# Patient Record
Sex: Male | Born: 1955 | Race: Black or African American | Hispanic: No | Marital: Single | Smoking: Former smoker
Health system: Southern US, Community
[De-identification: ages and names within clinical notes are randomized; demographics above are authoritative.]

## PROBLEM LIST (undated history)

## (undated) DIAGNOSIS — J449 Chronic obstructive pulmonary disease, unspecified: Secondary | ICD-10-CM

## (undated) HISTORY — PX: OTHER SURGICAL HISTORY: SHX169

---

## 2002-03-12 ENCOUNTER — Ambulatory Visit (HOSPITAL_COMMUNITY): Admission: RE | Admit: 2002-03-12 | Discharge: 2002-03-12 | Payer: Self-pay | Admitting: Family Medicine

## 2002-03-12 ENCOUNTER — Encounter: Payer: Self-pay | Admitting: Family Medicine

## 2002-03-26 ENCOUNTER — Ambulatory Visit (HOSPITAL_COMMUNITY): Admission: RE | Admit: 2002-03-26 | Discharge: 2002-03-26 | Payer: Self-pay | Admitting: Family Medicine

## 2002-03-26 ENCOUNTER — Encounter: Payer: Self-pay | Admitting: Family Medicine

## 2002-09-26 ENCOUNTER — Emergency Department (HOSPITAL_COMMUNITY): Admission: EM | Admit: 2002-09-26 | Discharge: 2002-09-26 | Payer: Self-pay | Admitting: Emergency Medicine

## 2008-01-09 ENCOUNTER — Emergency Department (HOSPITAL_COMMUNITY): Admission: EM | Admit: 2008-01-09 | Discharge: 2008-01-09 | Payer: Self-pay | Admitting: Emergency Medicine

## 2008-01-27 ENCOUNTER — Emergency Department (HOSPITAL_COMMUNITY): Admission: EM | Admit: 2008-01-27 | Discharge: 2008-01-27 | Payer: Self-pay | Admitting: Emergency Medicine

## 2008-06-17 IMAGING — CR DG CHEST 2V
2 series · 2 of 2 positions shown · non-contrast
Comparison: none

CLINICAL DATA: Shortness of breath, cough, congestion, smoker.  
CHEST - 2 VIEW:

[view not recorded (1 of 2)]
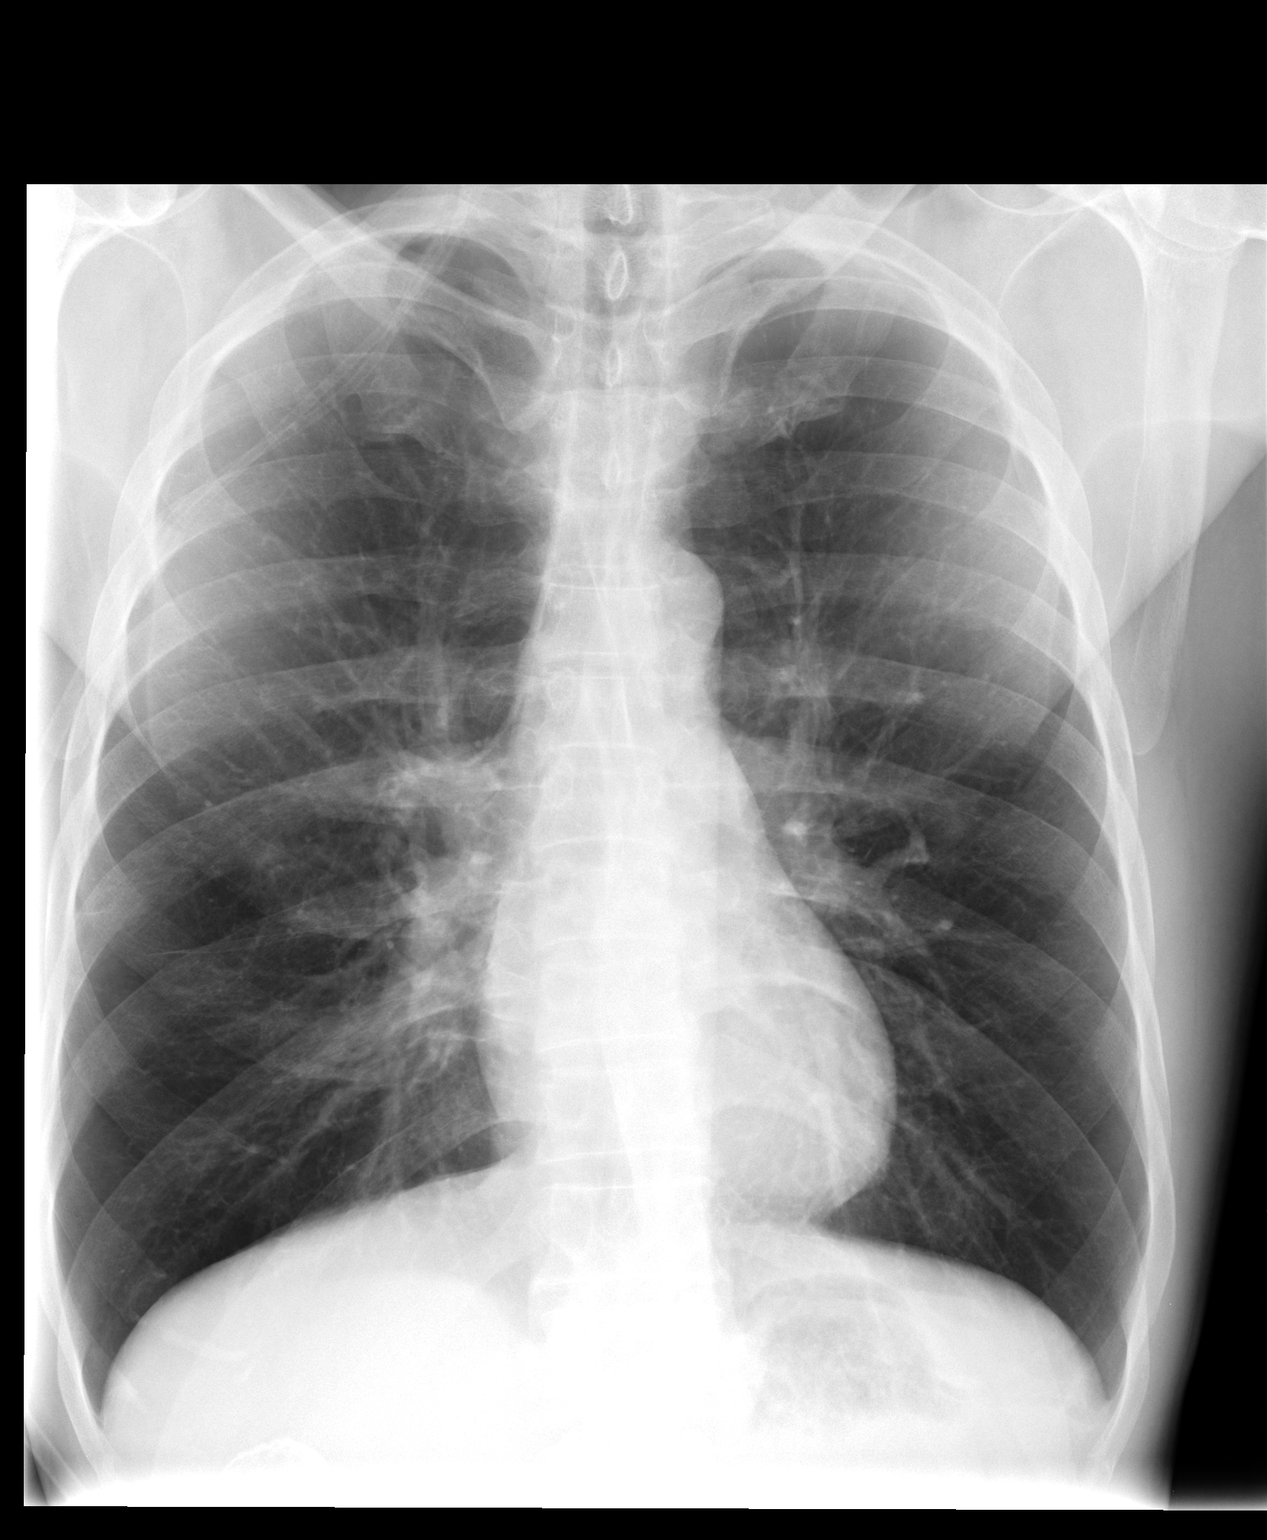

[view not recorded (2 of 2)]
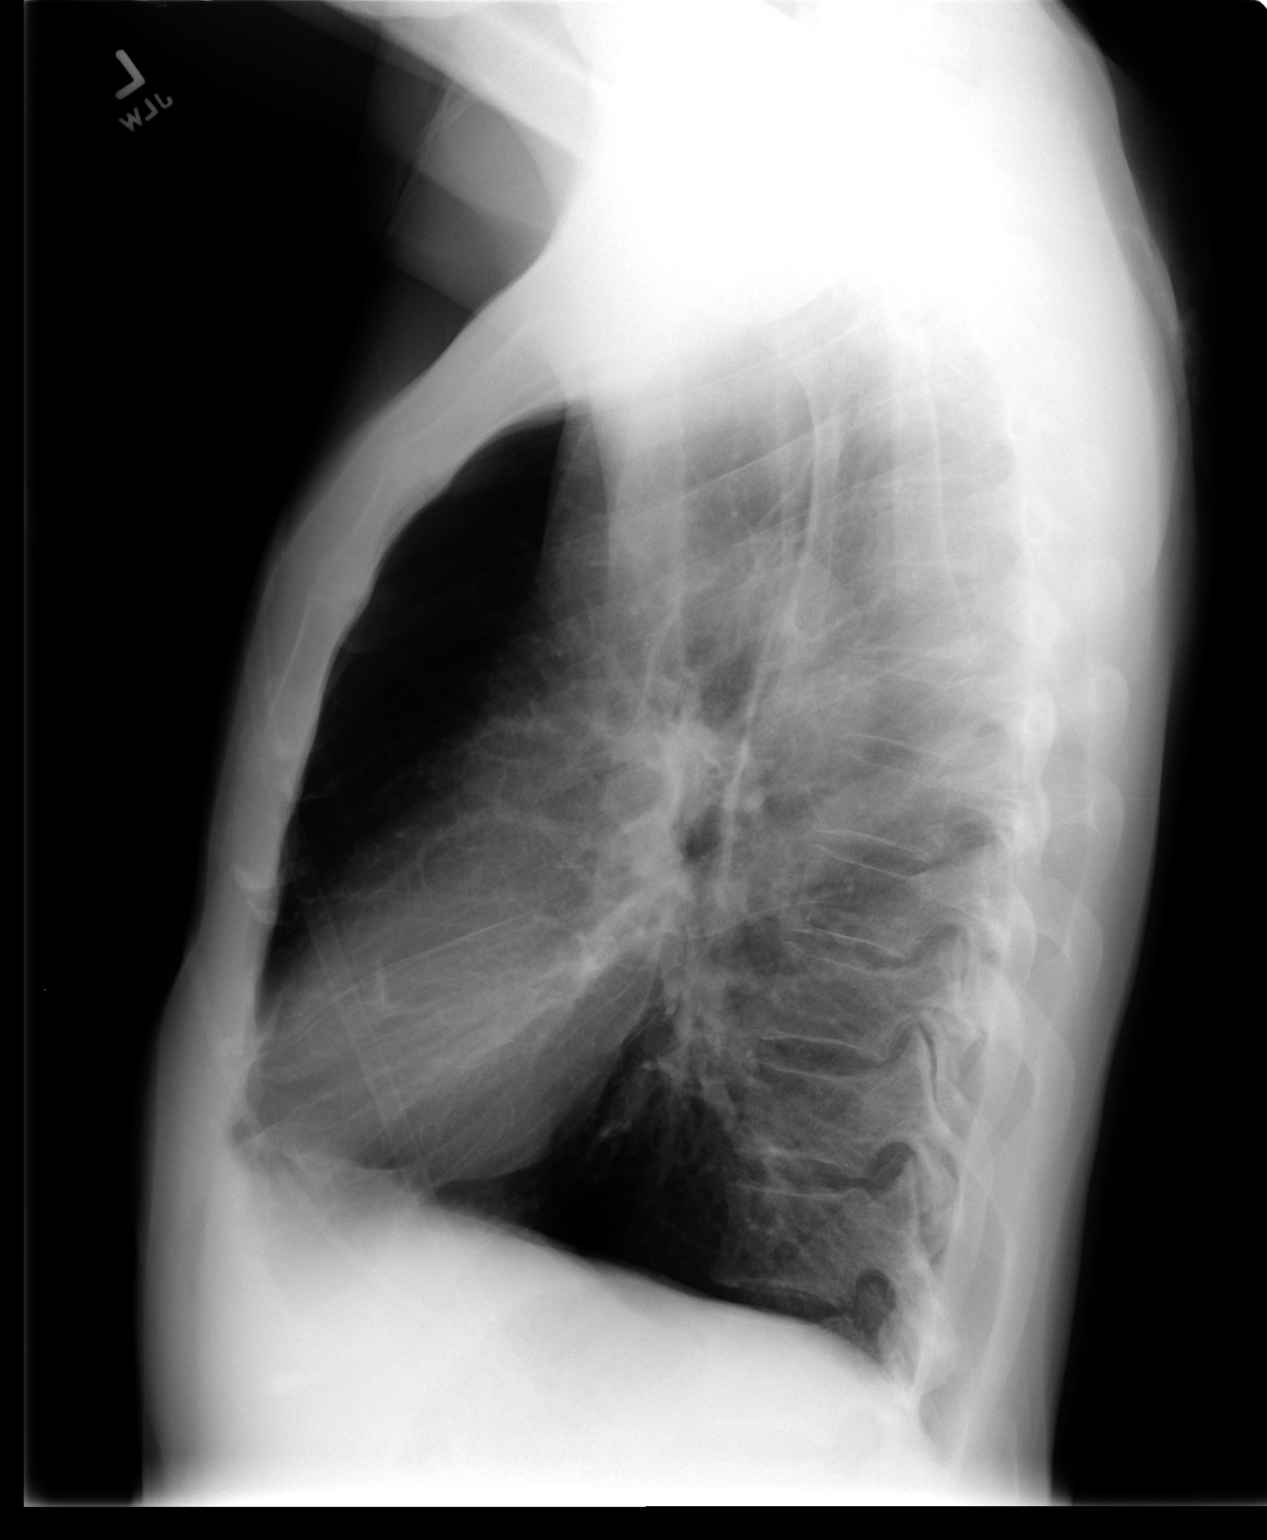

[2 of 2 positions shown; findings below may reference images not displayed]

FINDINGS: Severe hyperinflation consistent with COPD advanced for the patient?s age of 51.  Coarse peribronchial markings suggest chronic bronchitis.   I see no definite acute infiltrate, nodule, mass, effusion, pneumothorax, or edema.  The cardiac size is normal.   There is no definite adenopathy.  The bones are unremarkable.
IMPRESSION: COPD, no active disease.

## 2008-07-05 IMAGING — CR DG CHEST 1V PORT
1 series · 1 of 1 positions shown · non-contrast
Comparison: 01/09/08.

CLINICAL DATA: Short of breath.
 PORTABLE CHEST ? 01/27/08 AT 0026 HOURS:

[view not recorded]
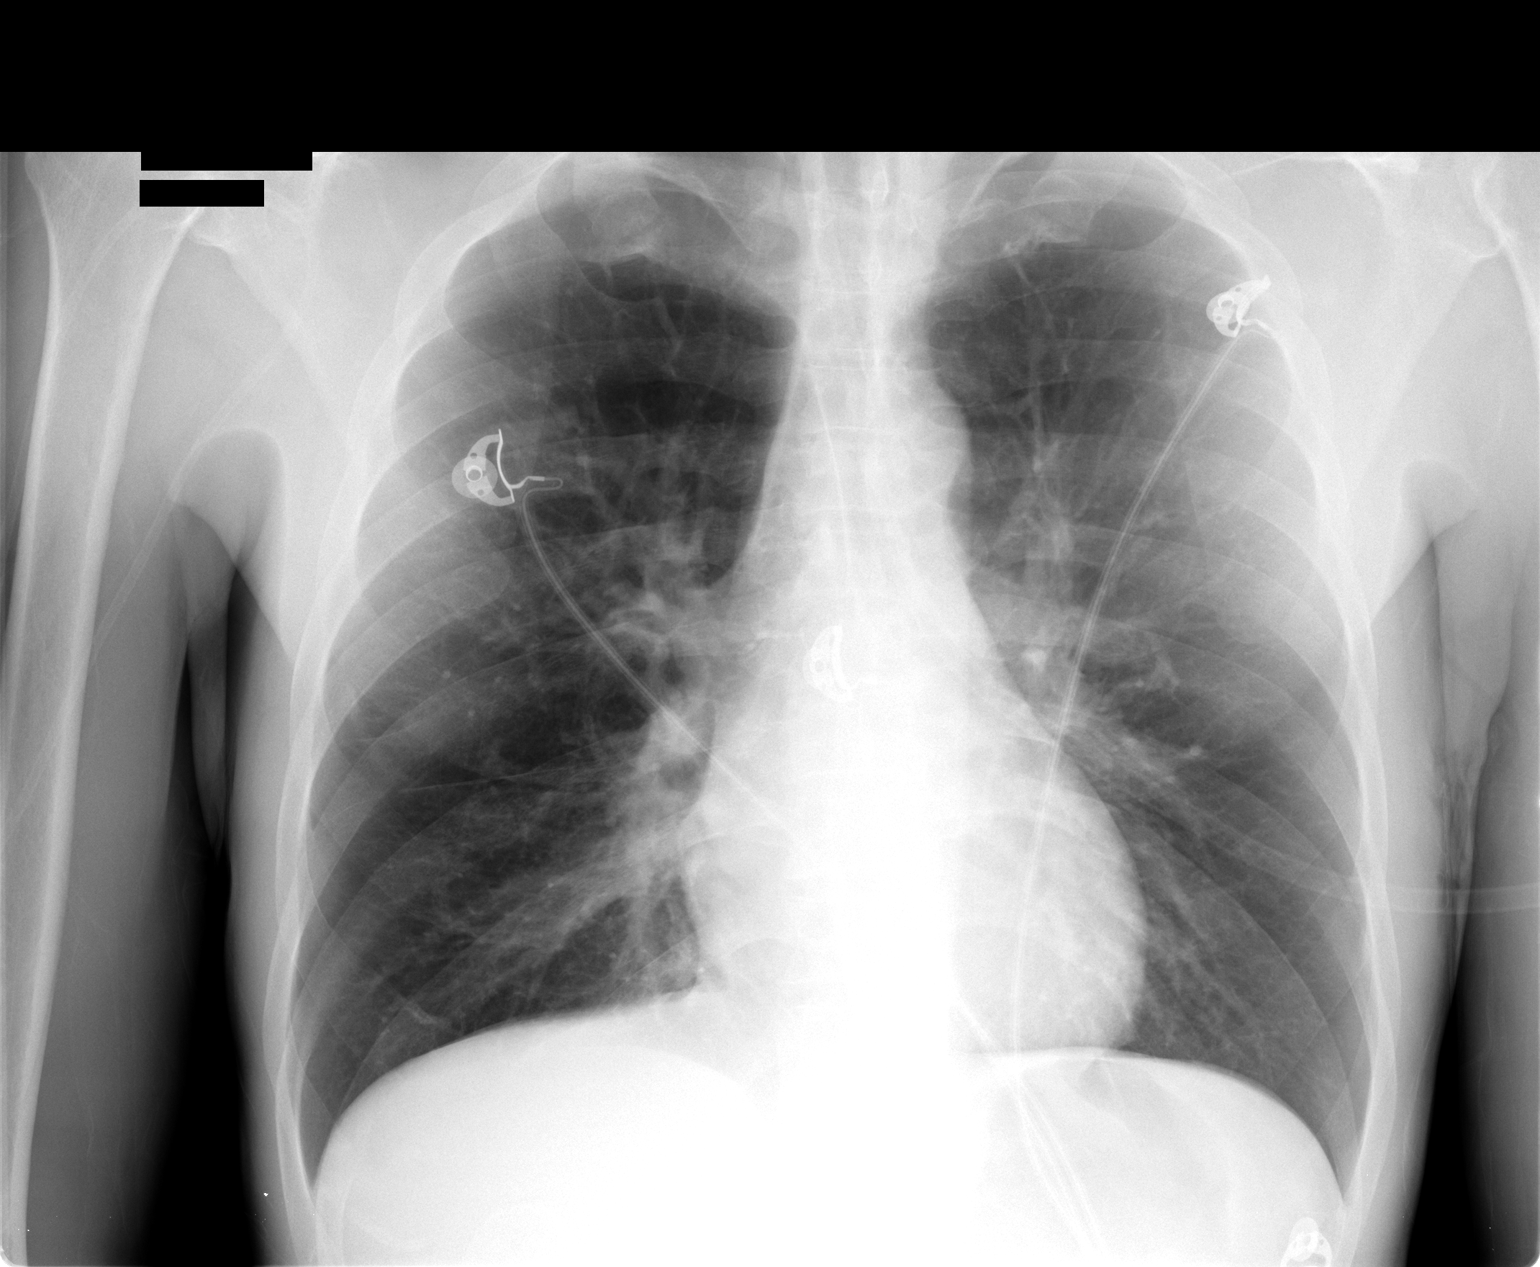

[1 of 1 positions shown; findings below may reference images not displayed]

FINDINGS: The lungs are clear but hyperaerated, consistent with COPD.  The heart is within normal limits in size.  No bony abnormality is seen.
IMPRESSION: COPD.  No active lung disease.

## 2011-09-02 LAB — DIFFERENTIAL
Basophils Absolute: 0.1
Eosinophils Relative: 3
Lymphocytes Relative: 52 — ABNORMAL HIGH
Neutrophils Relative %: 34 — ABNORMAL LOW

## 2011-09-02 LAB — CBC
HCT: 43.3
Platelets: 273
RDW: 16.1 — ABNORMAL HIGH

## 2011-09-02 LAB — BASIC METABOLIC PANEL
BUN: 8
CO2: 26
Calcium: 8.8
Chloride: 102
Creatinine, Ser: 0.91
GFR calc Af Amer: >60
GFR calc non Af Amer: >60
Glucose, Bld: 124 — ABNORMAL HIGH
Potassium: 3.3 — ABNORMAL LOW
Sodium: 136

## 2011-09-02 LAB — D-DIMER, QUANTITATIVE: D-Dimer, Quant: 0.22

## 2017-11-16 ENCOUNTER — Ambulatory Visit: Payer: BLUE CROSS/BLUE SHIELD | Admitting: General Surgery

## 2017-11-23 ENCOUNTER — Ambulatory Visit: Payer: BLUE CROSS/BLUE SHIELD | Admitting: General Surgery

## 2017-12-21 ENCOUNTER — Encounter: Payer: Self-pay | Admitting: General Surgery

## 2017-12-21 ENCOUNTER — Ambulatory Visit: Payer: BLUE CROSS/BLUE SHIELD | Admitting: General Surgery

## 2017-12-21 VITALS — BP 135/79 | HR 85 | Temp 97.3°F | Ht 69.0 in | Wt 150.0 lb

## 2017-12-21 DIAGNOSIS — D171 Benign lipomatous neoplasm of skin and subcutaneous tissue of trunk: Secondary | ICD-10-CM | POA: Diagnosis not present

## 2017-12-21 NOTE — Patient Instructions (Signed)
Lipoma A lipoma is a noncancerous (benign) tumor that is made up of fat cells. This is a very common type of soft-tissue growth. Lipomas are usually found under the skin (subcutaneous). They may occur in any tissue of the body that contains fat. Common areas for lipomas to appear include the back, shoulders, buttocks, and thighs. Lipomas grow slowly, and they are usually painless. Most lipomas do not cause problems and do not require treatment. What are the causes? The cause of this condition is not known. What increases the risk? This condition is more likely to develop in:  People who are 40-60 years old.  People who have a family history of lipomas.  What are the signs or symptoms? A lipoma usually appears as a small, round bump under the skin. It may feel soft or rubbery, but the firmness can vary. Most lipomas are not painful. However, a lipoma may become painful if it is located in an area where it pushes on nerves. How is this diagnosed? A lipoma can usually be diagnosed with a physical exam. You may also have tests to confirm the diagnosis and to rule out other conditions. Tests may include:  Imaging tests, such as a CT scan or MRI.  Removal of a tissue sample to be looked at under a microscope (biopsy).  How is this treated? Treatment is not needed for small lipomas that are not causing problems. If a lipoma continues to get bigger or it causes problems, removal is often the best option. Lipomas can also be removed to improve appearance. Removal of a lipoma is usually done with a surgery in which the fatty cells and the surrounding capsule are removed. Most often, a medicine that numbs the area (local anesthetic) is used for this procedure. Follow these instructions at home:  Keep all follow-up visits as directed by your health care provider. This is important. Contact a health care provider if:  Your lipoma becomes larger or hard.  Your lipoma becomes painful, red, or  increasingly swollen. These could be signs of infection or a more serious condition. This information is not intended to replace advice given to you by your health care provider. Make sure you discuss any questions you have with your health care provider. Document Released: 11/18/2002 Document Revised: 05/05/2016 Document Reviewed: 11/24/2014 Elsevier Interactive Patient Education  2018 [employer]  

## 2017-12-21 NOTE — Progress Notes (Signed)
Theodore [name]; [id number]; 05-31-1956   HPI  Patient is a 62 year old black male who was referred to my care by Dr. Karie Kirks for evaluation and treatment of a mass on his left buttock.  He was told that it was a lipoma.  He does not know how long it is been present.  He states he has not noticed any real increase in size since it was noted on physical examination.  He denies any pain.  He denies any discomfort.  It does not affect his daily lifestyle. History reviewed. No pertinent past medical history.  History reviewed. No pertinent surgical history.  History reviewed. No pertinent family history.  No current outpatient medications on file prior to visit.   No current facility-administered medications on file prior to visit.     Not on File  Social History   Substance and Sexual Activity  Alcohol Use Not on file    Social History   Tobacco Use  Smoking Status Former Smoker  Smokeless Tobacco Never Used    Review of Systems  Constitutional: Negative.   HENT: Negative.   Eyes: Negative.   Respiratory: Positive for cough and wheezing.   Cardiovascular: Negative.   Gastrointestinal: Negative.   Genitourinary: Negative.   Musculoskeletal: Positive for joint pain and neck pain.  Skin: Negative.   Neurological: Negative.   Endo/Heme/Allergies: Negative.   Psychiatric/Behavioral: Negative.     Objective   Vitals:   12/21/17 1002  BP: 135/79  Pulse: 85  Temp: (!) 97.3 F (36.3 C)    Physical Exam  Constitutional: He is oriented to person, place, and time and well-developed, well-nourished, and in no distress.  HENT:  Head: Normocephalic and atraumatic.  Cardiovascular: Normal rate, regular rhythm and normal heart sounds. Exam reveals no gallop and no friction rub.  No murmur heard. Pulmonary/Chest: Effort normal. No respiratory distress. He has no wheezes. He has no rales.  Neurological: He is alert and oriented to person, place, and time.  Skin: Skin is warm  and dry. No erythema.  Left buttock with 4 x 6 cm rubbery subcutaneous ovoid mass.  Nontender to touch.  No erythema or induration noted.  Vitals reviewed.   Assessment  Lipoma, left buttock, asymptomatic Plan   I told patient that no acute surgical intervention is needed at this time.  Should this become symptomatic or increase in size over a short period of time, he should return for excision.  Literature was given.  He understands and agrees.  Follow-up as needed.

## 2018-08-09 ENCOUNTER — Ambulatory Visit: Payer: BLUE CROSS/BLUE SHIELD | Admitting: General Surgery

## 2018-08-14 ENCOUNTER — Encounter: Payer: Self-pay | Admitting: General Surgery

## 2018-08-14 ENCOUNTER — Ambulatory Visit: Payer: BLUE CROSS/BLUE SHIELD | Admitting: General Surgery

## 2018-08-14 VITALS — BP 155/79 | HR 98 | Temp 96.8°F | Resp 20 | Wt 156.0 lb

## 2018-08-14 DIAGNOSIS — D171 Benign lipomatous neoplasm of skin and subcutaneous tissue of trunk: Secondary | ICD-10-CM

## 2018-08-14 NOTE — Patient Instructions (Signed)
Lipoma Removal Lipoma removal is a surgical procedure to remove a noncancerous (benign) tumor that is made up of fat cells (lipoma). Most lipomas are small and painless and do not require treatment. They can form in many areas of the body but are most common under the skin of the back, shoulders, arms, and thighs. You may need lipoma removal if you have a lipoma that is large, growing, or causing discomfort. Lipoma removal may also be done for cosmetic reasons. Tell a health care provider about:  Any allergies you have.  All medicines you are taking, including vitamins, herbs, eye drops, creams, and over-the-counter medicines.  Any problems you or family members have had with anesthetic medicines.  Any blood disorders you have.  Any surgeries you have had.  Any medical conditions you have.  Whether you are pregnant or may be pregnant. What are the risks? Generally, this is a safe procedure. However, problems may occur, including:  Infection.  Bleeding.  Allergic reactions to medicines.  Damage to nerves or blood vessels near the lipoma.  Scarring.  What happens before the procedure? Staying hydrated Follow instructions from your health care provider about hydration, which may include:  Up to 2 hours before the procedure - you may continue to drink clear liquids, such as water, clear fruit juice, black coffee, and plain tea.  Eating and drinking restrictions Follow instructions from your health care provider about eating and drinking, which may include:  8 hours before the procedure - stop eating heavy meals or foods such as meat, fried foods, or fatty foods.  6 hours before the procedure - stop eating light meals or foods, such as toast or cereal.  6 hours before the procedure - stop drinking milk or drinks that contain milk.  2 hours before the procedure - stop drinking clear liquids.  Medicines  Ask your health care provider about: ? Changing or stopping your  regular medicines. This is especially important if you are taking diabetes medicines or blood thinners. ? Taking medicines such as aspirin and ibuprofen. These medicines can thin your blood. Do not take these medicines before your procedure if your health care provider instructs you not to.  You may be given antibiotic medicine to help prevent infection. General instructions  Ask your health care provider how your surgical site will be marked or identified.  You will have a physical exam. Your health care provider will check the size of the lipoma and whether it can be moved easily.  You may have imaging tests, such as: ? X-rays. ? CT scan. ? MRI.  Plan to have someone take you home from the hospital or clinic. What happens during the procedure?  To reduce your risk of infection: ? Your health care team will wash or sanitize their hands. ? Your skin will be washed with soap.  You will be given one or more of the following: ? A medicine to help you relax (sedative). ? A medicine to numb the area (local anesthetic). ? A medicine to make you fall asleep (general anesthetic). ? A medicine that is injected into an area of your body to numb everything below the injection site (regional anesthetic).  An incision will be made over the lipoma or very near the lipoma. The incision may be made in a natural skin line or crease.  Tissues, nerves, and blood vessels near the lipoma will be moved out of the way.  The lipoma and the capsule that surrounds it will be separated  from the surrounding tissues.  The lipoma will be removed.  The incision may be closed with stitches (sutures).  A bandage (dressing) will be placed over the incision. What happens after the procedure?  Do not drive for 24 hours if you received a sedative.  Your blood pressure, heart rate, breathing rate, and blood oxygen level will be monitored until the medicines you were given have worn off. This information is not  intended to replace advice given to you by your health care provider. Make sure you discuss any questions you have with your health care provider. Document Released: 02/11/2016 Document Revised: 05/05/2016 Document Reviewed: 02/11/2016 Elsevier Interactive Patient Education  [employer].

## 2018-08-14 NOTE — Progress Notes (Signed)
Subjective:     Theodore [name]  Patient returns to schedule excision of the lipoma on his left buttock.  Is becoming more uncomfortable to sit due to the location of lipoma. Objective:    BP (!) 155/79 (BP Location: Left Arm, Patient Position: Sitting, Cuff Size: Normal)   Pulse 98   Temp (!) 96.8 F (36 C) (Temporal)   Resp 20   Wt 156 lb (70.8 kg)   BMI 23.04 kg/m   General:  alert, cooperative and no distress  Lungs clear to auscultation with good breath sounds bilaterally Heart examination reveals regular rate and rhythm without S3, S4, murmurs Left buttock with 4 x 6 cm lipoma.     Assessment:    Lipoma, left buttock    Plan:   Patient is scheduled for excision of the lipoma, left buttock on 08/27/2018.  The risks and benefits of the procedure including bleeding, infection, and the possibility of recurrence of the lipoma were fully explained to the patient, who gave informed consent.

## 2018-08-14 NOTE — H&P (Signed)
Theodore [name]; [id number]; [birth year]   HPI  Patient is a 62 year old black male who was referred to my care by Dr. Karie Kirks for evaluation and treatment of a mass on his left buttock.  He was told that it was a lipoma.  He does not know how long it is been present.  He states he has not noticed any real increase in size since it was noted on physical examination.  He denies any pain.  He denies any discomfort.  It does not affect his daily lifestyle. History reviewed. No pertinent past medical history.  History reviewed. No pertinent surgical history.  History reviewed. No pertinent family history.  No current outpatient medications on file prior to visit.   No current facility-administered medications on file prior to visit.     Not on File  Social History   Substance and Sexual Activity  Alcohol Use Not on file    Social History   Tobacco Use  Smoking Status Former Smoker  Smokeless Tobacco Never Used    Review of Systems  Constitutional: Negative.   HENT: Negative.   Eyes: Negative.   Respiratory: Positive for cough and wheezing.   Cardiovascular: Negative.   Gastrointestinal: Negative.   Genitourinary: Negative.   Musculoskeletal: Positive for joint pain and neck pain.  Skin: Negative.   Neurological: Negative.   Endo/Heme/Allergies: Negative.   Psychiatric/Behavioral: Negative.     Objective   Vitals:   12/21/17 1002  BP: 135/79  Pulse: 85  Temp: (!) 97.3 F (36.3 C)    Physical Exam  Constitutional: He is oriented to person, place, and time and well-developed, well-nourished, and in no distress.  HENT:  Head: Normocephalic and atraumatic.  Cardiovascular: Normal rate, regular rhythm and normal heart sounds. Exam reveals no gallop and no friction rub.  No murmur heard. Pulmonary/Chest: Effort normal. No respiratory distress. He has no wheezes. He has no rales.  Neurological: He is alert and oriented to person, place, and time.  Skin: Skin is warm  and dry. No erythema.  Left buttock with 4 x 6 cm rubbery subcutaneous ovoid mass.  Nontender to touch.  No erythema or induration noted.  Vitals reviewed.   Assessment  Lipoma, left buttock, asymptomatic Plan   Patient is scheduled for excision of the lipoma, left buttock on 08/27/2018.  The risks and benefits of the procedure including bleeding, infection, and recurrence of the lipoma were fully explained to the patient, who gave informed consent.

## 2018-08-17 NOTE — Patient Instructions (Signed)
Your procedure is scheduled on: 08/27/2018  Report to [facility] at   8:00  AM.  Call this number if you have problems the morning of surgery: [phone]   Remember:   Do not drink or eat food:After Midnight.  :  Take these medicines the morning of surgery with A SIP OF WATER: Albuterol neb if needed   Do not wear jewelry, make-up or nail polish.  Do not wear lotions, powders, or perfumes. You may wear deodorant.  Do not shave 48 hours prior to surgery. Men may shave face and neck.  Do not bring valuables to the hospital.  Contacts, dentures or bridgework may not be worn into surgery.  Leave suitcase in the car. After surgery it may be brought to your room.  For patients admitted to the hospital, checkout time is 11:00 AM the day of discharge.   Patients discharged the day of surgery will not be allowed to drive home.    Special Instructions: Shower using CHG night before surgery and shower the day of surgery use CHG.  Use special wash - you have one bottle of CHG for all showers.  You should use approximately 1/2 of the bottle for each shower.  Wound Care, Adult Taking care of your wound properly can help to prevent pain and infection. It can also help your wound to heal more quickly. How is this treated? Wound care  Follow instructions from your health care provider about how to take care of your wound. Make sure you: ? Wash your hands with soap and water before you change the bandage (dressing). If soap and water are not available, use hand sanitizer. ? Change your dressing as told by your health care provider. ? Leave stitches (sutures), skin glue, or adhesive strips in place. These skin closures may need to stay in place for 2 weeks or longer. If adhesive strip edges start to loosen and curl up, you may trim the loose edges. Do not remove adhesive strips completely unless your health care provider tells you to do that.  Check your wound area every day for signs of infection.  Check for: ? More redness, swelling, or pain. ? More fluid or blood. ? Warmth. ? Pus or a bad smell.  Ask your health care provider if you should clean the wound with mild soap and water. Doing this may include: ? Using a clean towel to pat the wound dry after cleaning it. Do not rub or scrub the wound. ? Applying a cream or ointment. Do this only as told by your health care provider. ? Covering the incision with a clean dressing.  Ask your health care provider when you can leave the wound uncovered. Medicines   If you were prescribed an antibiotic medicine, cream, or ointment, take or use the antibiotic as told by your health care provider. Do not stop taking or using the antibiotic even if your condition improves.  Take over-the-counter and prescription medicines only as told by your health care provider. If you were prescribed pain medicine, take it at least 30 minutes before doing any wound care or as told by your health care provider. General instructions  Return to your normal activities as told by your health care provider. Ask your health care provider what activities are safe.  Do not scratch or pick at the wound.  Keep all follow-up visits as told by your health care provider. This is important.  Eat a diet that includes protein, vitamin A, vitamin C,  and other nutrient-rich foods. These help the wound heal: ? Protein-rich foods include meat, dairy, beans, nuts, and other sources. ? Vitamin A-rich foods include carrots and dark green, leafy vegetables. ? Vitamin C-rich foods include citrus, tomatoes, and other fruits and vegetables. ? Nutrient-rich foods have protein, carbohydrates, fat, vitamins, or minerals. Eat a variety of healthy foods including vegetables, fruits, and whole grains. Contact a health care provider if:  You received a tetanus shot and you have swelling, severe pain, redness, or bleeding at the injection site.  Your pain is not controlled with  medicine.  You have more redness, swelling, or pain around the wound.  You have more fluid or blood coming from the wound.  Your wound feels warm to the touch.  You have pus or a bad smell coming from the wound.  You have a fever or chills.  You are nauseous or you vomit.  You are dizzy. Get help right away if:  You have a red streak going away from your wound.  The edges of the wound open up and separate.  Your wound is bleeding and the bleeding does not stop with gentle pressure.  You have a rash.  You faint.  You have trouble breathing. This information is not intended to replace advice given to you by your health care provider. Make sure you discuss any questions you have with your health care provider. Document Released: 09/06/2008 Document Revised: 07/27/2016 Document Reviewed: 06/14/2016 Elsevier Interactive Patient Education  2017 [place], Care After These instructions provide you with information about caring for yourself after your procedure. Your health care provider may also give you more specific instructions. Your treatment has been planned according to current medical practices, but problems sometimes occur. Call your health care provider if you have any problems or questions after your procedure. What can I expect after the procedure? After your procedure, it is common to:  Feel sleepy for several hours.  Feel clumsy and have poor balance for several hours.  Feel forgetful about what happened after the procedure.  Have poor judgment for several hours.  Feel nauseous or vomit.  Have a sore throat if you had a breathing tube during the procedure.  Follow these instructions at home: For at least 24 hours after the procedure:   Do not: ? Participate in activities in which you could fall or become injured. ? Drive. ? Use heavy machinery. ? Drink alcohol. ? Take sleeping pills or medicines that cause  drowsiness. ? Make important decisions or sign legal documents. ? Take care of children on your own.  Rest. Eating and drinking  Follow the diet that is recommended by your health care provider.  If you vomit, drink water, juice, or soup when you can drink without vomiting.  Make sure you have little or no nausea before eating solid foods. General instructions  Have a responsible adult stay with you until you are awake and alert.  Take over-the-counter and prescription medicines only as told by your health care provider.  If you smoke, do not smoke without supervision.  Keep all follow-up visits as told by your health care provider. This is important. Contact a health care provider if:  You keep feeling nauseous or you keep vomiting.  You feel light-headed.  You develop a rash.  You have a fever. Get help right away if:  You have trouble breathing. This information is not intended to replace advice given to you by your health care  provider. Make sure you discuss any questions you have with your health care provider. Document Released: 03/20/2016 Document Revised: 07/20/2016 Document Reviewed: 03/20/2016 Elsevier Interactive Patient Education  [employer].

## 2018-08-21 ENCOUNTER — Encounter (HOSPITAL_COMMUNITY): Payer: Self-pay

## 2018-08-21 ENCOUNTER — Encounter (HOSPITAL_COMMUNITY)
Admission: RE | Admit: 2018-08-21 | Discharge: 2018-08-21 | Disposition: A | Discharge status: Home / Self Care | Payer: BLUE CROSS/BLUE SHIELD | Source: Ambulatory Visit | Attending: General Surgery | Admitting: General Surgery

## 2018-08-21 DIAGNOSIS — Z01818 Encounter for other preprocedural examination: Secondary | ICD-10-CM | POA: Diagnosis present

## 2018-08-21 DIAGNOSIS — R9431 Abnormal electrocardiogram [ECG] [EKG]: Secondary | ICD-10-CM | POA: Diagnosis not present

## 2018-08-21 DIAGNOSIS — I10 Essential (primary) hypertension: Secondary | ICD-10-CM | POA: Insufficient documentation

## 2018-08-21 HISTORY — DX: Chronic obstructive pulmonary disease, unspecified: J44.9

## 2018-08-21 LAB — BASIC METABOLIC PANEL
ANION GAP: 8 (ref 5–15)
BUN: 11 mg/dL (ref 8–23)
CHLORIDE: 106 mmol/L (ref 98–111)
CO2: 25 mmol/L (ref 22–32)
Calcium: 9.1 mg/dL (ref 8.9–10.3)
Creatinine, Ser: 0.89 mg/dL (ref 0.61–1.24)
Glucose, Bld: 104 mg/dL — ABNORMAL HIGH (ref 70–99)
POTASSIUM: 3.8 mmol/L (ref 3.5–5.1)
Sodium: 139 mmol/L (ref 135–145)

## 2018-08-21 LAB — CBC
HCT: 43.9 % (ref 39.0–52.0)
HEMOGLOBIN: 14.7 g/dL (ref 13.0–17.0)
MCH: 29.9 pg (ref 26.0–34.0)
MCHC: 33.5 g/dL (ref 30.0–36.0)
MCV: 89.2 fL (ref 78.0–100.0)
PLATELETS: 243 10*3/uL (ref 150–400)
RBC: 4.92 MIL/uL (ref 4.22–5.81)
RDW: 14.5 % (ref 11.5–15.5)
WBC: 8.5 10*3/uL (ref 4.0–10.5)

## 2018-08-27 ENCOUNTER — Ambulatory Visit (HOSPITAL_COMMUNITY)
Admission: RE | Admit: 2018-08-27 | Discharge: 2018-08-27 | Disposition: A | Discharge status: Home / Self Care | Payer: BLUE CROSS/BLUE SHIELD | Source: Ambulatory Visit | Attending: General Surgery | Admitting: General Surgery

## 2018-08-27 ENCOUNTER — Ambulatory Visit (HOSPITAL_COMMUNITY): Payer: BLUE CROSS/BLUE SHIELD | Admitting: Anesthesiology

## 2018-08-27 ENCOUNTER — Encounter (HOSPITAL_COMMUNITY): Payer: Self-pay | Admitting: Anesthesiology

## 2018-08-27 ENCOUNTER — Encounter (HOSPITAL_COMMUNITY)
Admission: RE | Disposition: A | Discharge status: Home / Self Care | Payer: Self-pay | Source: Ambulatory Visit | Attending: General Surgery

## 2018-08-27 DIAGNOSIS — D171 Benign lipomatous neoplasm of skin and subcutaneous tissue of trunk: Secondary | ICD-10-CM | POA: Diagnosis not present

## 2018-08-27 DIAGNOSIS — R222 Localized swelling, mass and lump, trunk: Secondary | ICD-10-CM | POA: Diagnosis present

## 2018-08-27 DIAGNOSIS — Z87891 Personal history of nicotine dependence: Secondary | ICD-10-CM | POA: Diagnosis not present

## 2018-08-27 HISTORY — PX: MASS EXCISION: SHX2000

## 2018-08-27 SURGERY — EXCISION MASS
Anesthesia: General | Site: Buttocks | Laterality: Left

## 2018-08-27 MED ORDER — KETOROLAC TROMETHAMINE 30 MG/ML IJ SOLN
30.0000 mg | Freq: Once | INTRAMUSCULAR | Status: AC | PRN
  Administered 2018-08-27: 30 mg via INTRAVENOUS
  Filled 2018-08-27: qty 1

## 2018-08-27 MED ORDER — LIDOCAINE HCL (PF) 1 % IJ SOLN
INTRAMUSCULAR | Status: AC
Start: 2018-08-27 — End: ?
  Filled 2018-08-27: qty 5

## 2018-08-27 MED ORDER — FENTANYL CITRATE (PF) 100 MCG/2ML IJ SOLN
INTRAMUSCULAR | Status: AC
  Filled 2018-08-27: qty 2

## 2018-08-27 MED ORDER — LIDOCAINE HCL (PF) 1 % IJ SOLN
INTRAMUSCULAR | Status: DC | PRN
  Administered 2018-08-27: 6 mL

## 2018-08-27 MED ORDER — CHLORHEXIDINE GLUCONATE CLOTH 2 % EX PADS: 6.0000 | MEDICATED_PAD | Freq: Once | CUTANEOUS | Status: DC

## 2018-08-27 MED ORDER — PROPOFOL 10 MG/ML IV BOLUS
INTRAVENOUS | Status: DC | PRN
  Administered 2018-08-27: 200 mg via INTRAVENOUS

## 2018-08-27 MED ORDER — HYDROMORPHONE HCL 1 MG/ML IJ SOLN: 0.2500 mg | INTRAMUSCULAR | Status: DC | PRN

## 2018-08-27 MED ORDER — CEFAZOLIN SODIUM-DEXTROSE 2-4 GM/100ML-% IV SOLN
2.0000 g | INTRAVENOUS | Status: AC
  Administered 2018-08-27: 2 g via INTRAVENOUS

## 2018-08-27 MED ORDER — MIDAZOLAM HCL 2 MG/2ML IJ SOLN
INTRAMUSCULAR | Status: AC
  Filled 2018-08-27: qty 2

## 2018-08-27 MED ORDER — LACTATED RINGERS IV SOLN
INTRAVENOUS | Status: DC
  Administered 2018-08-27: 09:00:00 via INTRAVENOUS

## 2018-08-27 MED ORDER — PROPOFOL 10 MG/ML IV BOLUS
INTRAVENOUS | Status: AC
  Filled 2018-08-27: qty 20

## 2018-08-27 MED ORDER — FENTANYL CITRATE (PF) 100 MCG/2ML IJ SOLN
INTRAMUSCULAR | Status: DC | PRN
  Administered 2018-08-27: 50 ug via INTRAVENOUS
  Administered 2018-08-27: 25 ug via INTRAVENOUS

## 2018-08-27 MED ORDER — LIDOCAINE HCL (CARDIAC) PF 50 MG/5ML IV SOSY
PREFILLED_SYRINGE | INTRAVENOUS | Status: DC | PRN
  Administered 2018-08-27: 50 mg via INTRAVENOUS

## 2018-08-27 MED ORDER — LIDOCAINE HCL (PF) 1 % IJ SOLN
INTRAMUSCULAR | Status: AC
  Filled 2018-08-27: qty 30

## 2018-08-27 MED ORDER — HYDROCODONE-ACETAMINOPHEN 7.5-325 MG PO TABS: 1.0000 | ORAL_TABLET | Freq: Once | ORAL | Status: DC | PRN

## 2018-08-27 MED ORDER — CEFAZOLIN SODIUM-DEXTROSE 2-4 GM/100ML-% IV SOLN
INTRAVENOUS | Status: AC
  Filled 2018-08-27: qty 100

## 2018-08-27 MED ORDER — MEPERIDINE HCL 50 MG/ML IJ SOLN: 6.2500 mg | INTRAMUSCULAR | Status: DC | PRN

## 2018-08-27 MED ORDER — MIDAZOLAM HCL 5 MG/5ML IJ SOLN
INTRAMUSCULAR | Status: DC | PRN
  Administered 2018-08-27: 2 mg via INTRAVENOUS

## 2018-08-27 MED ORDER — ONDANSETRON HCL 4 MG/2ML IJ SOLN: 4.0000 mg | Freq: Once | INTRAMUSCULAR | Status: DC | PRN

## 2018-08-27 MED ORDER — SUCCINYLCHOLINE CHLORIDE 20 MG/ML IJ SOLN
INTRAMUSCULAR | Status: AC
  Filled 2018-08-27: qty 1

## 2018-08-27 MED ORDER — 0.9 % SODIUM CHLORIDE (POUR BTL) OPTIME
TOPICAL | Status: DC | PRN
  Administered 2018-08-27: 1000 mL

## 2018-08-27 SURGICAL SUPPLY — 32 items
CHLORAPREP W/TINT 10.5 ML (MISCELLANEOUS) ×3 IMPLANT
CLOTH BEACON ORANGE TIMEOUT ST (SAFETY) ×3 IMPLANT
COVER LIGHT HANDLE STERIS (MISCELLANEOUS) ×6 IMPLANT
DECANTER SPIKE VIAL GLASS SM (MISCELLANEOUS) ×3 IMPLANT
DERMABOND ADVANCED (GAUZE/BANDAGES/DRESSINGS) ×2
DERMABOND ADVANCED .7 DNX12 (GAUZE/BANDAGES/DRESSINGS) ×1 IMPLANT
ELECT NEEDLE TIP 2.8 STRL (NEEDLE) IMPLANT
ELECT REM PT RETURN 9FT ADLT (ELECTROSURGICAL) ×3
ELECTRODE REM PT RTRN 9FT ADLT (ELECTROSURGICAL) ×1 IMPLANT
GAUZE SPONGE 4X4 12PLY STRL (GAUZE/BANDAGES/DRESSINGS) ×3 IMPLANT
GLOVE BIO SURGEON STRL SZ7 (GLOVE) ×3 IMPLANT
GLOVE BIOGEL PI IND STRL 6.5 (GLOVE) ×1 IMPLANT
GLOVE BIOGEL PI IND STRL 7.0 (GLOVE) ×1 IMPLANT
GLOVE BIOGEL PI INDICATOR 6.5 (GLOVE) ×2
GLOVE BIOGEL PI INDICATOR 7.0 (GLOVE) ×2
GLOVE SURG SS PI 7.5 STRL IVOR (GLOVE) ×3 IMPLANT
GOWN STRL REUS W/ TWL XL LVL3 (GOWN DISPOSABLE) ×1 IMPLANT
GOWN STRL REUS W/TWL LRG LVL3 (GOWN DISPOSABLE) ×3 IMPLANT
GOWN STRL REUS W/TWL XL LVL3 (GOWN DISPOSABLE) ×2
KIT TURNOVER KIT A (KITS) ×3 IMPLANT
MANIFOLD NEPTUNE II (INSTRUMENTS) ×3 IMPLANT
NEEDLE HYPO 25X1 1.5 SAFETY (NEEDLE) ×3 IMPLANT
NS IRRIG 1000ML POUR BTL (IV SOLUTION) ×3 IMPLANT
PACK MINOR (CUSTOM PROCEDURE TRAY) ×3 IMPLANT
PAD ARMBOARD 7.5X6 YLW CONV (MISCELLANEOUS) ×3 IMPLANT
SET BASIN LINEN APH (SET/KITS/TRAYS/PACK) ×3 IMPLANT
SUT ETHILON 3 0 FSL (SUTURE) IMPLANT
SUT MNCRL AB 4-0 PS2 18 (SUTURE) ×3 IMPLANT
SUT PROLENE 4 0 PS 2 18 (SUTURE) IMPLANT
SUT VIC AB 3-0 SH 27 (SUTURE) ×2
SUT VIC AB 3-0 SH 27X BRD (SUTURE) ×1 IMPLANT
SYR CONTROL 10ML LL (SYRINGE) ×3 IMPLANT

## 2018-08-27 NOTE — Transfer of Care (Signed)
Immediate Anesthesia Transfer of Care Note  Patient: DEMETRIAS GOODBAR  Procedure(s) Performed: EXCISION LIPOMA LEFT BUTTOCK (Left Buttocks)  Patient Location: PACU  Anesthesia Type:General  Level of Consciousness: awake  Airway & Oxygen Therapy: Patient Spontanous Breathing  Post-op Assessment: Report given to RN and Post -op Vital signs reviewed and stable  Post vital signs: Reviewed and stable  Last Vitals:  Vitals Value Taken Time  BP    Temp    Pulse 68 08/27/2018  9:51 AM  Resp    SpO2 100 % 08/27/2018  9:51 AM  Vitals shown include unvalidated device data.  Last Pain:  Vitals:   08/27/18 0812  TempSrc: Oral  PainSc: 0-No pain      Patients Stated Pain Goal: 5 (97/94/80 1655)  Complications: No apparent anesthesia complications

## 2018-08-27 NOTE — Discharge Instructions (Signed)
Lipoma Removal, Care After  Refer to this sheet in the next few weeks. These instructions provide you with information about caring for yourself after your procedure. Your health care provider may also give you more specific instructions. Your treatment has been planned according to current medical practices, but problems sometimes occur. Call your health care provider if you have any problems or questions after your procedure.  What can I expect after the procedure?  After the procedure, it is common to have:  · Mild pain.  · Swelling.  · Bruising.    Follow these instructions at home:    Bathing  · Do not take baths, swim, or use a hot tub until your health care provider approves. Ask your health care provider if you can take showers. You may only be allowed to take sponge baths for bathing.  · Keep your bandage (dressing) dry until your health care provider says it can be removed.  Incision care    · Follow instructions from your health care provider about how to take care of your incision. Make sure you:  ? Wash your hands with soap and water before you change your bandage (dressing). If soap and water are not available, use hand sanitizer.  ? Change your dressing as told by your health care provider.  ? Leave stitches (sutures), skin glue, or adhesive strips in place. These skin closures may need to stay in place for 2 weeks or longer. If adhesive strip edges start to loosen and curl up, you may trim the loose edges. Do not remove adhesive strips completely unless your health care provider tells you to do that.  · Check your incision area every day for signs of infection. Check for:  ? More redness, swelling, or pain.  ? Fluid or blood.  ? Warmth.  ? Pus or a bad smell.  Driving  · Do not drive or operate heavy machinery while taking prescription pain medicine.  · Do not drive for 24 hours if you received a medicine to help you relax (sedative) during your procedure.  · Ask your health care provider when it is  safe for you to drive.  General instructions  · Take over-the-counter and prescription medicines only as told by your health care provider.  · Do not use any tobacco products, such as cigarettes, chewing tobacco, and e-cigarettes. These can delay healing. If you need help quitting, ask your health care provider.  · Return to your normal activities as told by your health care provider. Ask your health care provider what activities are safe for you.  · Keep all follow-up visits as told by your health care provider. This is important.  Contact a health care provider if:  · You have more redness, swelling, or pain around your incision.  · You have fluid or blood coming from your incision.  · Your incision feels warm to the touch.  · You have pus or a bad smell coming from your incision.  · You have pain that does not get better with medicine.  Get help right away if:  · You have chills or a fever.  · You have severe pain.  This information is not intended to replace advice given to you by your health care provider. Make sure you discuss any questions you have with your health care provider.  Document Released: 02/11/2016 Document Revised: 05/10/2016 Document Reviewed: 02/11/2016  Elsevier Interactive Patient Education © 2018 [employer]

## 2018-08-27 NOTE — Anesthesia Preprocedure Evaluation (Signed)
Anesthesia Evaluation  Patient identified by MRN, date of birth, ID band Patient awake    Reviewed: Allergy & Precautions, H&P , NPO status , Patient's Chart, lab work & pertinent test results  Airway Mallampati: I  TM Distance: >3 FB Neck ROM: full    Dental no notable dental hx.    Pulmonary neg pulmonary ROS, COPD, former smoker,    Pulmonary exam normal breath sounds clear to auscultation       Cardiovascular Exercise Tolerance: Good negative cardio ROS   Rhythm:regular Rate:Normal     Neuro/Psych negative neurological ROS  negative psych ROS   GI/Hepatic negative GI ROS, Neg liver ROS,   Endo/Other  negative endocrine ROS  Renal/GU negative Renal ROS  negative genitourinary   Musculoskeletal   Abdominal   Peds  Hematology negative hematology ROS (+)   Anesthesia Other Findings   Reproductive/Obstetrics negative OB ROS                             Anesthesia Physical Anesthesia Plan  ASA: II  Anesthesia Plan: General   Post-op Pain Management:    Induction:   PONV Risk Score and Plan:   Airway Management Planned:   Additional Equipment:   Intra-op Plan:   Post-operative Plan:   Informed Consent: I have reviewed the patients History and Physical, chart, labs and discussed the procedure including the risks, benefits and alternatives for the proposed anesthesia with the patient or authorized representative who has indicated his/her understanding and acceptance.   Dental Advisory Given  Plan Discussed with: CRNA  Anesthesia Plan Comments:         Anesthesia Quick Evaluation

## 2018-08-27 NOTE — Anesthesia Procedure Notes (Signed)
Procedure Name: LMA Insertion Date/Time: 08/27/2018 9:06 AM Performed by: Ollen Bowl, CRNA Pre-anesthesia Checklist: Patient identified, Patient being monitored, Emergency Drugs available, Timeout performed and Suction available Patient Re-evaluated:Patient Re-evaluated prior to induction Oxygen Delivery Method: Circle System Utilized Preoxygenation: Pre-oxygenation with 100% oxygen Induction Type: IV induction Ventilation: Mask ventilation without difficulty LMA: LMA inserted LMA Size: 4.0 Number of attempts: 1 Placement Confirmation: positive ETCO2 and breath sounds checked- equal and bilateral

## 2018-08-27 NOTE — Op Note (Signed)
Patient:  Theodore [name]  DOB:  02-Oct-1956  MRN:  [id number]   Preop Diagnosis: Soft tissue mass, left buttock  Postop Diagnosis: Same, lipoma  Procedure: Excision of greater than 5 cm subcutaneous mass, left buttock  Surgeon: Aviva Signs, MD  Anes: General  Indications: Patient is a 62 year old black male who presents with an enlarging and tender soft tissue mass in the left buttock.  The risks and benefits of the procedure including bleeding, infection, and the possibility of recurrence of the mass were fully explained to the patient, who gave informed consent.  Procedure note: The patient was placed in the right lateral decubitus position after general anesthesia was administered.  The left buttock was prepped and draped using the usual sterile technique with DuraPrep.  Surgical site confirmation was performed.  A longitudinal incision was made over the subcutaneous mass.  The dissection was taken down to the subcutaneous tissue.  A large lipoma of the soft tissue was found.  This was removed in total without difficulty.  It was sent to pathology for further examination.  Any bleeding was controlled using Bovie electrocautery.  The subcutaneous layer was reapproximated using a 3-0 Vicryl interrupted suture.  The skin was closed using a 4-0 Monocryl subcuticular suture.  1% Xylocaine was infused into the skin.  The skin incision was closed using Dermabond.  All tape and needle counts were correct at the end of the procedure.  The patient was awakened and transferred to PACU in stable condition.  Complications: None  EBL: Minimal  Specimen: Lipoma

## 2018-08-27 NOTE — Anesthesia Postprocedure Evaluation (Signed)
Anesthesia Post Note  Patient: Theodore [name]  Procedure(s) Performed: EXCISION LIPOMA LEFT BUTTOCK (Left Buttocks)  Patient location during evaluation: PACU Anesthesia Type: General Level of consciousness: awake and alert and oriented Pain management: pain level controlled Vital Signs Assessment: post-procedure vital signs reviewed and stable Respiratory status: spontaneous breathing Cardiovascular status: blood pressure returned to baseline and stable Postop Assessment: no apparent nausea or vomiting Anesthetic complications: no     Last Vitals:  Vitals:   08/27/18 1000 08/27/18 1026  BP: 121/88 (!) 156/95  Pulse: 72 65  Resp: 18   Temp:    SpO2: 100% 93%    Last Pain:  Vitals:   08/27/18 1026  TempSrc:   PainSc: 0-No pain                 August Gosser

## 2018-08-27 NOTE — Interval H&P Note (Signed)
History and Physical Interval Note:  08/27/2018 8:53 AM  Theodore [name]  has presented today for surgery, with the diagnosis of lipoma left buttock  The various methods of treatment have been discussed with the patient and family. After consideration of risks, benefits and other options for treatment, the patient has consented to  Procedure(s): EXCISION LIPOMA LEFT BUTTOCK (Left) as a surgical intervention .  The patient's history has been reviewed, patient examined, no change in status, stable for surgery.  I have reviewed the patient's chart and labs.  Questions were answered to the patient's satisfaction.     Aviva Signs

## 2018-08-28 ENCOUNTER — Encounter (HOSPITAL_COMMUNITY): Payer: Self-pay | Admitting: General Surgery

## 2018-09-04 ENCOUNTER — Ambulatory Visit (INDEPENDENT_AMBULATORY_CARE_PROVIDER_SITE_OTHER): Payer: Self-pay | Admitting: General Surgery

## 2018-09-04 ENCOUNTER — Encounter: Payer: Self-pay | Admitting: General Surgery

## 2018-09-04 VITALS — BP 142/81 | HR 87 | Temp 97.1°F | Resp 18 | Wt 153.0 lb

## 2018-09-04 DIAGNOSIS — Z09 Encounter for follow-up examination after completed treatment for conditions other than malignant neoplasm: Secondary | ICD-10-CM

## 2018-09-04 NOTE — Progress Notes (Signed)
Subjective:     Theodore [name]  Status post excision of lipoma on the left buttock.  Patient doing very well.  Has no complaints. Objective:    BP (!) 142/81 (BP Location: Left Arm, Patient Position: Sitting, Cuff Size: Normal)   Pulse 87   Temp (!) 97.1 F (36.2 C) (Temporal)   Resp 18   Wt 153 lb (69.4 kg)   BMI 22.59 kg/m   General:  alert, cooperative and no distress  Left buttock incision well-healed.  No seroma present. Final pathology consistent with diagnosis.     Assessment:    Doing well postoperatively.    Plan:   Follow-up as needed.

## 2022-03-04 ENCOUNTER — Ambulatory Visit
Admission: EM | Admit: 2022-03-04 | Discharge: 2022-03-04 | Disposition: A | Discharge status: Home / Self Care | Payer: Medicare Other | Attending: Student | Admitting: Student

## 2022-03-04 ENCOUNTER — Other Ambulatory Visit: Payer: Self-pay

## 2022-03-04 DIAGNOSIS — L03211 Cellulitis of face: Secondary | ICD-10-CM

## 2022-03-04 MED ORDER — DOXYCYCLINE HYCLATE 100 MG PO CAPS: 100.0000 mg | ORAL_CAPSULE | Freq: Two times a day (BID) | ORAL | 0 refills | Status: AC

## 2022-03-04 NOTE — ED Provider Notes (Signed)
?[place] ? ? ? ?CSN: [id number] ?Arrival date & time: 03/04/22  1026 ? ? ?  ? ?History   ?Chief Complaint ?Chief Complaint  ?Patient presents with  ? Facial Swelling  ? ? ?HPI ?Theodore [name] is a 66 y.o. male presenting with concern for right-sided facial swelling following working outside.  History noncontributory.  States he attempted Benadryl without relief.  Has also applied a warm compress and cleansed with hydrogen peroxide.  Denies fever/chills, vision changes. Does not have diabetes. ? ?HPI ? ?Past Medical History:  ?Diagnosis Date  ? COPD (chronic obstructive pulmonary disease) ([place])   ? ? ?Patient Active Problem List  ? Diagnosis Date Noted  ? Lipoma of buttock   ? ? ?Past Surgical History:  ?Procedure Laterality Date  ? excision of cyst left ankle    ? MASS EXCISION Left 08/27/2018  ? Procedure: EXCISION LIPOMA LEFT BUTTOCK;  Surgeon: Aviva Signs, MD;  Location: [facility];  Service: General;  Laterality: Left;  ? ? ? ? ? ?Home Medications   ? ?Prior to Admission medications   ?Medication Sig Start Date End Date Taking? Authorizing Provider  ?doxycycline (VIBRAMYCIN) 100 MG capsule Take 1 capsule (100 mg total) by mouth 2 (two) times daily for 7 days. 03/04/22 03/11/22 Yes Hazel Sams, PA-C  ?albuterol (PROVENTIL HFA;VENTOLIN HFA) 108 (90 Base) MCG/ACT inhaler Inhale 2 puffs into the lungs every 4 (four) hours as needed for wheezing or shortness of breath.    [provider]  ?HYDROcodone-acetaminophen (NORCO) 7.5-325 MG tablet Take 1 tablet by mouth 4 (four) times daily as needed. 07/30/18   [provider]  ?SYMBICORT 160-4.5 MCG/ACT inhaler Inhale 1 puff into the lungs 2 (two) times daily as needed. 07/17/18   [provider]  ? ? ?Family History ?History reviewed. No pertinent family history. ? ?Social History ?Social History  ? ?Tobacco Use  ? Smoking status: Former  ?  Types: Cigarettes  ?  Quit date: 2008  ?  Years since quitting: 15.2  ? Smokeless tobacco:  Never  ?Vaping Use  ? Vaping Use: Never used  ?Substance Use Topics  ? Alcohol use: Not Currently  ? Drug use: Not Currently  ? ? ? ?Allergies   ?Maalox [calcium carbonate antacid] ? ? ?Review of Systems ?Review of Systems  ?Skin:  Positive for color change.  ?All other systems reviewed and are negative. ? ? ?Physical Exam ?Triage Vital Signs ?ED Triage Vitals  ?Enc Vitals Group  ?   BP 03/04/22 1125 133/85  ?   Pulse Rate 03/04/22 1125 84  ?   Resp 03/04/22 1125 18  ?   Temp 03/04/22 1125 98.1 ?F (36.7 ?C)  ?   Temp Source 03/04/22 1125 Oral  ?   SpO2 03/04/22 1125 96 %  ?   Weight --   ?   Height --   ?   Head Circumference --   ?   Peak Flow --   ?   Pain Score 03/04/22 1128 7  ?   Pain Loc --   ?   Pain Edu? --   ?   Excl. in [place]? --   ? ?No data found. ? ?Updated Vital Signs ?BP 133/85 (BP Location: Right Arm)   Pulse 84   Temp 98.1 ?F (36.7 ?C) (Oral)   Resp 18   SpO2 96%  ? ?Visual Acuity ?Right Eye Distance:   ?Left Eye Distance:   ?Bilateral Distance:   ? ?Right  Eye Near:   ?Left Eye Near:    ?Bilateral Near:    ? ?Physical Exam ?Vitals reviewed.  ?Constitutional:   ?   General: He is not in acute distress. ?   Appearance: Normal appearance. He is not ill-appearing.  ?HENT:  ?   Head: Normocephalic and atraumatic.  ?Pulmonary:  ?   Effort: Pulmonary effort is normal.  ?Skin: ?   Comments: See image below ?R cheek with 3x3cm area of induration, mild erythema, and warmth. Minimally TTP. Lower lid with trace effusion. No upper lid changes. No lip, tongue, uvula swelling; airway is patent.  ?Neurological:  ?   General: No focal deficit present.  ?   Mental Status: He is alert and oriented to person, place, and time.  ?Psychiatric:     ?   Mood and Affect: Mood normal.     ?   Behavior: Behavior normal.     ?   Thought Content: Thought content normal.     ?   Judgment: Judgment normal.  ? ? ? ? ?UC Treatments / Results  ?Labs ?(all labs ordered are listed, but only abnormal results are displayed) ?Labs  Reviewed - No data to display ? ?EKG ? ? ?Radiology ?No results found. ? ?Procedures ?Procedures (including critical care time) ? ?Medications Ordered in UC ?Medications - No data to display ? ?Initial Impression / Assessment and Plan / UC Course  ?I have reviewed the triage vital signs and the nursing notes. ? ?Pertinent labs & imaging results that were available during my care of the patient were reviewed by me and considered in my medical decision making (see chart for details). ? ?  ? ?This patient is a very pleasant 66 y.o. year old man presenting with cellulitis R cheek. Afebrile, nontachy. Doxycycline sent. ED return precautions discussed. Patient verbalizes understanding and agreement.  ?.  ? ?Final Clinical Impressions(s) / UC Diagnoses  ? ?Final diagnoses:  ?Cellulitis of right external cheek  ? ? ? ?Discharge Instructions   ? ?  ?-Doxycycline twice daily for 7 days.  Make sure to wear sunscreen while spending time outside while on this medication as it can increase your chance of sunburn. You can take this medication with food if you have a sensitive stomach. ?-Wash your area with gentle soap and water 1-2 times daily.  Let air dry or gently pat. Avoid cleansing with hydrogen peroxide or alcohol!! ?-Seek additional medical attention if the area is getting worse instead of better- redness increasing in size, pain getting worse, new/worsening discharge, new fevers/chills, etc. ? ? ? ?ED Prescriptions   ? ? Medication Sig Dispense Auth. Provider  ? doxycycline (VIBRAMYCIN) 100 MG capsule Take 1 capsule (100 mg total) by mouth 2 (two) times daily for 7 days. 14 capsule Hazel Sams, PA-C  ? ?  ? ?PDMP not reviewed this encounter. ?  ?Hazel Sams, PA-C ?03/04/22 1237 ? ?

## 2022-03-04 NOTE — ED Triage Notes (Signed)
Pt states about 3 days ago the right side of his face and below both eyes are swollen from working outside ? ?Pt states he tried Benadryl to stop the itching under his eyes ? ?Denies fever ?

## 2022-03-04 NOTE — Discharge Instructions (Addendum)
-  Doxycycline twice daily for 7 days.  Make sure to wear sunscreen while spending time outside while on this medication as it can increase your chance of sunburn. You can take this medication with food if you have a sensitive stomach. ?-Wash your area with gentle soap and water 1-2 times daily.  Let air dry or gently pat. Avoid cleansing with hydrogen peroxide or alcohol!! ?-Seek additional medical attention if the area is getting worse instead of better- redness increasing in size, pain getting worse, new/worsening discharge, new fevers/chills, etc. ? ?

## 2023-03-15 ENCOUNTER — Ambulatory Visit (HOSPITAL_COMMUNITY)
Admission: RE | Admit: 2023-03-15 | Discharge: 2023-03-15 | Disposition: A | Discharge status: Home / Self Care | Payer: Medicare Other | Source: Ambulatory Visit | Attending: Family Medicine | Admitting: Family Medicine

## 2023-03-15 ENCOUNTER — Other Ambulatory Visit (HOSPITAL_COMMUNITY): Payer: Self-pay | Admitting: Family Medicine

## 2023-03-15 DIAGNOSIS — R0789 Other chest pain: Secondary | ICD-10-CM | POA: Diagnosis not present

## 2023-03-17 ENCOUNTER — Other Ambulatory Visit (HOSPITAL_COMMUNITY): Payer: Self-pay | Admitting: Family Medicine

## 2023-03-17 ENCOUNTER — Ambulatory Visit (HOSPITAL_COMMUNITY)
Admission: RE | Admit: 2023-03-17 | Discharge: 2023-03-17 | Disposition: A | Discharge status: Home / Self Care | Payer: Medicare Other | Source: Ambulatory Visit | Attending: Family Medicine | Admitting: Family Medicine

## 2023-03-17 ENCOUNTER — Encounter (HOSPITAL_COMMUNITY): Payer: Self-pay

## 2023-03-17 DIAGNOSIS — J90 Pleural effusion, not elsewhere classified: Secondary | ICD-10-CM | POA: Insufficient documentation

## 2023-03-17 DIAGNOSIS — I700 Atherosclerosis of aorta: Secondary | ICD-10-CM | POA: Diagnosis not present

## 2023-03-17 DIAGNOSIS — Z1289 Encounter for screening for malignant neoplasm of other sites: Secondary | ICD-10-CM | POA: Diagnosis present

## 2023-03-17 DIAGNOSIS — K769 Liver disease, unspecified: Secondary | ICD-10-CM | POA: Insufficient documentation

## 2023-03-17 DIAGNOSIS — J439 Emphysema, unspecified: Secondary | ICD-10-CM | POA: Diagnosis not present

## 2023-03-17 MED ORDER — IOHEXOL 300 MG/ML  SOLN
100.0000 mL | Freq: Once | INTRAMUSCULAR | Status: AC | PRN
  Administered 2023-03-17: 75 mL via INTRAVENOUS

## 2023-03-27 ENCOUNTER — Other Ambulatory Visit: Payer: Self-pay

## 2023-03-27 DIAGNOSIS — C3490 Malignant neoplasm of unspecified part of unspecified bronchus or lung: Secondary | ICD-10-CM

## 2023-03-27 NOTE — Progress Notes (Signed)
PET scan and brain MRI ordered per Dr. Ellin Saba

## 2023-03-30 ENCOUNTER — Other Ambulatory Visit: Payer: Self-pay

## 2023-03-30 ENCOUNTER — Emergency Department (HOSPITAL_COMMUNITY)
Admission: EM | Admit: 2023-03-30 | Discharge: 2023-03-30 | Disposition: A | Discharge status: Home / Self Care | Payer: Medicare Other | Attending: Emergency Medicine | Admitting: Emergency Medicine

## 2023-03-30 ENCOUNTER — Other Ambulatory Visit: Payer: Self-pay | Admitting: Hematology

## 2023-03-30 ENCOUNTER — Encounter (HOSPITAL_COMMUNITY): Payer: Self-pay | Admitting: Emergency Medicine

## 2023-03-30 ENCOUNTER — Other Ambulatory Visit: Payer: Medicare Other

## 2023-03-30 ENCOUNTER — Emergency Department (HOSPITAL_COMMUNITY)
Admission: RE | Admit: 2023-03-30 | Discharge: 2023-03-30 | Disposition: A | Discharge status: Home / Self Care | Payer: Medicare Other | Source: Ambulatory Visit | Attending: Hematology | Admitting: Hematology

## 2023-03-30 DIAGNOSIS — C799 Secondary malignant neoplasm of unspecified site: Secondary | ICD-10-CM | POA: Insufficient documentation

## 2023-03-30 DIAGNOSIS — G9389 Other specified disorders of brain: Secondary | ICD-10-CM | POA: Insufficient documentation

## 2023-03-30 DIAGNOSIS — C719 Malignant neoplasm of brain, unspecified: Secondary | ICD-10-CM | POA: Insufficient documentation

## 2023-03-30 DIAGNOSIS — Z85118 Personal history of other malignant neoplasm of bronchus and lung: Secondary | ICD-10-CM | POA: Insufficient documentation

## 2023-03-30 DIAGNOSIS — R9402 Abnormal brain scan: Secondary | ICD-10-CM | POA: Diagnosis present

## 2023-03-30 DIAGNOSIS — C3490 Malignant neoplasm of unspecified part of unspecified bronchus or lung: Secondary | ICD-10-CM

## 2023-03-30 MED ORDER — GADOBUTROL 1 MMOL/ML IV SOLN
7.0000 mL | Freq: Once | INTRAVENOUS | Status: AC | PRN
  Administered 2023-03-30: 7 mL via INTRAVENOUS

## 2023-03-30 NOTE — Discharge Instructions (Signed)
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  Follow-up with your oncologist to discuss further treatment.  Return to the emergency room if you start having symptoms of severe headache, confusion, numbness or weakness

## 2023-03-30 NOTE — ED Provider Notes (Signed)
[place] EMERGENCY DEPARTMENT AT [facility] Provider Note   CSN: [id number] Arrival date & time: 03/30/23  1025     History  Chief complaint: abnormal MRI  Theodore [name] is a 67 y.o. male.  HPI   Patient has a history of lung cancer.  This was a recent diagnosis earlier this month.  Patient had an abnormal outpatient chest x-ray.  A follow-up CT scan with contrast was ordered by his primary care doctor.  It ultimately showed a right lobe mass.  Patient was also noted to have lytic lesions in the scapula.  He also had lesions noted in the liver.  Patient was referred to oncology Dr. Ellin Saba.  Patient states he had an outpatient MRI performed today.  The MRI technician noted findings that were concerning for a stroke.  Patient was then sent to the ED for further evaluation.  Patient denies any trouble with headaches.  No confusion.  No slurred speech.  No numbness or weakness.  He denies any specific complaints today at all  Home Medications Prior to Admission medications   Medication Sig Start Date End Date Taking? Authorizing Provider  albuterol (PROVENTIL HFA;VENTOLIN HFA) 108 (90 Base) MCG/ACT inhaler Inhale 2 puffs into the lungs every 4 (four) hours as needed for wheezing or shortness of breath.   Yes [provider]  HYDROcodone-acetaminophen (NORCO) 7.5-325 MG tablet Take 1 tablet by mouth 4 (four) times daily as needed. 07/30/18  Yes [provider]  SYMBICORT 160-4.5 MCG/ACT inhaler Inhale 1 puff into the lungs 2 (two) times daily as needed. 07/17/18  Yes [provider]      Allergies    Maalox [calcium carbonate antacid]    Review of Systems   Review of Systems  Physical Exam Updated Vital Signs BP (!) 138/90 (BP Location: Right Arm)   Pulse 87   Temp 98.5 F (36.9 C) (Oral)   Resp (!) 22   Ht 1.753 m ( )   Wt 61.2 kg   SpO2 97%   BMI 19.94 kg/m  Physical Exam Vitals and nursing note reviewed.  Constitutional:       General: He is not in acute distress.    Appearance: He is well-developed.  HENT:     Head: Normocephalic and atraumatic.     Right Ear: External ear normal.     Left Ear: External ear normal.  Eyes:     General: No visual field deficit or scleral icterus.       Right eye: No discharge.        Left eye: No discharge.     Conjunctiva/sclera: Conjunctivae normal.  Neck:     Trachea: No tracheal deviation.  Cardiovascular:     Rate and Rhythm: Normal rate and regular rhythm.  Pulmonary:     Effort: Pulmonary effort is normal. No respiratory distress.     Breath sounds: Normal breath sounds. No stridor. No wheezing or rales.  Abdominal:     General: Bowel sounds are normal. There is no distension.     Palpations: Abdomen is soft.     Tenderness: There is no abdominal tenderness. There is no guarding or rebound.  Musculoskeletal:        General: No tenderness.     Cervical back: Neck supple.  Skin:    General: Skin is warm and dry.     Findings: No rash.  Neurological:     Mental Status: He is alert and oriented to person, place, and  time.     Cranial Nerves: No cranial nerve deficit, dysarthria or facial asymmetry.     Sensory: No sensory deficit.     Motor: No abnormal muscle tone, seizure activity or pronator drift.     Coordination: Coordination normal.     Comments:  able to hold both legs off bed for 5 seconds, sensation intact in all extremities,  no left or right sided neglect, normal finger-nose exam bilaterally, no nystagmus noted   Psychiatric:        Mood and Affect: Mood normal.     ED Results / Procedures / Treatments   Labs (all labs ordered are listed, but only abnormal results are displayed) Labs Reviewed - No data to display  EKG None  Radiology MR Brain W Wo Contrast  Result Date: 03/30/2023 CLINICAL DATA:  Non-small cell lung cancer (NSCLC), staging EXAM: MRI HEAD WITHOUT AND WITH CONTRAST MRA HEAD WITHOUT CONTRAST TECHNIQUE: Multiplanar, multi-echo  pulse sequences of the brain and surrounding structures were acquired without and with intravenous contrast. Angiographic images of the Circle of Willis were acquired using MRA technique without intravenous contrast. CONTRAST:  7mL GADAVIST GADOBUTROL 1 MMOL/ML IV SOLN COMPARISON:  None Available. FINDINGS: MRI HEAD FINDINGS Brain: Findings compatible with metastatic disease with multiple peripherally enhancing lesions. This includes a peripherally enhancing 1.5 x 1.7 cm lesion in the left frontal lobe (series 23, image 105). Complex peripherally enhancing 1.5 cm lesion more superiorly along the high left frontal lobe near the vertex (image 124). Approximately 5 mm peripherally enhancing lesion in the right frontal lobe (image 116). Probable peripherally enhancing 5 mm lesion in the right occipital lobe (image 51) and in the posterior left temporal lobe (image 53). Approximately 8 mm enhancing lesion in the left parietal lobe (series 23, image 108). Possible punctate lesion in the right frontal operculum (image 83) and parasagittal right frontal lobe (image 117). Ill-defined enhancing lesion in the right temporal lobe (image 71). Possible punctate enhancing lesion in the superior cerebellum (image 53). No midline shift, acute hemorrhage, acute infarct or hydrocephalus. Vascular: Major arterial flow voids are maintained at the skull base. Skull and upper cervical spine: Normal marrow signal. Sinuses/Orbits: Paranasal sinus mucosal thickening. No acute orbital findings. Other: No mastoid effusions. MRA HEAD FINDINGS Anterior circulation: Bilateral intracranial ICAs, MCAs, and ACAs are patent without proximal high-grade stenosis. Posterior circulation: Bilateral intradural vertebral arteries, basilar artery and bilateral posterior cerebral arteries are patent without proximal hemodynamically significant stenosis. IMPRESSION: MRI: Many enhancing lesions in the brain, detailed above and compatible with metastatic  disease. The dominant/largest lesions are in the left frontal lobe. MRA: No large vessel occlusion or proximal high-grade stenosis. Electronically Signed   By: Feliberto Harts M.D.   On: 03/30/2023 10:58   MR ANGIO HEAD WO CONTRAST  Result Date: 03/30/2023 CLINICAL DATA:  Non-small cell lung cancer (NSCLC), staging EXAM: MRI HEAD WITHOUT AND WITH CONTRAST MRA HEAD WITHOUT CONTRAST TECHNIQUE: Multiplanar, multi-echo pulse sequences of the brain and surrounding structures were acquired without and with intravenous contrast. Angiographic images of the Circle of Willis were acquired using MRA technique without intravenous contrast. CONTRAST:  7mL GADAVIST GADOBUTROL 1 MMOL/ML IV SOLN COMPARISON:  None Available. FINDINGS: MRI HEAD FINDINGS Brain: Findings compatible with metastatic disease with multiple peripherally enhancing lesions. This includes a peripherally enhancing 1.5 x 1.7 cm lesion in the left frontal lobe (series 23, image 105). Complex peripherally enhancing 1.5 cm lesion more superiorly along the high left frontal lobe near the vertex (image  124). Approximately 5 mm peripherally enhancing lesion in the right frontal lobe (image 116). Probable peripherally enhancing 5 mm lesion in the right occipital lobe (image 51) and in the posterior left temporal lobe (image 53). Approximately 8 mm enhancing lesion in the left parietal lobe (series 23, image 108). Possible punctate lesion in the right frontal operculum (image 83) and parasagittal right frontal lobe (image 117). Ill-defined enhancing lesion in the right temporal lobe (image 71). Possible punctate enhancing lesion in the superior cerebellum (image 53). No midline shift, acute hemorrhage, acute infarct or hydrocephalus. Vascular: Major arterial flow voids are maintained at the skull base. Skull and upper cervical spine: Normal marrow signal. Sinuses/Orbits: Paranasal sinus mucosal thickening. No acute orbital findings. Other: No mastoid effusions.  MRA HEAD FINDINGS Anterior circulation: Bilateral intracranial ICAs, MCAs, and ACAs are patent without proximal high-grade stenosis. Posterior circulation: Bilateral intradural vertebral arteries, basilar artery and bilateral posterior cerebral arteries are patent without proximal hemodynamically significant stenosis. IMPRESSION: MRI: Many enhancing lesions in the brain, detailed above and compatible with metastatic disease. The dominant/largest lesions are in the left frontal lobe. MRA: No large vessel occlusion or proximal high-grade stenosis. Electronically Signed   By: Feliberto Harts M.D.   On: 03/30/2023 10:58    Procedures Procedures    Medications Ordered in ED Medications - No data to display  ED Course/ Medical Decision Making/ A&P Clinical Course as of 03/30/23 1214  Thu Mar 30, 2023  1120 I reviewed the patient's MRI and MRA that he had earlier today.  He does not have any evidence of an acute stroke.  He has evidence of metastatic disease associated with his lung cancer. [JK]    Clinical Course User Index [JK] Linwood Dibbles, MD                             Medical Decision Making Problems Addressed: Metastatic malignant neoplasm, unspecified site: chronic illness or injury with exacerbation, progression, or side effects of treatment  Amount and/or Complexity of Data Reviewed Radiology: independent interpretation performed.   Patient was sent to the ED because of abnormal MRI findings.  Patient was undergoing an MRI ordered by his oncologist Dr. Ellin Saba for workup as part of his newly diagnosed lung cancer evaluation..  The MRI technician reviewed the films and thought the patient had an acute stroke.  The patient was then sent to the ED for further evaluation.  Patient is asymptomatic and denies any acute complaints.  The formal reading by the radiologist however indicates that the patient has metastatic brain lesions associated with his lung cancer and not an acute stroke.   Discussed the case with Dr. Ellin Saba.  Patient is not having any symptoms and there is no edema.  Will follow-up with him in his office        Final Clinical Impression(s) / ED Diagnoses Final diagnoses:  Metastatic malignant neoplasm, unspecified site    Rx / DC Orders ED Discharge Orders     None         Linwood Dibbles, MD 03/30/23 1234

## 2023-03-30 NOTE — ED Triage Notes (Signed)
Pt was in MRI brain for stroke like symptoms. MRI showed stroke. Radiology sent him over for a work up

## 2023-03-31 ENCOUNTER — Other Ambulatory Visit: Payer: Self-pay

## 2023-03-31 DIAGNOSIS — C3490 Malignant neoplasm of unspecified part of unspecified bronchus or lung: Secondary | ICD-10-CM

## 2023-03-31 NOTE — Progress Notes (Signed)
Theodore Overlie, MD  Leodis Rains D PROCEDURE / BIOPSY REVIEW Date: 03/31/23  Requested Biopsy site: Lung biopsy requested BUT schedule CT guided right scapula lesion biopsy Reason for request: Metastatic disease Imaging review: Best seen on Chest CT, image 17, se 2  Decision: Approved Imaging modality to perform: Ultrasound and CT Schedule with: Moderate Sedation Schedule for: Any VIR  Additional comments:   Please contact me with questions, concerns, or if issue pertaining to this request arise.  Arn Medal, MD [facility] Radiology

## 2023-03-31 NOTE — Progress Notes (Signed)
Biopsy order placed per Dr. Katragadda 

## 2023-04-06 ENCOUNTER — Ambulatory Visit (HOSPITAL_COMMUNITY)
Admission: RE | Admit: 2023-04-06 | Discharge: 2023-04-06 | Disposition: A | Discharge status: Home / Self Care | Payer: Medicare Other | Source: Ambulatory Visit | Attending: Hematology | Admitting: Hematology

## 2023-04-06 DIAGNOSIS — C3490 Malignant neoplasm of unspecified part of unspecified bronchus or lung: Secondary | ICD-10-CM | POA: Insufficient documentation

## 2023-04-06 MED ORDER — FLUDEOXYGLUCOSE F - 18 (FDG) INJECTION
6.5000 | Freq: Once | INTRAVENOUS | Status: AC | PRN
  Administered 2023-04-06: 6.5 via INTRAVENOUS

## 2023-04-10 ENCOUNTER — Other Ambulatory Visit: Payer: Self-pay

## 2023-04-10 ENCOUNTER — Inpatient Hospital Stay (HOSPITAL_COMMUNITY)
Admission: EM | Admit: 2023-04-10 | Discharge: 2023-04-19 | DRG: 166 | Disposition: A | Discharge status: Home / Self Care | Payer: Medicare Other | Source: Ambulatory Visit | Attending: Family Medicine | Admitting: Family Medicine

## 2023-04-10 ENCOUNTER — Emergency Department (HOSPITAL_COMMUNITY): Payer: Medicare Other

## 2023-04-10 ENCOUNTER — Encounter (HOSPITAL_COMMUNITY): Payer: Self-pay | Admitting: *Deleted

## 2023-04-10 ENCOUNTER — Other Ambulatory Visit: Payer: Self-pay | Admitting: Hematology

## 2023-04-10 DIAGNOSIS — M5431 Sciatica, right side: Secondary | ICD-10-CM | POA: Diagnosis not present

## 2023-04-10 DIAGNOSIS — Z681 Body mass index (BMI) 19 or less, adult: Secondary | ICD-10-CM | POA: Diagnosis not present

## 2023-04-10 DIAGNOSIS — Z79899 Other long term (current) drug therapy: Secondary | ICD-10-CM | POA: Diagnosis not present

## 2023-04-10 DIAGNOSIS — J9383 Other pneumothorax: Secondary | ICD-10-CM | POA: Diagnosis not present

## 2023-04-10 DIAGNOSIS — Z1152 Encounter for screening for COVID-19: Secondary | ICD-10-CM

## 2023-04-10 DIAGNOSIS — D72828 Other elevated white blood cell count: Secondary | ICD-10-CM | POA: Diagnosis present

## 2023-04-10 DIAGNOSIS — J948 Other specified pleural conditions: Principal | ICD-10-CM | POA: Diagnosis present

## 2023-04-10 DIAGNOSIS — C7951 Secondary malignant neoplasm of bone: Secondary | ICD-10-CM | POA: Diagnosis present

## 2023-04-10 DIAGNOSIS — Z87891 Personal history of nicotine dependence: Secondary | ICD-10-CM

## 2023-04-10 DIAGNOSIS — G4700 Insomnia, unspecified: Secondary | ICD-10-CM | POA: Insufficient documentation

## 2023-04-10 DIAGNOSIS — C7931 Secondary malignant neoplasm of brain: Secondary | ICD-10-CM | POA: Diagnosis not present

## 2023-04-10 DIAGNOSIS — Z66 Do not resuscitate: Secondary | ICD-10-CM | POA: Diagnosis present

## 2023-04-10 DIAGNOSIS — C771 Secondary and unspecified malignant neoplasm of intrathoracic lymph nodes: Secondary | ICD-10-CM | POA: Diagnosis not present

## 2023-04-10 DIAGNOSIS — F5101 Primary insomnia: Secondary | ICD-10-CM

## 2023-04-10 DIAGNOSIS — C342 Malignant neoplasm of middle lobe, bronchus or lung: Principal | ICD-10-CM | POA: Diagnosis present

## 2023-04-10 DIAGNOSIS — E43 Unspecified severe protein-calorie malnutrition: Secondary | ICD-10-CM | POA: Diagnosis not present

## 2023-04-10 DIAGNOSIS — Z7952 Long term (current) use of systemic steroids: Secondary | ICD-10-CM

## 2023-04-10 DIAGNOSIS — R918 Other nonspecific abnormal finding of lung field: Secondary | ICD-10-CM

## 2023-04-10 DIAGNOSIS — I952 Hypotension due to drugs: Secondary | ICD-10-CM | POA: Diagnosis not present

## 2023-04-10 DIAGNOSIS — Z23 Encounter for immunization: Secondary | ICD-10-CM

## 2023-04-10 DIAGNOSIS — C3491 Malignant neoplasm of unspecified part of right bronchus or lung: Secondary | ICD-10-CM

## 2023-04-10 DIAGNOSIS — C772 Secondary and unspecified malignant neoplasm of intra-abdominal lymph nodes: Secondary | ICD-10-CM | POA: Diagnosis present

## 2023-04-10 DIAGNOSIS — J449 Chronic obstructive pulmonary disease, unspecified: Secondary | ICD-10-CM | POA: Diagnosis not present

## 2023-04-10 DIAGNOSIS — T450X5A Adverse effect of antiallergic and antiemetic drugs, initial encounter: Secondary | ICD-10-CM | POA: Diagnosis not present

## 2023-04-10 DIAGNOSIS — T40695A Adverse effect of other narcotics, initial encounter: Secondary | ICD-10-CM | POA: Diagnosis not present

## 2023-04-10 DIAGNOSIS — Z4682 Encounter for fitting and adjustment of non-vascular catheter: Secondary | ICD-10-CM

## 2023-04-10 DIAGNOSIS — Z7951 Long term (current) use of inhaled steroids: Secondary | ICD-10-CM

## 2023-04-10 DIAGNOSIS — J9811 Atelectasis: Secondary | ICD-10-CM | POA: Diagnosis present

## 2023-04-10 DIAGNOSIS — J90 Pleural effusion, not elsewhere classified: Secondary | ICD-10-CM

## 2023-04-10 DIAGNOSIS — C3490 Malignant neoplasm of unspecified part of unspecified bronchus or lung: Secondary | ICD-10-CM | POA: Insufficient documentation

## 2023-04-10 LAB — TROPONIN I (HIGH SENSITIVITY): Troponin I (High Sensitivity): <2 ng/L (ref <18)

## 2023-04-10 LAB — BASIC METABOLIC PANEL
Anion gap: 10 (ref 5–15)
BUN: 16 mg/dL (ref 8–23)
CO2: 24 mmol/L (ref 22–32)
Calcium: 9 mg/dL (ref 8.9–10.3)
Chloride: 101 mmol/L (ref 98–111)
Creatinine, Ser: 0.79 mg/dL (ref 0.61–1.24)
GFR, Estimated: >60 mL/min (ref >60)
Glucose, Bld: 139 mg/dL — ABNORMAL HIGH (ref 70–99)
Potassium: 3.7 mmol/L (ref 3.5–5.1)
Sodium: 135 mmol/L (ref 135–145)

## 2023-04-10 LAB — CBC WITH DIFFERENTIAL/PLATELET
Abs Immature Granulocytes: 0.06 10*3/uL (ref 0.00–0.07)
Basophils Absolute: 0 10*3/uL (ref 0.0–0.1)
Basophils Relative: 0 %
Eosinophils Absolute: 0.2 10*3/uL (ref 0.0–0.5)
Eosinophils Relative: 2 %
HCT: 47.2 % (ref 39.0–52.0)
Hemoglobin: 15.4 g/dL (ref 13.0–17.0)
Immature Granulocytes: 0 %
Lymphocytes Relative: 9 %
Lymphs Abs: 1.2 10*3/uL (ref 0.7–4.0)
MCH: 28.1 pg (ref 26.0–34.0)
MCHC: 32.6 g/dL (ref 30.0–36.0)
MCV: 86 fL (ref 80.0–100.0)
Monocytes Absolute: 0.7 10*3/uL (ref 0.1–1.0)
Monocytes Relative: 5 %
Neutro Abs: 11.9 10*3/uL — ABNORMAL HIGH (ref 1.7–7.7)
Neutrophils Relative %: 84 %
Platelets: 358 10*3/uL (ref 150–400)
RBC: 5.49 MIL/uL (ref 4.22–5.81)
RDW: 15.3 % (ref 11.5–15.5)
WBC: 14.1 10*3/uL — ABNORMAL HIGH (ref 4.0–10.5)
nRBC: 0 % (ref 0.0–0.2)

## 2023-04-10 MED ORDER — DIPHENHYDRAMINE HCL 25 MG PO CAPS
50.0000 mg | ORAL_CAPSULE | Freq: Every evening | ORAL | Status: DC | PRN
  Administered 2023-04-10 – 2023-04-11 (×2): 50 mg via ORAL
  Filled 2023-04-10 (×2): qty 2

## 2023-04-10 MED ORDER — ALBUTEROL SULFATE (2.5 MG/3ML) 0.083% IN NEBU: 2.5000 mg | INHALATION_SOLUTION | RESPIRATORY_TRACT | Status: DC | PRN

## 2023-04-10 MED ORDER — ALBUTEROL SULFATE HFA 108 (90 BASE) MCG/ACT IN AERS: 2.0000 | INHALATION_SPRAY | RESPIRATORY_TRACT | Status: DC | PRN

## 2023-04-10 MED ORDER — MOMETASONE FURO-FORMOTEROL FUM 200-5 MCG/ACT IN AERO
2.0000 | INHALATION_SPRAY | Freq: Two times a day (BID) | RESPIRATORY_TRACT | Status: DC
  Administered 2023-04-11 – 2023-04-19 (×17): 2 via RESPIRATORY_TRACT
  Filled 2023-04-10: qty 8.8

## 2023-04-10 MED ORDER — ACETAMINOPHEN 650 MG RE SUPP: 650.0000 mg | Freq: Four times a day (QID) | RECTAL | Status: DC | PRN

## 2023-04-10 MED ORDER — PNEUMOCOCCAL 20-VAL CONJ VACC 0.5 ML IM SUSY
0.5000 mL | PREFILLED_SYRINGE | INTRAMUSCULAR | Status: AC
  Administered 2023-04-11: 0.5 mL via INTRAMUSCULAR
  Filled 2023-04-10: qty 0.5

## 2023-04-10 MED ORDER — MORPHINE SULFATE (PF) 2 MG/ML IV SOLN: 2.0000 mg | INTRAVENOUS | Status: DC | PRN

## 2023-04-10 MED ORDER — ACETAMINOPHEN 325 MG PO TABS: 650.0000 mg | ORAL_TABLET | Freq: Four times a day (QID) | ORAL | Status: DC | PRN

## 2023-04-10 MED ORDER — OXYCODONE HCL 5 MG PO TABS
5.0000 mg | ORAL_TABLET | ORAL | Status: DC | PRN
  Administered 2023-04-13: 5 mg via ORAL
  Filled 2023-04-10: qty 1

## 2023-04-10 MED ORDER — ONDANSETRON HCL 4 MG/2ML IJ SOLN: 4.0000 mg | Freq: Four times a day (QID) | INTRAMUSCULAR | Status: DC | PRN

## 2023-04-10 MED ORDER — ONDANSETRON HCL 4 MG PO TABS: 4.0000 mg | ORAL_TABLET | Freq: Four times a day (QID) | ORAL | Status: DC | PRN

## 2023-04-10 MED ORDER — HEPARIN SODIUM (PORCINE) 5000 UNIT/ML IJ SOLN
5000.0000 [IU] | Freq: Three times a day (TID) | INTRAMUSCULAR | Status: DC
  Administered 2023-04-10 – 2023-04-18 (×23): 5000 [IU] via SUBCUTANEOUS
  Filled 2023-04-10 (×24): qty 1

## 2023-04-10 NOTE — ED Triage Notes (Signed)
Pt with SOB, hx of COPD, pt states his breathing worse since the 3rd of this month.  Mild CP mid to left.

## 2023-04-10 NOTE — Assessment & Plan Note (Signed)
-   Patient reports that he uses ZzzQuil at home - Continue Benadryl as needed - Continue to monitor

## 2023-04-10 NOTE — Assessment & Plan Note (Signed)
-  Continue home dulera -no acute exacerbation -PRN albuterol

## 2023-04-10 NOTE — Progress Notes (Signed)
Called regarding new hydropneumothorax. 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 Recommend admitting to [facility] service and transferring to [facility]. No need for abx from a pulmonary perspective. PCCM to see in am for consideration of drainage.   Durel Salts, MD Pulmonary and Critical Care Medicine [facility] 04/10/2023 8:33 PM Pager: see AMION  If no response to pager, please call critical care on call (see AMION) until 7pm After 7:00 pm call Elink

## 2023-04-10 NOTE — Assessment & Plan Note (Signed)
-   As seen by PET scan in outpatient setting - Pulmonary consulted advises no antibiotics, transfer to [facility], PCCM evaluation upon arrival - N.p.o. after midnight in case of procedure - Pain control with pain scale - Continue to monitor

## 2023-04-10 NOTE — ED Notes (Signed)
[facility] is here for transport.  

## 2023-04-10 NOTE — ED Provider Notes (Signed)
[place] EMERGENCY DEPARTMENT AT [facility] Provider Note   CSN: [id number] Arrival date & time: 04/10/23  1655     History {Add pertinent medical, surgical, social history, OB history to HPI:1} Chief Complaint  Patient presents with   Shortness of Breath    Theodore [name] is a 67 y.o. male.  HPI 67 year old male presents after Dr. Ellin Saba called him to go to the ER due to an abnormal PET scan result.  Patient has been feeling short of breath on exertion for about 25 days.  He has a minimal cough.  No fevers.  He had some pleuritic chest pain but has not had it in a few weeks.  He had a PET scan on 4/25 and then this showed a hydropneumothorax and his oncologist (who he has never seen yet) called him and told him to go to the ER.  The patient states the right now he feels comfortable at rest.  Home Medications Prior to Admission medications   Medication Sig Start Date End Date Taking? Authorizing Provider  albuterol (PROVENTIL HFA;VENTOLIN HFA) 108 (90 Base) MCG/ACT inhaler Inhale 2 puffs into the lungs every 4 (four) hours as needed for wheezing or shortness of breath.   Yes [provider]  HYDROcodone-acetaminophen (NORCO) 7.5-325 MG tablet Take 1 tablet by mouth 4 (four) times daily as needed. 07/30/18  Yes [provider]  ibuprofen (ADVIL) 200 MG tablet Take 400 mg by mouth every 6 (six) hours as needed for moderate pain.   Yes [provider]  predniSONE (DELTASONE) 10 MG tablet Take 10 mg by mouth daily with breakfast.   Yes [provider]  SYMBICORT 160-4.5 MCG/ACT inhaler Inhale 1 puff into the lungs 2 (two) times daily as needed. 07/17/18  Yes [provider]      Allergies    Maalox [calcium carbonate antacid]    Review of Systems   Review of Systems  Constitutional:  Negative for fever.  Respiratory:  Positive for cough and shortness of breath.   Cardiovascular:  Negative for chest pain (none recently).     Physical Exam Updated Vital Signs BP (!) 157/94 (BP Location: Left Arm)   Pulse 85   Temp 98.6 F (37 C) (Oral)   Resp (!) 27   Ht 5\' 9"  (1.753 m)   Wt 61.2 kg   SpO2 97%   BMI 19.94 kg/m  Physical Exam Vitals and nursing note reviewed.  Constitutional:      General: He is not in acute distress.    Appearance: He is well-developed. He is not ill-appearing or diaphoretic.  HENT:     Head: Normocephalic and atraumatic.  Cardiovascular:     Rate and Rhythm: Normal rate and regular rhythm.     Heart sounds: Normal heart sounds.  Pulmonary:     Effort: Pulmonary effort is normal. No tachypnea, accessory muscle usage or respiratory distress.     Breath sounds: Examination of the right-lower field reveals decreased breath sounds. Decreased breath sounds present.  Abdominal:     Palpations: Abdomen is soft.     Tenderness: There is no abdominal tenderness.  Skin:    General: Skin is warm and dry.  Neurological:     Mental Status: He is alert.     ED Results / Procedures / Treatments   Labs (all labs ordered are listed, but only abnormal results are displayed) Labs Reviewed  CBC WITH DIFFERENTIAL/PLATELET - Abnormal; Notable for the following components:  Result Value   WBC 14.1 (*)    Neutro Abs 11.9 (*)    All other components within normal limits  BASIC METABOLIC PANEL - Abnormal; Notable for the following components:   Glucose, Bld 139 (*)    All other components within normal limits  TROPONIN I (HIGH SENSITIVITY)    EKG EKG Interpretation  Date/Time:  Monday April 10 2023 19:49:50 EDT Ventricular Rate:  81 PR Interval:  153 QRS Duration: 106 QT Interval:  402 QTC Calculation: 467 R Axis:   76 Text Interpretation: Sinus rhythm Baseline wander in lead(s) V5 tachycardia resolved when compared to earlier in the day nonspecific T waves also improved Confirmed by Pricilla Loveless [phone]) on 04/10/2023 7:56:45 PM  Radiology DG Chest 1 View  Result Date:  04/10/2023 CLINICAL DATA:  Shortness of breath for 1 month.  Mild cough EXAM: CHEST  1 VIEW COMPARISON:  PET-CT 04/06/2023.  Older CT scan and x-rays as well FINDINGS: Right-sided hydropneumothorax. Moderate pneumothorax component similar to the prior CT scan. Adjacent right lung base opacity. Left lung is hyperinflated. Normal cardiopericardial silhouette. No edema. IMPRESSION: Known right-sided hydropneumothorax. Hyperinflation. Please correlate with prior PET-CT scan of 04/06/2019 Electronically Signed   By: Karen Kays M.D.   On: 04/10/2023 17:28    Procedures Procedures  {Document cardiac monitor, telemetry assessment procedure when appropriate:1}  Medications Ordered in ED Medications - No data to display  ED Course/ Medical Decision Making/ A&P   {   Click here for ABCD2, HEART and other calculatorsREFRESH Note before signing :1}                          Medical Decision Making Amount and/or Complexity of Data Reviewed Labs: ordered.   ***  Dr. Celine Mans.   {Document critical care time when appropriate:1} {Document review of labs and clinical decision tools ie heart score, Chads2Vasc2 etc:1}  {Document your independent review of radiology images, and any outside records:1} {Document your discussion with family members, caretakers, and with consultants:1} {Document social determinants of health affecting pt's care:1} {Document your decision making why or why not admission, treatments were needed:1} Final Clinical Impression(s) / ED Diagnoses Final diagnoses:  None    Rx / DC Orders ED Discharge Orders     None

## 2023-04-10 NOTE — Assessment & Plan Note (Addendum)
-   Currently undergoing workup in the outpatient setting with Dr. Kirtland Bouchard - Likely the etiology behind the hydropneumothorax

## 2023-04-10 NOTE — H&P (Signed)
History and Physical    Patient: Theodore [name] WUJ:[id number] DOB: [birth year] DOA: 04/10/2023 DOS: the patient was seen and examined on 04/10/2023 PCP: Gareth Morgan, MD  Patient coming from: Home  Chief Complaint:  Chief Complaint  Patient presents with   Shortness of Breath   HPI: Theodore [name] is a 67 y.o. male with medical history significant of COPD, lung cancer currently undergoing staging testing, and insomnia presents the ED with a chief complaint of dyspnea.  Patient reports the dyspnea has been ongoing since April 2.  It is worse with exertion.  He he has seen providers and been under ongoing workup with the care of Dr. Kirtland Bouchard.  He reports that he has not been on any courses of antibiotics or steroids.  His dyspnea waxes and wanes.  He did have some chest pain on April 3 but he has not had any since then.  He reports it was pleuritic type pain.  He took 2 Advil and it went away.  He has not had any fevers.  He has a cough that is productive of clear sputum.  He has no other complaints.  He does report that he has been having more trouble sleeping.  He has been taking ZzzQuil at home.  He is also had a 23 pound weight loss over 2 years but he reports that he eats healthy and has not thought much of that.  Patient does not smoke, does not drink, does not use illicit drugs.  He is vaccinated for COVID and flu.  Patient reports that all treatment plans are okay up until resuscitation.  He wants to be DNR. Review of Systems: As mentioned in the history of present illness. All other systems reviewed and are negative. Past Medical History:  Diagnosis Date   COPD (chronic obstructive pulmonary disease) (HCC)    Past Surgical History:  Procedure Laterality Date   excision of cyst left ankle     MASS EXCISION Left 08/27/2018   Procedure: EXCISION LIPOMA LEFT BUTTOCK;  Surgeon: Franky Macho, MD;  Location: [facility];  Service: General;  Laterality: Left;   Social History:  reports that he quit  smoking about 16 years ago. His smoking use included cigarettes. He has never used smokeless tobacco. He reports that he does not currently use alcohol. He reports that he does not currently use drugs.  Allergies  Allergen Reactions   Maalox [Calcium Carbonate Antacid]     Short of breath    History reviewed. No pertinent family history.  Prior to Admission medications   Medication Sig Start Date End Date Taking? Authorizing Provider  albuterol (PROVENTIL HFA;VENTOLIN HFA) 108 (90 Base) MCG/ACT inhaler Inhale 2 puffs into the lungs every 4 (four) hours as needed for wheezing or shortness of breath.   Yes [provider]  HYDROcodone-acetaminophen (NORCO) 7.5-325 MG tablet Take 1 tablet by mouth 4 (four) times daily as needed. 07/30/18  Yes [provider]  ibuprofen (ADVIL) 200 MG tablet Take 400 mg by mouth every 6 (six) hours as needed for moderate pain.   Yes [provider]  predniSONE (DELTASONE) 10 MG tablet Take 10 mg by mouth daily with breakfast.   Yes [provider]  SYMBICORT 160-4.5 MCG/ACT inhaler Inhale 1 puff into the lungs 2 (two) times daily as needed. 07/17/18  Yes [provider]    Physical Exam: Vitals:   04/10/23 1701 04/10/23 1701 04/10/23 1959 04/10/23 2143  BP:  (!) 162/99 (!) 157/94   Pulse:  Marland Kitchen)  117 85   Resp:  18 (!) 27   Temp:  98.6 F (37 C)  97.9 F (36.6 C)  TempSrc:  Oral  Oral  SpO2:  94% 97%   Weight: 61.2 kg     Height: 5\' 9"  (1.753 m)      1.  General: Patient lying supine in bed,  no acute distress   2. Psychiatric: Alert and oriented x 3, mood and behavior normal for situation, pleasant and cooperative with exam   3. Neurologic: Speech and language are normal, face is symmetric, moves all 4 extremities voluntarily, at baseline without acute deficits on limited exam   4. HEENMT:  Head is atraumatic, normocephalic, pupils reactive to light, neck is supple, trachea is midline, mucous membranes  are moist   5. Respiratory : Diminished in the lower lung fields bilaterally without wheezing, rhonchi, rales, no cyanosis, no increase in work of breathing or accessory muscle use   6. Cardiovascular : Heart rate normal, rhythm is regular, no murmurs, rubs or gallops, no peripheral edema, peripheral pulses palpated   7. Gastrointestinal:  Abdomen is soft, nondistended, nontender to palpation bowel sounds active, no masses or organomegaly palpated   8. Skin:  Skin is warm, dry and intact without rashes, acute lesions, or ulcers on limited exam   9.Musculoskeletal:  No acute deformities or trauma, no asymmetry in tone, no peripheral edema, peripheral pulses palpated, no tenderness to palpation in the extremities  Data Reviewed: In the ED Temp 98.6, heart rate 85-117, respiratory rate 18, blood pressure 157/94, satting at 97% Leukocytosis at 14.1 likely related to acute stress, procalcitonin pending Hemoglobin 15.4 Chemistry unremarkable Troponin is less than 2 Chest x-ray is right-sided hydropneumothorax with hyperinflation EKG shows a heart rate of 81, sinus rhythm, QTc 467 PCCM was consulted and reports admission to [facility] and they will see patient in the a.m., but not ICU status  Assessment and Plan: * Hydropneumothorax - As seen by PET scan in outpatient setting - Pulmonary consulted advises no antibiotics, transfer to [facility], PCCM evaluation upon arrival - N.p.o. after midnight in case of procedure - Pain control with pain scale - Continue to monitor  Insomnia - Patient reports that he uses ZzzQuil at home - Continue Benadryl as needed - Continue to monitor  Lung cancer [facility]) - Currently undergoing workup in the outpatient setting with Dr. Kirtland Bouchard - Likely the etiology behind the hydropneumothorax  COPD (chronic obstructive pulmonary disease) (HCC) -Continue home dulera -no acute exacerbation -PRN albuterol       Advance Care Planning:   Code Status:  DNR  Consults: PCCM  Family Communication: Daughter at bedside  Severity of Illness: The appropriate patient status for this patient is OBSERVATION. Observation status is judged to be reasonable and necessary in order to provide the required intensity of service to ensure the patient's safety. The patient's presenting symptoms, physical exam findings, and initial radiographic and laboratory data in the context of their medical condition is felt to place them at decreased risk for further clinical deterioration. Furthermore, it is anticipated that the patient will be medically stable for discharge from the hospital within 2 midnights of admission.   Author: Lilyan Gilford, DO 04/10/2023 10:22 PM  For on call review [email or web address].

## 2023-04-10 NOTE — ED Notes (Signed)
Report given to Al. Patient is ready for transport.

## 2023-04-10 NOTE — Progress Notes (Signed)
I was called by radiology that they found an new right pneumothorax on the PET scan done for staging of possible lung cancer.  I have called the patient who has noted worsening of his breathing in the last 2 weeks.  I have asked him to come to the ER for appropriate treatment.  I have also called the ER and talked to Dr. Criss Alvine to give a heads up that he will be arriving.

## 2023-04-11 ENCOUNTER — Inpatient Hospital Stay: Payer: Medicare Other | Admitting: Hematology

## 2023-04-11 ENCOUNTER — Observation Stay (HOSPITAL_COMMUNITY): Payer: Medicare Other

## 2023-04-11 ENCOUNTER — Encounter (HOSPITAL_COMMUNITY)
Admission: EM | Disposition: A | Discharge status: Home / Self Care | Payer: Self-pay | Source: Ambulatory Visit | Attending: Internal Medicine

## 2023-04-11 DIAGNOSIS — Z66 Do not resuscitate: Secondary | ICD-10-CM | POA: Diagnosis present

## 2023-04-11 DIAGNOSIS — T40695A Adverse effect of other narcotics, initial encounter: Secondary | ICD-10-CM | POA: Diagnosis not present

## 2023-04-11 DIAGNOSIS — J948 Other specified pleural conditions: Secondary | ICD-10-CM | POA: Diagnosis present

## 2023-04-11 DIAGNOSIS — J910 Malignant pleural effusion: Secondary | ICD-10-CM | POA: Diagnosis not present

## 2023-04-11 DIAGNOSIS — Z7952 Long term (current) use of systemic steroids: Secondary | ICD-10-CM | POA: Diagnosis not present

## 2023-04-11 DIAGNOSIS — M5431 Sciatica, right side: Secondary | ICD-10-CM | POA: Diagnosis present

## 2023-04-11 DIAGNOSIS — Z7951 Long term (current) use of inhaled steroids: Secondary | ICD-10-CM | POA: Diagnosis not present

## 2023-04-11 DIAGNOSIS — C7951 Secondary malignant neoplasm of bone: Secondary | ICD-10-CM | POA: Diagnosis present

## 2023-04-11 DIAGNOSIS — C3491 Malignant neoplasm of unspecified part of right bronchus or lung: Secondary | ICD-10-CM | POA: Diagnosis not present

## 2023-04-11 DIAGNOSIS — Z4682 Encounter for fitting and adjustment of non-vascular catheter: Secondary | ICD-10-CM | POA: Diagnosis not present

## 2023-04-11 DIAGNOSIS — Z87891 Personal history of nicotine dependence: Secondary | ICD-10-CM | POA: Diagnosis not present

## 2023-04-11 DIAGNOSIS — G4700 Insomnia, unspecified: Secondary | ICD-10-CM | POA: Diagnosis present

## 2023-04-11 DIAGNOSIS — Z23 Encounter for immunization: Secondary | ICD-10-CM | POA: Diagnosis present

## 2023-04-11 DIAGNOSIS — R918 Other nonspecific abnormal finding of lung field: Secondary | ICD-10-CM | POA: Diagnosis not present

## 2023-04-11 DIAGNOSIS — Z79899 Other long term (current) drug therapy: Secondary | ICD-10-CM | POA: Diagnosis not present

## 2023-04-11 DIAGNOSIS — T450X5A Adverse effect of antiallergic and antiemetic drugs, initial encounter: Secondary | ICD-10-CM | POA: Diagnosis not present

## 2023-04-11 DIAGNOSIS — J9383 Other pneumothorax: Secondary | ICD-10-CM | POA: Diagnosis not present

## 2023-04-11 DIAGNOSIS — Z1152 Encounter for screening for COVID-19: Secondary | ICD-10-CM | POA: Diagnosis not present

## 2023-04-11 DIAGNOSIS — C342 Malignant neoplasm of middle lobe, bronchus or lung: Secondary | ICD-10-CM | POA: Diagnosis present

## 2023-04-11 DIAGNOSIS — E43 Unspecified severe protein-calorie malnutrition: Secondary | ICD-10-CM | POA: Diagnosis present

## 2023-04-11 DIAGNOSIS — J90 Pleural effusion, not elsewhere classified: Secondary | ICD-10-CM | POA: Diagnosis not present

## 2023-04-11 DIAGNOSIS — C7931 Secondary malignant neoplasm of brain: Secondary | ICD-10-CM | POA: Diagnosis present

## 2023-04-11 DIAGNOSIS — J449 Chronic obstructive pulmonary disease, unspecified: Secondary | ICD-10-CM | POA: Diagnosis present

## 2023-04-11 DIAGNOSIS — J9811 Atelectasis: Secondary | ICD-10-CM | POA: Diagnosis present

## 2023-04-11 DIAGNOSIS — C772 Secondary and unspecified malignant neoplasm of intra-abdominal lymph nodes: Secondary | ICD-10-CM | POA: Diagnosis present

## 2023-04-11 DIAGNOSIS — Z681 Body mass index (BMI) 19 or less, adult: Secondary | ICD-10-CM | POA: Diagnosis not present

## 2023-04-11 DIAGNOSIS — D72828 Other elevated white blood cell count: Secondary | ICD-10-CM | POA: Diagnosis present

## 2023-04-11 DIAGNOSIS — R636 Underweight: Secondary | ICD-10-CM | POA: Diagnosis not present

## 2023-04-11 DIAGNOSIS — C771 Secondary and unspecified malignant neoplasm of intrathoracic lymph nodes: Secondary | ICD-10-CM | POA: Diagnosis present

## 2023-04-11 DIAGNOSIS — I952 Hypotension due to drugs: Secondary | ICD-10-CM | POA: Diagnosis not present

## 2023-04-11 HISTORY — PX: CHEST TUBE INSERTION: SHX231

## 2023-04-11 LAB — CBC WITH DIFFERENTIAL/PLATELET
Abs Immature Granulocytes: 0.05 10*3/uL (ref 0.00–0.07)
Basophils Absolute: 0 10*3/uL (ref 0.0–0.1)
Basophils Relative: 0 %
Eosinophils Absolute: 0.7 10*3/uL — ABNORMAL HIGH (ref 0.0–0.5)
Eosinophils Relative: 6 %
HCT: 42.2 % (ref 39.0–52.0)
Hemoglobin: 13.9 g/dL (ref 13.0–17.0)
Immature Granulocytes: 0 %
Lymphocytes Relative: 22 %
Lymphs Abs: 2.8 10*3/uL (ref 0.7–4.0)
MCH: 27.9 pg (ref 26.0–34.0)
MCHC: 32.9 g/dL (ref 30.0–36.0)
MCV: 84.7 fL (ref 80.0–100.0)
Monocytes Absolute: 1.6 10*3/uL — ABNORMAL HIGH (ref 0.1–1.0)
Monocytes Relative: 13 %
Neutro Abs: 7.4 10*3/uL (ref 1.7–7.7)
Neutrophils Relative %: 59 %
Platelets: 339 10*3/uL (ref 150–400)
RBC: 4.98 MIL/uL (ref 4.22–5.81)
RDW: 15.2 % (ref 11.5–15.5)
WBC: 12.6 10*3/uL — ABNORMAL HIGH (ref 4.0–10.5)
nRBC: 0 % (ref 0.0–0.2)

## 2023-04-11 LAB — BODY FLUID CELL COUNT WITH DIFFERENTIAL
Eos, Fluid: 58 %
Lymphs, Fluid: 11 %
Monocyte-Macrophage-Serous Fluid: 31 % — ABNORMAL LOW (ref 50–90)
Neutrophil Count, Fluid: 0 % (ref 0–25)
Total Nucleated Cell Count, Fluid: 7875 cu mm — ABNORMAL HIGH (ref 0–1000)

## 2023-04-11 LAB — COMPREHENSIVE METABOLIC PANEL
ALT: 23 U/L (ref 0–44)
AST: 17 U/L (ref 15–41)
Albumin: 3.2 g/dL — ABNORMAL LOW (ref 3.5–5.0)
Alkaline Phosphatase: 115 U/L (ref 38–126)
Anion gap: 9 (ref 5–15)
BUN: 15 mg/dL (ref 8–23)
CO2: 26 mmol/L (ref 22–32)
Calcium: 8.9 mg/dL (ref 8.9–10.3)
Chloride: 100 mmol/L (ref 98–111)
Creatinine, Ser: 0.79 mg/dL (ref 0.61–1.24)
GFR, Estimated: >60 mL/min (ref >60)
Glucose, Bld: 96 mg/dL (ref 70–99)
Potassium: 3.7 mmol/L (ref 3.5–5.1)
Sodium: 135 mmol/L (ref 135–145)
Total Bilirubin: 0.6 mg/dL (ref 0.3–1.2)
Total Protein: 6.3 g/dL — ABNORMAL LOW (ref 6.5–8.1)

## 2023-04-11 LAB — MAGNESIUM: Magnesium: 2 mg/dL (ref 1.7–2.4)

## 2023-04-11 LAB — GLUCOSE, PLEURAL OR PERITONEAL FLUID: Glucose, Fluid: 46 mg/dL

## 2023-04-11 LAB — PROTEIN, PLEURAL OR PERITONEAL FLUID: Total protein, fluid: 5.1 g/dL

## 2023-04-11 LAB — PROCALCITONIN: Procalcitonin: 0.12 ng/mL

## 2023-04-11 LAB — LACTATE DEHYDROGENASE, PLEURAL OR PERITONEAL FLUID: LD, Fluid: 535 U/L — ABNORMAL HIGH (ref 3–23)

## 2023-04-11 SURGERY — CHEST TUBE INSERTION
Anesthesia: LOCAL | Laterality: Right

## 2023-04-11 MED ORDER — ATROPINE SULFATE 1 MG/10ML IJ SOSY
1.0000 mg | PREFILLED_SYRINGE | Freq: Once | INTRAMUSCULAR | Status: AC
  Administered 2023-04-11: 1 mg via INTRAVENOUS

## 2023-04-11 MED ORDER — FENTANYL CITRATE (PF) 100 MCG/2ML IJ SOLN
INTRAMUSCULAR | Status: AC
  Filled 2023-04-11: qty 2

## 2023-04-11 MED ORDER — LACTATED RINGERS IV SOLN: INTRAVENOUS | Status: DC

## 2023-04-11 MED ORDER — FENTANYL CITRATE (PF) 100 MCG/2ML IJ SOLN
50.0000 ug | Freq: Once | INTRAMUSCULAR | Status: AC
  Administered 2023-04-11: 50 ug via INTRAVENOUS

## 2023-04-11 MED ORDER — FENTANYL CITRATE PF 50 MCG/ML IJ SOSY: 50.0000 ug | PREFILLED_SYRINGE | Freq: Once | INTRAMUSCULAR | Status: DC

## 2023-04-11 MED ORDER — ATROPINE SULFATE 1 MG/10ML IJ SOSY
PREFILLED_SYRINGE | INTRAMUSCULAR | Status: AC
  Filled 2023-04-11: qty 10

## 2023-04-11 NOTE — Progress Notes (Signed)
Pt put on telemetry box #13, verified by [facility].

## 2023-04-11 NOTE — Procedures (Signed)
Insertion of Chest Tube Procedure Note  LARELL BANEY  [id number]  [birth year]  Date:04/11/23  Time:1:59 PM    Provider Performing: Omar Person   Procedure: Pleural Catheter Insertion w/ Imaging Guidance (40981)  Indication(s) Hydropneumothorax  Consent Risks of the procedure as well as the alternatives and risks of each were explained to the patient and/or caregiver.  Consent for the procedure was obtained and is signed in the bedside chart  Anesthesia Topical only with 1% lidocaine    Time Out Verified patient identification, verified procedure, site/side was marked, verified correct patient position, special equipment/implants available, medications/allergies/relevant history reviewed, required imaging and test results available.   Sterile Technique Maximal sterile technique including full sterile barrier drape, hand hygiene, sterile gown, sterile gloves, mask, hair covering, sterile ultrasound probe cover (if used).   Procedure Description Ultrasound used to identify appropriate pleural anatomy for placement and overlying skin marked. Area of placement cleaned and draped in sterile fashion.  A 14 French pigtail pleural catheter was placed into the right pleural space using Seldinger technique. Appropriate return of fluid was obtained.  The tube was connected to atrium and placed on -20 cm H2O wall suction.     Complications/Tolerance He had vagal reaction to dilation of tract for pigtail, bradycardic to 40s, BP 60s/30s. Given atropine 1mg . Tachycardic and borderline hypotensive until we had dressing in place and helped him lie back down in bed.  Chest X-ray is ordered to verify placement.   EBL Minimal  Specimen(s) fluid sent for usual studies

## 2023-04-11 NOTE — Progress Notes (Addendum)
[facility] Progress Note  Patient: Theodore [name]    ZOX:[id number]  DOA: 04/10/2023    Date of Service: the patient was seen and examined on 04/11/2023  Brief hospital course: 67 year old male with past medical history of COPD, lung cancer currently undergoing staging and insomnia who presented to the emergency room on the evening of 4/29 at [facility] in [place] as advised by his oncologist.  Patient had a PET scan done on 4/25 as outpatient and results noted right-sided pneumothorax with persistent moderate right pleural effusion.  Failed was evaluated by critical care who advised transferring patient to [facility] for patient to be seen by pulmonary who are planning to place chest tube on patient  Assessment and Plan: Hydropneumothorax: Principal problem.  Pulmonary seeing and plan to place a chest tube  COPD: Stable.  Currently not short of breath, not hypoxic, continue as needed nebs  Lung cancer: Currently undergoing staging and testing.  If patient's fluid is drained, will send for cytology.  Outpatient, being followed by oncology.  Weight loss: Patient's BMI at 19.  Possible protein calorie malnutrition.  Nutrition to see.  Insomnia: As needed Benadryl.     Body mass index is 19.94 kg/m.        Consultants: Pulmonary  Procedures: Chest tube placement 4/30  Antimicrobials: None  Code Status: DNR   Subjective: Patient with no complaints, hungry (seen before chest tube)  Objective: Vital signs stable Vitals:   04/11/23 0510 04/11/23 0803  BP: 110/67 107/76  Pulse: 89 94  Resp:  15  Temp: 97.7 F (36.5 C) 98.2 F (36.8 C)  SpO2: 97% 99%   No intake or output data in the 24 hours ending 04/11/23 1105 Filed Weights   04/10/23 1701  Weight: 61.2 kg   Body mass index is 19.94 kg/m.  Exam:  General: Alert and oriented x 3, no acute distress HEENT: Normocephalic and atraumatic, mucous membranes are moist Cardiovascular: Giller  rate and rhythm, S1-S2 Respiratory: Clear to auscultation bilaterally with decreased breath sounds on right side Abdomen: Soft, nontender, nondistended, positive bowel sounds Musculoskeletal: No clubbing or cyanosis or edema Skin: No skin breaks, tears or lesions Psychiatry: Appropriate, no evidence of psychoses Neurology: No focal deficits  Data Reviewed: White blood cell count 12.6, albumin 3.2  Disposition:  Status is: Inpatient     Anticipated discharge date: 5/3  Family Communication: Updated daughters at bedside DVT Prophylaxis: heparin injection 5,000 Units Start: 04/10/23 2200 SCDs Start: 04/10/23 2127    Author: Hollice Espy ,MD 04/11/2023 11:05 AM  To reach On-call, see care teams to locate the attending and reach out via [email or web address]. Between 7PM-7AM, please contact night-coverage If you still have difficulty reaching the attending provider, please page the [facility] (Director on Call) for [facility] on amion for assistance.

## 2023-04-11 NOTE — Consult Note (Signed)
 NAME:  Theodore [name], MRN:  [id number], DOB:  05/10/1956, LOS: 0 ADMISSION DATE:  04/10/2023, CONSULTATION DATE:  04/11/23 REFERRING MD:  Krishnan, CHIEF COMPLAINT:  dyspnea   History of Present Illness:  66yM with history of COPD, suspected metastatic lung cancer ff/b Dr. Katragadda who presented to ED 4/29 for dyspnea which has been gradually worsening/waxing/waning since 4/2. Some pleuritic CP at beginning of April. Cough with clear sputum production. 50+py smoker. Quit 2012. 23 lb unintentional weight loss over 2 years.   In [facility] ED found to have right hydropneumothorax and sent to [facility] for pigtail catheter placement.   Pertinent  Medical History  COPD Suspected metastatic lung cancer History of smoking  Significant Hospital Events: Including procedures, antibiotic start and stop dates in addition to other pertinent events   04/10/23 [facility]  Interim History / Subjective:    Objective   Blood pressure 107/76, pulse 94, temperature 98.2 F (36.8 C), temperature source Oral, resp. rate 15, height 5' 9" (1.753 m), weight 61.2 kg, SpO2 99 %.       No intake or output data in the 24 hours ending 04/11/23 1249 Filed Weights   04/10/23 1701  Weight: 61.2 kg    Examination: General appearance: 66 y.o., male, NAD, very thin Eyes: PERRL, tracking appropriately HENT: NCAT; MMM Neck: Trachea midline; no lymphadenopathy, no JVD Lungs: diminished bl, with normal respiratory effort CV: RRR, no murmur  Abdomen: Soft, non-tender; non-distended, BS present  Extremities: No peripheral edema, warm Skin: Normal turgor and texture; no rash Neuro: grossly nonfocal  Labs/imaging reviewed  Resolved Hospital Problem list     Assessment & Plan:   # R hydropneumothorax # Suspected metastatic lung cancer # COPD not in exacerbation - pigtail catheter placement today. He is at risk for nonexpandable lung but we'll see what degree of lung apposition we can  achieve with chest tube. We'll try to send pleural fluid for cytology and blood draw for guardant molecular testing.  - brovana, pulmicort while here, can transition back to symbicort at discharge - prn bronchodilators  - he may require bronchoscopy eventually if cytology negative or insufficient material obtained from pleural fluid for molecular testing  Best Practice (right click and "Reselect all SmartList Selections" daily)   Per primary  Labs   CBC: Recent Labs  Lab 04/10/23 1742 04/11/23 0410  WBC 14.1* 12.6*  NEUTROABS 11.9* 7.4  HGB 15.4 13.9  HCT 47.2 42.2  MCV 86.0 84.7  PLT 358 339    Basic Metabolic Panel: Recent Labs  Lab 04/10/23 1742 04/11/23 0410  NA 135 135  K 3.7 3.7  CL 101 100  CO2 24 26  GLUCOSE 139* 96  BUN 16 15  CREATININE 0.79 0.79  CALCIUM 9.0 8.9  MG  --  2.0   GFR: Estimated Creatinine Clearance: 77.6 mL/min (by C-G formula based on SCr of 0.79 mg/dL). Recent Labs  Lab 04/10/23 1742 04/11/23 0410  PROCALCITON 0.12  --   WBC 14.1* 12.6*    Liver Function Tests: Recent Labs  Lab 04/11/23 0410  AST 17  ALT 23  ALKPHOS 115  BILITOT 0.6  PROT 6.3*  ALBUMIN 3.2*   No results for input(s): "LIPASE", "AMYLASE" in the last 168 hours. No results for input(s): "AMMONIA" in the last 168 hours.  ABG No results found for: "PHART", "PCO2ART", "PO2ART", "HCO3", "TCO2", "ACIDBASEDEF", "O2SAT"   Coagulation Profile: No results for input(s): "INR", "PROTIME" in the last 168 hours.    Cardiac Enzymes: No results for input(s): "CKTOTAL", "CKMB", "CKMBINDEX", "TROPONINI" in the last 168 hours.  HbA1C: No results found for: "HGBA1C"  CBG: No results for input(s): "GLUCAP" in the last 168 hours.  Review of Systems:   12 point review of systems is negative except as in HPI  Past Medical History:  He,  has a past medical history of COPD (chronic obstructive pulmonary disease) (HCC).   Surgical History:   Past Surgical History:   Procedure Laterality Date   excision of cyst left ankle     MASS EXCISION Left 08/27/2018   Procedure: EXCISION LIPOMA LEFT BUTTOCK;  Surgeon: Jenkins, Mark, MD;  Location: [facility];  Service: General;  Laterality: Left;     Social History:   reports that he quit smoking about 16 years ago. His smoking use included cigarettes. He has never used smokeless tobacco. He reports that he does not currently use alcohol. He reports that he does not currently use drugs.   Family History:  His family history is not on file.   Allergies Allergies  Allergen Reactions   Maalox [Calcium Carbonate Antacid]     Short of breath     Home Medications  Prior to Admission medications   Medication Sig Start Date End Date Taking? Authorizing Provider  albuterol (PROVENTIL HFA;VENTOLIN HFA) 108 (90 Base) MCG/ACT inhaler Inhale 2 puffs into the lungs every 4 (four) hours as needed for wheezing or shortness of breath.   Yes [provider]  HYDROcodone-acetaminophen (NORCO) 7.5-325 MG tablet Take 1 tablet by mouth 4 (four) times daily as needed. 07/30/18  Yes [provider]  ibuprofen (ADVIL) 200 MG tablet Take 400 mg by mouth every 6 (six) hours as needed for moderate pain.   Yes [provider]  predniSONE (DELTASONE) 10 MG tablet Take 10 mg by mouth daily with breakfast.   Yes [provider]  SYMBICORT 160-4.5 MCG/ACT inhaler Inhale 1 puff into the lungs 2 (two) times daily as needed. 07/17/18  Yes [provider]     Critical care time: na        

## 2023-04-11 NOTE — Progress Notes (Signed)
Pt transported to ENDO via wheelchair by transportation staff.

## 2023-04-11 NOTE — Progress Notes (Signed)
Pt returned to 6 north room 20 alert and oriented x4. Chest tube in place.

## 2023-04-11 NOTE — Progress Notes (Signed)
Brief PCCM Progress Note  # Right hydropneumothorax sp pigtail placement 4/30 - keep to water seal today. Will hold off on starting suction till tomorrow given pretty profound vagal reaction he had to placement of the tube today.   Theodore [name] Pulmonary/Critical Care

## 2023-04-11 NOTE — Consult Note (Signed)
NAME:  Theodore [name], MRN:  [id number], DOB:  09-18-1956, LOS: 0 ADMISSION DATE:  04/10/2023, CONSULTATION DATE: 04/11/2023 REFERRING MD: [facility], CHIEF COMPLAINT: Presumed lung cancer and right hydropneumothorax  History of Present Illness:  67 year old male who gets his pulmonary care at [facility] has known lung mass and right pleural effusion hydropneumothorax and went to be treated at [facility].  He presented to [facility] acutely with dyspnea on exertion was transferred to [facility] for further evaluation and possible chest tube placement of right hydropneumothorax and the possibility of performing fiberoptic bronchoscopy for tissue diagnosis of his lung mass.  Pertinent  Medical History   Past Medical History:  Diagnosis Date   COPD (chronic obstructive pulmonary disease) (HCC)      Significant Hospital Events: Including procedures, antibiotic start and stop dates in addition to other pertinent events     Interim History / Subjective:  No acute distress at rest  Objective   Blood pressure 107/76, pulse 94, temperature 98.2 F (36.8 C), temperature source Oral, resp. rate 15, height 5\' 9"  (1.753 m), weight 61.2 kg, SpO2 99 %.       No intake or output data in the 24 hours ending 04/11/23 1236 Filed Weights   04/10/23 1701  Weight: 61.2 kg    Examination: General: Thin male no acute distress HENT: No JVD or lymphadenopathy is appreciated Lungs: Decreased breath sounds right base Cardiovascular: Heart sounds are regular Abdomen: Soft positive bowel sounds Extremities: Without edema Neuro: Grossly intact GU: Voids  Resolved Hospital Problem list     Assessment & Plan:  Right hydropneumothorax in the setting of suspected lung cancer he has been evaluated [facility] but presented to [facility] with shortness of breath transferred to [facility] for possible fiberoptic bronchoscopy and  chest tube insertion Plan to place chest tube this afternoon 20 cm suction Pleural fluid for cytology Consideration for fiberoptic bronchoscopy in the near future  COPD Continue bronchodilators and steroids  Best Practice (right click and "Reselect all SmartList Selections" daily)   Diet/type: NPO DVT prophylaxis: not indicated GI prophylaxis: PPI Lines: N/A Foley:  N/A Code Status:  DNR Last date of multidisciplinary goals of care discussion [tbd]  Labs   CBC: Recent Labs  Lab 04/10/23 1742 04/11/23 0410  WBC 14.1* 12.6*  NEUTROABS 11.9* 7.4  HGB 15.4 13.9  HCT 47.2 42.2  MCV 86.0 84.7  PLT 358 339    Basic Metabolic Panel: Recent Labs  Lab 04/10/23 1742 04/11/23 0410  NA 135 135  K 3.7 3.7  CL 101 100  CO2 24 26  GLUCOSE 139* 96  BUN 16 15  CREATININE 0.79 0.79  CALCIUM 9.0 8.9  MG  --  2.0   GFR: Estimated Creatinine Clearance: 77.6 mL/min (by C-G formula based on SCr of 0.79 mg/dL). Recent Labs  Lab 04/10/23 1742 04/11/23 0410  PROCALCITON 0.12  --   WBC 14.1* 12.6*    Liver Function Tests: Recent Labs  Lab 04/11/23 0410  AST 17  ALT 23  ALKPHOS 115  BILITOT 0.6  PROT 6.3*  ALBUMIN 3.2*   No results for input(s): "LIPASE", "AMYLASE" in the last 168 hours. No results for input(s): "AMMONIA" in the last 168 hours.  ABG No results found for: "PHART", "PCO2ART", "PO2ART", "HCO3", "TCO2", "ACIDBASEDEF", "O2SAT"   Coagulation Profile: No results for input(s): "INR", "PROTIME" in the last 168 hours.  Cardiac Enzymes: No results for input(s): "CKTOTAL", "  CKMB", "CKMBINDEX", "TROPONINI" in the last 168 hours.  HbA1C: No results found for: "HGBA1C"  CBG: No results for input(s): "GLUCAP" in the last 168 hours.  Review of Systems:   10 point review of system taken, please see HPI for positives and negatives. Positive for dyspnea on exertion  Past Medical History:  He,  has a past medical history of COPD (chronic obstructive  pulmonary disease) (HCC).   Surgical History:   Past Surgical History:  Procedure Laterality Date   excision of cyst left ankle     MASS EXCISION Left 08/27/2018   Procedure: EXCISION LIPOMA LEFT BUTTOCK;  Surgeon: Franky Macho, MD;  Location: [facility];  Service: General;  Laterality: Left;     Social History:   reports that he quit smoking about 16 years ago. His smoking use included cigarettes. He has never used smokeless tobacco. He reports that he does not currently use alcohol. He reports that he does not currently use drugs.   Family History:  His family history is not on file.   Allergies Allergies  Allergen Reactions   Maalox [Calcium Carbonate Antacid]     Short of breath     Home Medications  Prior to Admission medications   Medication Sig Start Date End Date Taking? Authorizing Provider  albuterol (PROVENTIL HFA;VENTOLIN HFA) 108 (90 Base) MCG/ACT inhaler Inhale 2 puffs into the lungs every 4 (four) hours as needed for wheezing or shortness of breath.   Yes [provider]  HYDROcodone-acetaminophen (NORCO) 7.5-325 MG tablet Take 1 tablet by mouth 4 (four) times daily as needed. 07/30/18  Yes [provider]  ibuprofen (ADVIL) 200 MG tablet Take 400 mg by mouth every 6 (six) hours as needed for moderate pain.   Yes [provider]  predniSONE (DELTASONE) 10 MG tablet Take 10 mg by mouth daily with breakfast.   Yes [provider]  SYMBICORT 160-4.5 MCG/ACT inhaler Inhale 1 puff into the lungs 2 (two) times daily as needed. 07/17/18  Yes [provider]     Critical care time: Elizebeth Brooking Vista Sawatzky ACNP Acute Care Nurse Practitioner Adolph Pollack Pulmonary/Critical Care Please consult Amion 04/11/2023, 12:37 PM

## 2023-04-11 NOTE — Interval H&P Note (Signed)
History and Physical Interval Note:  04/11/2023 1:02 PM  Theodore [name]  has presented today for surgery, with the diagnosis of hydropneumothorax.  The various methods of treatment have been discussed with the patient and family. After consideration of risks, benefits and other options for treatment, the patient has consented to  Procedure(s) with comments: CHEST TUBE INSERTION (Right) - fentanyl IV only as a surgical intervention.  The patient's history has been reviewed, patient examined, no change in status, stable for surgery.  I have reviewed the patient's chart and labs.  Questions were answered to the patient's satisfaction.     Omar Person

## 2023-04-11 NOTE — Progress Notes (Signed)
Dinner tray ordered for pt

## 2023-04-11 NOTE — H&P (View-Only) (Signed)
NAME:  Theodore [name], MRN:  [id number], DOB:  June 06, 1956, LOS: 0 ADMISSION DATE:  04/10/2023, CONSULTATION DATE:  04/11/23 REFERRING MD:  Rito Ehrlich, CHIEF COMPLAINT:  dyspnea   History of Present Illness:  67yM with history of COPD, suspected metastatic lung cancer ff/b Dr. Ellin Saba who presented to ED 4/29 for dyspnea which has been gradually worsening/waxing/waning since 4/2. Some pleuritic CP at beginning of April. Cough with clear sputum production. 50+py smoker. Quit 2012. 23 lb unintentional weight loss over 2 years.   In [facility] ED found to have right hydropneumothorax and sent to [facility] for pigtail catheter placement.   Pertinent  Medical History  COPD Suspected metastatic lung cancer History of smoking  Significant Hospital Events: Including procedures, antibiotic start and stop dates in addition to other pertinent events   04/10/23 transferred [facility] for [facility] placement  Interim History / Subjective:    Objective   Blood pressure 107/76, pulse 94, temperature 98.2 F (36.8 C), temperature source Oral, resp. rate 15, height 5\' 9"  (1.753 m), weight 61.2 kg, SpO2 99 %.       No intake or output data in the 24 hours ending 04/11/23 1249 Filed Weights   04/10/23 1701  Weight: 61.2 kg    Examination: General appearance: 67 y.o., male, NAD, very thin male, NAD, very thin Eyes: PERRL, tracking appropriately HENT: NCAT; MMM Neck: Trachea midline; no lymphadenopathy, no JVD Lungs: diminished bl, with normal respiratory effort CV: RRR, no murmur  Abdomen: Soft, non-tender; non-distended, BS present  Extremities: No peripheral edema, warm Skin: Normal turgor and texture; no rash Neuro: grossly nonfocal  Labs/imaging reviewed  Resolved Hospital Problem list     Assessment & Plan:   # R hydropneumothorax # Suspected metastatic lung cancer # COPD not in exacerbation - pigtail catheter placement today. He is at risk for nonexpandable lung but we'll see what degree of lung apposition we can  achieve with chest tube. We'll try to send pleural fluid for cytology and blood draw for guardant molecular testing.  - brovana, pulmicort while here, can transition back to symbicort at discharge - prn bronchodilators  - he may require bronchoscopy eventually if cytology negative or insufficient material obtained from pleural fluid for molecular testing  Best Practice (right click and "Reselect all SmartList Selections" daily)   Per primary  Labs   CBC: Recent Labs  Lab 04/10/23 1742 04/11/23 0410  WBC 14.1* 12.6*  NEUTROABS 11.9* 7.4  HGB 15.4 13.9  HCT 47.2 42.2  MCV 86.0 84.7  PLT 358 339    Basic Metabolic Panel: Recent Labs  Lab 04/10/23 1742 04/11/23 0410  NA 135 135  K 3.7 3.7  CL 101 100  CO2 24 26  GLUCOSE 139* 96  BUN 16 15  CREATININE 0.79 0.79  CALCIUM 9.0 8.9  MG  --  2.0   GFR: Estimated Creatinine Clearance: 77.6 mL/min (by C-G formula based on SCr of 0.79 mg/dL). Recent Labs  Lab 04/10/23 1742 04/11/23 0410  PROCALCITON 0.12  --   WBC 14.1* 12.6*    Liver Function Tests: Recent Labs  Lab 04/11/23 0410  AST 17  ALT 23  ALKPHOS 115  BILITOT 0.6  PROT 6.3*  ALBUMIN 3.2*   No results for input(s): "LIPASE", "AMYLASE" in the last 168 hours. No results for input(s): "AMMONIA" in the last 168 hours.  ABG No results found for: "PHART", "PCO2ART", "PO2ART", "HCO3", "TCO2", "ACIDBASEDEF", "O2SAT"   Coagulation Profile: No results for input(s): "INR", "PROTIME" in the last 168 hours.  Cardiac Enzymes: No results for input(s): "CKTOTAL", "CKMB", "CKMBINDEX", "TROPONINI" in the last 168 hours.  HbA1C: No results found for: "HGBA1C"  CBG: No results for input(s): "GLUCAP" in the last 168 hours.  Review of Systems:   12 point review of systems is negative except as in HPI  Past Medical History:  He,  has a past medical history of COPD (chronic obstructive pulmonary disease) (HCC).   Surgical History:   Past Surgical History:   Procedure Laterality Date   excision of cyst left ankle     MASS EXCISION Left 08/27/2018   Procedure: EXCISION LIPOMA LEFT BUTTOCK;  Surgeon: Franky Macho, MD;  Location: [facility];  Service: General;  Laterality: Left;     Social History:   reports that he quit smoking about 16 years ago. His smoking use included cigarettes. He has never used smokeless tobacco. He reports that he does not currently use alcohol. He reports that he does not currently use drugs.   Family History:  His family history is not on file.   Allergies Allergies  Allergen Reactions   Maalox [Calcium Carbonate Antacid]     Short of breath     Home Medications  Prior to Admission medications   Medication Sig Start Date End Date Taking? Authorizing Provider  albuterol (PROVENTIL HFA;VENTOLIN HFA) 108 (90 Base) MCG/ACT inhaler Inhale 2 puffs into the lungs every 4 (four) hours as needed for wheezing or shortness of breath.   Yes [provider]  HYDROcodone-acetaminophen (NORCO) 7.5-325 MG tablet Take 1 tablet by mouth 4 (four) times daily as needed. 07/30/18  Yes [provider]  ibuprofen (ADVIL) 200 MG tablet Take 400 mg by mouth every 6 (six) hours as needed for moderate pain.   Yes [provider]  predniSONE (DELTASONE) 10 MG tablet Take 10 mg by mouth daily with breakfast.   Yes [provider]  SYMBICORT 160-4.5 MCG/ACT inhaler Inhale 1 puff into the lungs 2 (two) times daily as needed. 07/17/18  Yes [provider]     Critical care time: na

## 2023-04-12 ENCOUNTER — Other Ambulatory Visit: Payer: Self-pay | Admitting: Student

## 2023-04-12 ENCOUNTER — Inpatient Hospital Stay (HOSPITAL_COMMUNITY): Payer: Medicare Other

## 2023-04-12 DIAGNOSIS — J948 Other specified pleural conditions: Secondary | ICD-10-CM | POA: Diagnosis not present

## 2023-04-12 LAB — PROTEIN, TOTAL: Total Protein: 6.5 g/dL (ref 6.5–8.1)

## 2023-04-12 LAB — BODY FLUID CULTURE W GRAM STAIN

## 2023-04-12 LAB — LACTATE DEHYDROGENASE: LDH: 197 U/L — ABNORMAL HIGH (ref 98–192)

## 2023-04-12 MED ORDER — ENSURE ENLIVE PO LIQD
237.0000 mL | Freq: Two times a day (BID) | ORAL | Status: DC
  Administered 2023-04-12 – 2023-04-19 (×8): 237 mL via ORAL

## 2023-04-12 MED ORDER — ADULT MULTIVITAMIN W/MINERALS CH
1.0000 | ORAL_TABLET | Freq: Every day | ORAL | Status: DC
  Administered 2023-04-12 – 2023-04-19 (×7): 1 via ORAL
  Filled 2023-04-12 (×7): qty 1

## 2023-04-12 NOTE — Progress Notes (Signed)
Chest tube set to suction per order

## 2023-04-12 NOTE — Progress Notes (Signed)
[facility] Progress Note  Patient: Theodore [name]     ZOX:[id number]  DOA: 04/10/2023   PCP: Gareth Morgan, MD       Brief hospital course: This is a 67 year old male with COPD and new diagnosis of lung cancer who presented to the [facility] ED for worsening shortness of breath. On 4/25, he had a PET scan done which showed a right-sided hydropneumothorax.  He was transferred to [facility] and received a pigtail pleural catheter on 4/30 upon admission.  Unfortunately, he had a vasovagal response during the procedure and heart rate dropped to the 40s with blood pressures dropping to 60s over 30s.  He was given a milligram of atropine.  Chest tube was left on waterseal  Subjective:  Having some mild pain around the chest tube site.  He does admit to pain in his right groin occasionally when walking.  He also has been having episodes of sciatica as outpatient with pain radiating down the back of his right leg.  Assessment and Plan: Principal Problem:   Hydropneumothorax -Appreciate pulmonary critical care assistance - Chest x-ray obtained today does not show significant improvement in the pleural effusion - have answered numerous questions from the patient and his daughters  Active Problems:   COPD (chronic obstructive pulmonary disease) (HCC) -No wheezing noted-he takes Symbicort and as needed albuterol as outpatient  Suspected lung cancer with metastasis -PET scan shows of right middle lobe primary bronchogenic carcinoma with mediastinal, osseous and probable upper abdominal nodal metastasis, right-sided pneumothorax with persistent moderate right pleural effusion, metastasis within the right acetabulum, proximal right femur and possibly bilateral adrenal metastasis-also noted are a right renal mass and liver lesions which are not hypermetabolic- discussed findings with patient and 2 daughters in the room  -MRI brain on 4/18 revealed numerous brain mets with a  dominant lesions being in the left frontal lobe - Will need bronchoscopy and tissue diagnosis if pleural fluid is unrevealing PET scan shows cancer like lesion in the right acetabulum and right femur as well -He had an appointment with the oncologist yesterday which she missed due to being in the hospital  Underweight Body mass index is 19.94 kg/m. -Twice daily Ensure  Insomni - Benadryl  History of sciatica     Code Status: DNR Consultants: PCCM Level of Care: Level of care: Telemetry Medical Total time on patient care: 30 min DVT prophylaxis:  heparin injection 5,000 Units Start: 04/10/23 2200 SCDs Start: 04/10/23 2127     Objective:   Vitals:   04/12/23 0813 04/12/23 0853 04/12/23 1312 04/12/23 1557  BP:  104/71 123/72 113/77  Pulse:  91 80 93  Resp:  16 16   Temp:  98.3 F (36.8 C) 98.7 F (37.1 C) 98.4 F (36.9 C)  TempSrc:  Oral Oral Oral  SpO2: 97% 98% 99% 98%  Weight:      Height:       Filed Weights   04/10/23 1701  Weight: 61.2 kg   Exam: General exam: Appears comfortable  HEENT: oral mucosa moist Respiratory system: Clear to auscultation.- poor breath sounds in R lower lung field- CT in place  Cardiovascular system: S1 & S2 heard  Gastrointestinal system: Abdomen soft, non-tender, nondistended. Normal bowel sounds   Extremities: No cyanosis, clubbing or edema Psychiatry:  Mood & affect appropriate.      CBC: Recent Labs  Lab 04/10/23 1742 04/11/23 0410  WBC 14.1* 12.6*  NEUTROABS 11.9* 7.4  HGB 15.4 13.9  HCT  47.2 42.2  MCV 86.0 84.7  PLT 358 339   Basic Metabolic Panel: Recent Labs  Lab 04/10/23 1742 04/11/23 0410  NA 135 135  K 3.7 3.7  CL 101 100  CO2 24 26  GLUCOSE 139* 96  BUN 16 15  CREATININE 0.79 0.79  CALCIUM 9.0 8.9  MG  --  2.0   GFR: Estimated Creatinine Clearance: 77.6 mL/min (by C-G formula based on SCr of 0.79 mg/dL).  Scheduled Meds:  feeding supplement  237 mL Oral BID BM   fentaNYL (SUBLIMAZE)  injection  50 mcg Intravenous Once   heparin  5,000 Units Subcutaneous Q8H   mometasone-formoterol  2 puff Inhalation BID   multivitamin with minerals  1 tablet Oral Daily   Continuous Infusions:  lactated ringers 999 mL/hr at 04/11/23 1900   Imaging and lab data was personally reviewed DG CHEST PORT 1 VIEW  Result Date: 04/12/2023 CLINICAL DATA:  40981 Hydropneumothorax 19147 EXAM: PORTABLE CHEST 1 VIEW COMPARISON:  04/11/2023 FINDINGS: Right basilar chest tube remains in place. Similar appearance with potential tube kinking at the skin entry site. Persistent right hydropneumothorax. Air component estimated at 10-15%. Moderate-large layering fluid component. Persistent but slightly improving right basilar airspace opacity. Normal heart size. No abnormal shift of the heart or mediastinum. Left lung is clear. No left-sided pleural effusion or pneumothorax. IMPRESSION: 1. Persistent right hydropneumothorax with right basilar chest tube in place. Similar appearance with potential tube kinking at the skin entry site. 2. Persistent but slightly improving right basilar airspace opacity. Electronically Signed   By: Duanne Guess D.O.   On: 04/12/2023 08:18   DG CHEST PORT 1 VIEW  Result Date: 04/11/2023 CLINICAL DATA:  Left pneumothorax and pleural effusion, chest tube placement EXAM: PORTABLE CHEST 1 VIEW COMPARISON:  04/11/2023 at 8:03 a.m. FINDINGS: Interval placement of a pigtail pleural drainage catheter along the right lung base. This might possibly be partially kinked at the cutaneous margin where it seems to make an abrupt turn, although this may be projectional. Persistent 10% right pneumothorax. Persistent large right pleural fluid collection. Aside for mild basilar scarring the left lung remains clear. Atherosclerotic calcification of the aortic arch. Heart size within normal limits. IMPRESSION: 1. Interval placement of a pigtail pleural drainage catheter along the right lung base. Cannot  exclude kinking at the skin surface, visual inspection suggested. Persistent 10% right pneumothorax and persistent large right pleural fluid collection. Electronically Signed   By: Gaylyn Rong M.D.   On: 04/11/2023 14:38   DG CHEST PORT 1 VIEW  Result Date: 04/11/2023 CLINICAL DATA:  Hydropneumothorax EXAM: PORTABLE CHEST 1 VIEW COMPARISON:  04/10/2023, 04/06/2023 FINDINGS: Single frontal view of the chest demonstrates a persistent right-sided hydropneumothorax, with no significant change in the gas component since prior exam. Slight increase in fluid component and right basilar consolidation since prior exam. Left chest is clear. Cardiac silhouette is stable. No acute fracture. IMPRESSION: 1. Persistent right-sided hydropneumothorax. Stable gas component, with slight increase in fluid component and right basilar consolidation since prior exam. Please refer to recent PET-CT describing underlying right middle lobe bronchogenic carcinoma, as well as nodal and bony metastases. Electronically Signed   By: Sharlet Salina M.D.   On: 04/11/2023 08:30    LOS: 1 day   Author: Calvert Cantor  04/12/2023 5:43 PM  To contact [facility]>   Check the care team in [facility] and look for the attending/consulting [facility] provider listed  Log into [email or web address] and use [facility] universal password  Go to> "[facility]"  and find provider  If you still have difficulty reaching the provider, please page the [facility] (Director on Call) for the Hospitalists listed on amion

## 2023-04-12 NOTE — Progress Notes (Signed)
   04/12/23 1246  SDOH Interventions  Food Insecurity Interventions Intervention Not Indicated;Inpatient TOC;Other (Comment) (Denies need.)   Pt states he does "fairly well", and has no concerns about meeting his basic needs.

## 2023-04-12 NOTE — Progress Notes (Signed)
Initial Nutrition Assessment  DOCUMENTATION CODES:   Severe malnutrition in context of social or environmental circumstances  INTERVENTION:  Ensure Plus High Protein po BID, each supplement provides 350 kcal and 20 grams of protein. MVI  Encourage po intake  Education about malnutrition   NUTRITION DIAGNOSIS:   Moderate Malnutrition related to social / environmental circumstances as evidenced by severe fat depletion, moderate muscle depletion, severe muscle depletion, moderate fat depletion.   GOAL:   Patient will meet greater than or equal to 90% of their needs   MONITOR:   PO intake, Labs, I & O's, Supplement acceptance, Weight trends  REASON FOR ASSESSMENT:   Consult Assessment of nutrition requirement/status  ASSESSMENT:   67 y.o. male with PMHx including COPD, suspected metastatic lung cancer who presents with worsening dyspnea  Plans for biopsy- chest drain observed at bedside with bright red output   Labs: nutrition labs WDL Meds: lactated ringers Wt: 23# wt loss x 2 yrs? CBW 135#  PO: 90% x 1 meal  I/O's: -455 ml   Visited patient at bedside who reports that he has never been a big eater and has never weighed more than 155# his entire life.   Patient reports he eats when he is hungry and not busy. He states sometimes he will have bacon, egg and cheese sandwich for breakfast. He tends to skip lunch due to being busy during the day and then he eats dinner. Patient reports drinking coca cola and juice mainly. He reports he is not a big water drinker but does drink it when he wants it. Patient endorses 20# wt loss x 2 years and states he recently ate a bad pork chop and lost his appetite for an entire week. He thinks he has gained some of his weight back, however, he got down to 130#.   Patient reports he lives by himself and normally cooks at home but he will pick something occasionally. Patient reports he quit smoking in 2012 and quit drinking in 1988.   He is  agreeable to ONS. He endorses potential allergy to calcium carbonate but states he had a reaction to maalox and has tolerated products such as Boost before.   NUTRITION - FOCUSED PHYSICAL EXAM:  Flowsheet Row Most Recent Value  Orbital Region Moderate depletion  Upper Arm Region Moderate depletion  Thoracic and Lumbar Region Severe depletion  Buccal Region Moderate depletion  Temple Region Severe depletion  Clavicle Bone Region Severe depletion  Clavicle and Acromion Bone Region Mild depletion  Scapular Bone Region Unable to assess  Dorsal Hand Moderate depletion  Patellar Region Moderate depletion  Anterior Thigh Region Severe depletion  Posterior Calf Region Mild depletion  Edema (RD Assessment) None  Hair Reviewed  Eyes Reviewed  Mouth Reviewed  Skin Reviewed  Nails Reviewed       Diet Order:   Diet Order             Diet regular Room service appropriate? Yes; Fluid consistency: Thin  Diet effective now                   EDUCATION NEEDS:   Education needs have been addressed  Skin:  Skin Assessment: Reviewed RN Assessment  Last BM:  4/29  Height:   Ht Readings from Last 1 Encounters:  04/10/23 5\' 9"  (1.753 m)    Weight:   Wt Readings from Last 1 Encounters:  04/10/23 61.2 kg    BMI:  Body mass index is 19.94 kg/m.  Estimated Nutritional Needs:   Kcal:  1800-2100 kcal  Protein:  75-95 g  Fluid:  >1.8 L    Leodis Rains, RDN, LDN  Clinical Nutrition

## 2023-04-12 NOTE — Progress Notes (Signed)
NAME:  Theodore [name], MRN:  [id number], DOB:  March 09, 1956, LOS: 1 ADMISSION DATE:  04/10/2023, CONSULTATION DATE: 04/11/2023 REFERRING MD: [facility], CHIEF COMPLAINT: Presumed lung cancer and right hydropneumothorax  History of Present Illness:  67 year old male who gets his pulmonary care at [facility] has known lung mass and right pleural effusion hydropneumothorax and went to be treated at [facility].  He presented to [facility] acutely with dyspnea on exertion was transferred to [facility] for further evaluation and possible chest tube placement of right hydropneumothorax and the possibility of performing fiberoptic bronchoscopy for tissue diagnosis of his lung mass.  Pertinent  Medical History   Past Medical History:  Diagnosis Date   COPD (chronic obstructive pulmonary disease) (HCC)      Significant Hospital Events: Including procedures, antibiotic start and stop dates in addition to other pertinent events   04/11/2023 right chest tube insertion    Interim History / Subjective:  No acute distress  Objective   Blood pressure 104/71, pulse 91, temperature 98.3 F (36.8 C), temperature source Oral, resp. rate 16, height 5\' 9"  (1.753 m), weight 61.2 kg, SpO2 98 %.        Intake/Output Summary (Last 24 hours) at 04/12/2023 0854 Last data filed at 04/12/2023 0852 Gross per 24 hour  Intake 1600 ml  Output 2055 ml  Net -455 ml   Filed Weights   04/10/23 1701  Weight: 61.2 kg    Examination: Thin male no acute distress No JVD is appreciated Decreased breath sounds in the right, right chest tube without airleak ,drainage from chest tube since insertion until 04/12/2023 at 0900 hrs. approximately 700 cc Heart sounds are regular Abdomen  soft nontender Extremities warm without edema Neurologically intact Resolved Hospital Problem list     Assessment & Plan:  Right hydropneumothorax in the setting of suspected lung cancer he has  been evaluated [facility] but presented to [facility] with shortness of breath transferred to [facility] for possible fiberoptic bronchoscopy and chest tube insertion  Right chest tube placed 04/11/2023 with 700 cc serous drainage out 04/12/2023 Continue chest tube to waterseal Monitor for fluid for cytology Hydro pneumo remains despite chest tube Bronchoscopy in the future     COPD Continue current treatment  Best Practice (right click and "Reselect all SmartList Selections" daily)   Diet/type: NPO DVT prophylaxis: not indicated GI prophylaxis: PPI Lines: N/A Foley:  N/A Code Status:  DNR Last date of multidisciplinary goals of care discussion [tbd]  Labs   CBC: Recent Labs  Lab 04/10/23 1742 04/11/23 0410  WBC 14.1* 12.6*  NEUTROABS 11.9* 7.4  HGB 15.4 13.9  HCT 47.2 42.2  MCV 86.0 84.7  PLT 358 339    Basic Metabolic Panel: Recent Labs  Lab 04/10/23 1742 04/11/23 0410  NA 135 135  K 3.7 3.7  CL 101 100  CO2 24 26  GLUCOSE 139* 96  BUN 16 15  CREATININE 0.79 0.79  CALCIUM 9.0 8.9  MG  --  2.0   GFR: Estimated Creatinine Clearance: 77.6 mL/min (by C-G formula based on SCr of 0.79 mg/dL). Recent Labs  Lab 04/10/23 1742 04/11/23 0410  PROCALCITON 0.12  --   WBC 14.1* 12.6*    Liver Function Tests: Recent Labs  Lab 04/11/23 0410 04/12/23 0438  AST 17  --   ALT 23  --   ALKPHOS 115  --   BILITOT 0.6  --   PROT 6.3*  6.5  ALBUMIN 3.2*  --    No results for input(s): "LIPASE", "AMYLASE" in the last 168 hours. No results for input(s): "AMMONIA" in the last 168 hours.  ABG No results found for: "PHART", "PCO2ART", "PO2ART", "HCO3", "TCO2", "ACIDBASEDEF", "O2SAT"   Coagulation Profile: No results for input(s): "INR", "PROTIME" in the last 168 hours.  Cardiac Enzymes: No results for input(s): "CKTOTAL", "CKMB", "CKMBINDEX", "TROPONINI" in the last 168 hours.  HbA1C: No results found for: "HGBA1C"  CBG: No  results for input(s): "GLUCAP" in the last 168 hours.    [name] Delaney Schnick ACNP Acute Care Nurse Practitioner [facility]/Critical Care Please consult Amion 04/12/2023, 8:54 AM

## 2023-04-12 NOTE — TOC Initial Note (Signed)
Transition of Care [facility]) - Initial/Assessment Note    Patient Details  Name: Theodore [name] MRN: [id number] Date of Birth: March 26, 1956  Transition of Care [facility]) CM/SW Contact:    Carley Hammed, LCSW Phone Number: 04/12/2023, 12:49 PM  Clinical Narrative:                 CSW met with pt at bedside and completed SDOH screen. Pt states he has no concerns about meeting basic needs and is able to afford housing and food. Pt denies any transportation issues to appointments. He states one of his daughters will pick him up at discharge. Pt states he lives alone, does not use DME or have HH. Pt states his PCP is about to retire, so he is getting into [facility]. Pt notes no concerns at this time. TOC will continue to monitor for identified needs, please consult if any arise.   Expected Discharge Plan: Home/Self Care Barriers to Discharge: Continued Medical Work up   Patient Goals and CMS Choice Patient states their goals for this hospitalization and ongoing recovery are:: Pt wants to get better and return home. CMS Medicare.gov Compare Post Acute Care list provided to:: Patient Choice offered to / list presented to : Patient      Expected Discharge Plan and Services       Living arrangements for the past 2 months: Single Family Home                                      Prior Living Arrangements/Services Living arrangements for the past 2 months: Single Family Home Lives with:: Self Patient language and need for interpreter reviewed:: Yes Do you feel safe going back to the place where you live?: Yes      Need for Family Participation in Patient Care: No (Comment) Care giver support system in place?: Yes (comment)   Criminal Activity/Legal Involvement Pertinent to Current Situation/Hospitalization: No - Comment as needed  Activities of Daily Living Home Assistive Devices/Equipment: Cane (specify quad or straight), Walker (specify type) ADL Screening (condition at time of  admission) Patient's cognitive ability adequate to safely complete daily activities?: Yes Is the patient deaf or have difficulty hearing?: No Does the patient have difficulty seeing, even when wearing glasses/contacts?: No Does the patient have difficulty concentrating, remembering, or making decisions?: No Patient able to express need for assistance with ADLs?: Yes Does the patient have difficulty dressing or bathing?: No Independently performs ADLs?: Yes (appropriate for developmental age) Does the patient have difficulty walking or climbing stairs?: No Weakness of Legs: None Weakness of Arms/Hands: None  Permission Sought/Granted Permission sought to share information with : Family Supports Permission granted to share information with : Yes, Verbal Permission Granted  Share Information with NAME: Letta Moynahan     Permission granted to share info w Relationship: Daughters     Emotional Assessment Appearance:: Appears stated age Attitude/Demeanor/Rapport: Engaged Affect (typically observed): Appropriate Orientation: : Oriented to Self, Oriented to Place, Oriented to  Time, Oriented to Situation Alcohol / Substance Use: Not Applicable Psych Involvement: No (comment)  Admission diagnosis:  Hydropneumothorax [J94.8] Patient Active Problem List   Diagnosis Date Noted   Hydropneumothorax 04/10/2023   COPD (chronic obstructive pulmonary disease) (HCC) 04/10/2023   Lung cancer (HCC) 04/10/2023   Insomnia 04/10/2023   Lipoma of buttock    PCP:  Gareth Morgan, MD Pharmacy:   [id number] - [place], [place] - [address] [place] [address] Phone: [phone] Fax: [phone]     Social Determinants of Health (SDOH) Social History: SDOH Screenings   Food Insecurity: Food Insecurity Present (04/10/2023)  Housing: Medium Risk (04/10/2023)  Transportation Needs: No Transportation Needs (04/10/2023)   Utilities: Not At Risk (04/10/2023)  Tobacco Use: Medium Risk (04/10/2023)   SDOH Interventions: Food Insecurity Interventions: Intervention Not Indicated, Inpatient TOC, Other (Comment) (Denies need.)   Readmission Risk Interventions     No data to display

## 2023-04-13 ENCOUNTER — Ambulatory Visit (HOSPITAL_COMMUNITY): Payer: Medicare Other

## 2023-04-13 ENCOUNTER — Ambulatory Visit: Admission: RE | Admit: 2023-04-13 | Payer: Medicare Other | Source: Ambulatory Visit

## 2023-04-13 ENCOUNTER — Inpatient Hospital Stay (HOSPITAL_COMMUNITY): Payer: Medicare Other

## 2023-04-13 ENCOUNTER — Encounter (HOSPITAL_COMMUNITY): Payer: Self-pay | Admitting: Student

## 2023-04-13 DIAGNOSIS — E43 Unspecified severe protein-calorie malnutrition: Secondary | ICD-10-CM | POA: Insufficient documentation

## 2023-04-13 DIAGNOSIS — J948 Other specified pleural conditions: Secondary | ICD-10-CM | POA: Diagnosis not present

## 2023-04-13 MED ORDER — MELATONIN 3 MG PO TABS
3.0000 mg | ORAL_TABLET | Freq: Every evening | ORAL | Status: DC | PRN
  Administered 2023-04-13 – 2023-04-18 (×6): 3 mg via ORAL
  Filled 2023-04-13 (×7): qty 1

## 2023-04-13 MED ORDER — ACETAMINOPHEN 650 MG RE SUPP: 650.0000 mg | Freq: Four times a day (QID) | RECTAL | Status: DC | PRN

## 2023-04-13 MED ORDER — ACETAMINOPHEN 325 MG PO TABS
650.0000 mg | ORAL_TABLET | Freq: Four times a day (QID) | ORAL | Status: DC | PRN
  Administered 2023-04-13 – 2023-04-18 (×6): 650 mg via ORAL
  Filled 2023-04-13 (×6): qty 2

## 2023-04-13 MED ORDER — SODIUM CHLORIDE 0.9 % IV BOLUS
250.0000 mL | Freq: Once | INTRAVENOUS | Status: AC
  Administered 2023-04-13: 250 mL via INTRAVENOUS

## 2023-04-13 NOTE — Progress Notes (Signed)
NAME:  Theodore [name], MRN:  [id number], DOB:  [birth year], LOS: 2 ADMISSION DATE:  04/10/2023, CONSULTATION DATE: 04/11/2023 REFERRING MD: [facility]ad, CHIEF COMPLAINT: Presumed lung cancer and right hydropneumothorax  History of Present Illness:  67 year old male who gets his pulmonary care at [facility] has known lung mass and right pleural effusion hydropneumothorax and went to be treated at [facility].  He presented to [facility] acutely with dyspnea on exertion was transferred to [facility] for further evaluation and possible chest tube placement of right hydropneumothorax and the possibility of performing fiberoptic bronchoscopy for tissue diagnosis of his lung mass.  Pertinent  Medical History   Past Medical History:  Diagnosis Date   COPD (chronic obstructive pulmonary disease) (HCC)      Significant Hospital Events: Including procedures, antibiotic start and stop dates in addition to other pertinent events   04/11/2023 right chest tube insertion    Interim History / Subjective:  No acute distress  Objective   Blood pressure 100/60, pulse 85, temperature 98.2 F (36.8 C), temperature source Oral, resp. rate 16, height 5\' 9"  (1.753 m), weight 61.2 kg, SpO2 99 %.        Intake/Output Summary (Last 24 hours) at 04/13/2023 1035 Last data filed at 04/13/2023 0530 Gross per 24 hour  Intake 600 ml  Output 1780 ml  Net -1180 ml   Filed Weights   04/10/23 1701  Weight: 61.2 kg    Examination: Awake alert no acute distress No JVD is appreciated Decreased breath sounds at right base Right chest tube with serous drainage no air leak noted currently on 20 cm of suction Heart sounds are regular Extremities are warm without edema   Assessment & Plan:  Right hydropneumothorax in the setting of suspected lung cancer he has been evaluated [facility] but presented to [facility] with shortness of breath  transferred to [facility] for possible fiberoptic bronchoscopy and chest tube insertion  Right chest tube placed 04/11/2023 with 1460 cc of drainage over 2 days Chest tube placed 20 years Cytology pending Questionable fiberoptic bronchoscopy in future    COPD Continue current treatment  Best Practice (right click and "Reselect all SmartList Selections" daily)   Diet/type: NPO DVT prophylaxis: not indicated GI prophylaxis: PPI Lines: N/A Foley:  N/A Code Status:  DNR Last date of multidisciplinary goals of care discussion [tbd]  Labs   CBC: Recent Labs  Lab 04/10/23 1742 04/11/23 0410  WBC 14.1* 12.6*  NEUTROABS 11.9* 7.4  HGB 15.4 13.9  HCT 47.2 42.2  MCV 86.0 84.7  PLT 358 339    Basic Metabolic Panel: Recent Labs  Lab 04/10/23 1742 04/11/23 0410  NA 135 135  K 3.7 3.7  CL 101 100  CO2 24 26  GLUCOSE 139* 96  BUN 16 15  CREATININE 0.79 0.79  CALCIUM 9.0 8.9  MG  --  2.0   GFR: Estimated Creatinine Clearance: 77.6 mL/min (by C-G formula based on SCr of 0.79 mg/dL). Recent Labs  Lab 04/10/23 1742 04/11/23 0410  PROCALCITON 0.12  --   WBC 14.1* 12.6*    Liver Function Tests: Recent Labs  Lab 04/11/23 0410 04/12/23 0438  AST 17  --   ALT 23  --   ALKPHOS 115  --   BILITOT 0.6  --   PROT 6.3* 6.5  ALBUMIN 3.2*  --    No results for input(s): "LIPASE", "AMYLASE" in the last 168 hours. No results  for input(s): "AMMONIA" in the last 168 hours.  ABG No results found for: "PHART", "PCO2ART", "PO2ART", "HCO3", "TCO2", "ACIDBASEDEF", "O2SAT"   Coagulation Profile: No results for input(s): "INR", "PROTIME" in the last 168 hours.  Cardiac Enzymes: No results for input(s): "CKTOTAL", "CKMB", "CKMBINDEX", "TROPONINI" in the last 168 hours.  HbA1C: No results found for: "HGBA1C"  CBG: No results for input(s): "GLUCAP" in the last 168 hours.    [name] Sajjad Honea ACNP Acute Care Nurse Practitioner [facility] Pulmonary/Critical Care Please  consult Amion 04/13/2023, 10:35 AM

## 2023-04-13 NOTE — Progress Notes (Addendum)
[facility] Progress Note  Patient: Theodore [name]     ZOX:[id number]  DOA: 04/10/2023   PCP: Gareth Morgan, MD       Brief hospital course: 67 year old male with COPD and new diagnosis of lung cancer who presented to the [facility] ED for worsening shortness of breath. On 4/25, he had a PET scan done which showed a right-sided hydropneumothorax.  He was transferred to [facility] and received a pigtail pleural catheter on 4/30 upon admission.  Unfortunately, he had a vasovagal response during the procedure and heart rate dropped to the 40s with blood pressures dropping to 60s/30s.  He was given a milligram of atropine.  Chest tube was left on waterseal, being managed by pulmonology.  Overnight 5/1 with episode of hypotension with BP 76/54, unclear etiology, improved after IVF bolus.  Subjective:  Overall feels much better.  No dyspnea or chest pain even at site of chest tube.  Early this morning at around 4 AM had BP of 76/54.  As per RN report, patient stated that anytime he gets IV fentanyl, his blood pressure drops.  Upon chart review, only got a dose of Benadryl 50 mg at around 9:40 PM and oxycodone at 2:17 AM.  Assessment and Plan: Principal Problem:   Hydropneumothorax 4/30: Right-sided ultrasound guided pleural catheter insertion by PCCM.  Ongoing high pleural fluid output, 905 mL over the last 24 hours and total of 1955 since placement.  Exudative pleural effusion (7.8K WBCs, predominantly eosinophils and monocytes.  LDH 535, glucose 46).  Cultures negative to date.  Cytology pending.  Chest x-ray 5/2: Stable right-sided chest tube with stable small right apical pneumothorax.  Pulmonology follow-up appreciated.  If cytology negative, considering fiberoptic bronchoscopy.  Improved.  Active Problems:   COPD (chronic obstructive pulmonary disease) (HCC) No clinical bronchospasm.  Continue Dulera (substituting for home Symbicort) and as needed bronchodilator  nebs.  Hypotension: Noted early morning hours on 5/1 with SBP in the 70s.  Improved after 250 mL IV fluid bolus.  Unclear etiology.  Discontinued Benadryl and all opioids.  Patient reports history of hypotension to IV fentanyl.  Continue Tylenol for pain and if not controlled then could consider IV Toradol.  Suspected lung cancer with metastasis -PET scan shows of right middle lobe primary bronchogenic carcinoma with mediastinal, osseous and probable upper abdominal nodal metastasis, right-sided pneumothorax with persistent moderate right pleural effusion, metastasis within the right acetabulum, proximal right femur and possibly bilateral adrenal metastasis-also noted are a right renal mass and liver lesions which are not hypermetabolic-prior TRH MD discussed findings with patient and 2 daughters. -MRI brain on 4/18 revealed numerous brain mets with a dominant lesions being in the left frontal lobe - Will need bronchoscopy and tissue diagnosis if pleural fluid is unrevealing - PET scan shows cancer like lesion in the right acetabulum and right femur as well - He had an appointment with the oncologist on 4/30 which he missed due to being in the hospital  Body mass index is 19.94 kg/m./Underweight -Twice daily Ensure  Insomnia Discontinue Benadryl which may also be a cause of his hypotension.  Started melatonin as needed.  History of sciatica   ACP documents: None present.   Code Status: DNR Consultants: PCCM DVT prophylaxis:  heparin injection 5,000 Units Start: 04/10/23 2200 SCDs Start: 04/10/23 2127 Family communication: None at bedside.    Objective:   Vitals:   04/13/23 0620 04/13/23 0622 04/13/23 0741 04/13/23 0823  BP: (!) 91/59 94/63  100/60  Pulse: 87 84 92 85  Resp: 20  19 16   Temp: 98.5 F (36.9 C)   98.2 F (36.8 C)  TempSrc: Oral   Oral  SpO2: 97%  95% 99%  Weight:      Height:       Filed Weights   04/10/23 1701  Weight: 61.2 kg   Exam: General exam:  Middle-age male, moderately built and thinly nourished sitting up comfortably in bed without distress. Respiratory system: Right-sided chest tube connected to underwater seal.  Slightly diminished breath sounds in the right base but otherwise clear to auscultation.  No increased work of breathing. Cardiovascular system: S1 & S2 heard, RRR. No JVD, murmurs, rubs, gallops or clicks. No pedal edema.  Telemetry personally reviewed: Sinus rhythm.  Discontinued telemetry 5/2. Gastrointestinal system: Abdomen is nondistended, soft and nontender. No organomegaly or masses felt. Normal bowel sounds heard. Central nervous system: Alert and oriented. No focal neurological deficits. Extremities: Symmetric 5 x 5 power. Skin: No rashes, lesions or ulcers Psychiatry: Judgement and insight appear normal. Mood & affect appropriate.     CBC: Recent Labs  Lab 04/10/23 1742 04/11/23 0410  WBC 14.1* 12.6*  NEUTROABS 11.9* 7.4  HGB 15.4 13.9  HCT 47.2 42.2  MCV 86.0 84.7  PLT 358 339   Basic Metabolic Panel: Recent Labs  Lab 04/10/23 1742 04/11/23 0410  NA 135 135  K 3.7 3.7  CL 101 100  CO2 24 26  GLUCOSE 139* 96  BUN 16 15  CREATININE 0.79 0.79  CALCIUM 9.0 8.9  MG  --  2.0   GFR: Estimated Creatinine Clearance: 77.6 mL/min (by C-G formula based on SCr of 0.79 mg/dL).  Scheduled Meds:  feeding supplement  237 mL Oral BID BM   fentaNYL (SUBLIMAZE) injection  50 mcg Intravenous Once   heparin  5,000 Units Subcutaneous Q8H   mometasone-formoterol  2 puff Inhalation BID   multivitamin with minerals  1 tablet Oral Daily   Continuous Infusions:  lactated ringers 999 mL/hr at 04/11/23 1900   Imaging and lab data was personally reviewed DG CHEST PORT 1 VIEW  Result Date: 04/13/2023 CLINICAL DATA:  Chest tube. EXAM: PORTABLE CHEST 1 VIEW COMPARISON:  Apr 12, 2023. FINDINGS: The heart size and mediastinal contours are within normal limits. Stable right basilar chest tube is noted. Small right  apical pneumothorax is noted and unchanged. No significant pulmonary consolidative process is noted. The visualized skeletal structures are unremarkable. IMPRESSION: Stable right-sided chest tube with stable small right apical pneumothorax. Electronically Signed   By: Lupita Raider M.D.   On: 04/13/2023 08:11   DG CHEST PORT 1 VIEW  Result Date: 04/12/2023 CLINICAL DATA:  40981 Hydropneumothorax 19147 EXAM: PORTABLE CHEST 1 VIEW COMPARISON:  04/11/2023 FINDINGS: Right basilar chest tube remains in place. Similar appearance with potential tube kinking at the skin entry site. Persistent right hydropneumothorax. Air component estimated at 10-15%. Moderate-large layering fluid component. Persistent but slightly improving right basilar airspace opacity. Normal heart size. No abnormal shift of the heart or mediastinum. Left lung is clear. No left-sided pleural effusion or pneumothorax. IMPRESSION: 1. Persistent right hydropneumothorax with right basilar chest tube in place. Similar appearance with potential tube kinking at the skin entry site. 2. Persistent but slightly improving right basilar airspace opacity. Electronically Signed   By: Duanne Guess D.O.   On: 04/12/2023 08:18   DG CHEST PORT 1 VIEW  Result Date: 04/11/2023 CLINICAL DATA:  Left pneumothorax and pleural effusion,  chest tube placement EXAM: PORTABLE CHEST 1 VIEW COMPARISON:  04/11/2023 at 8:03 a.m. FINDINGS: Interval placement of a pigtail pleural drainage catheter along the right lung base. This might possibly be partially kinked at the cutaneous margin where it seems to make an abrupt turn, although this may be projectional. Persistent 10% right pneumothorax. Persistent large right pleural fluid collection. Aside for mild basilar scarring the left lung remains clear. Atherosclerotic calcification of the aortic arch. Heart size within normal limits. IMPRESSION: 1. Interval placement of a pigtail pleural drainage catheter along the right lung  base. Cannot exclude kinking at the skin surface, visual inspection suggested. Persistent 10% right pneumothorax and persistent large right pleural fluid collection. Electronically Signed   By: Gaylyn Rong M.D.   On: 04/11/2023 14:38    LOS: 2 days   Marcellus Scott, MD,  FACP, [facility], [facility], [facility], [facility]   [facility] & Physician Advisor [place]     To contact the attending provider between 7A-7P or the covering provider during after hours 7P-7A, please log into the web site [email or web address] and access using universal [place] password for that web site. If you do not have the password, please call the hospital operator.

## 2023-04-13 NOTE — Progress Notes (Signed)
   04/13/23 0435  Assess: MEWS Score  Temp 98.4 F (36.9 C)  BP (!) 76/54  MAP (mmHg) (!) 62  Pulse Rate 83  Resp 20  SpO2 100 %  O2 Device Room Air  Assess: MEWS Score  MEWS Temp 0  MEWS Systolic 2  MEWS Pulse 0  MEWS RR 0  MEWS LOC 0  MEWS Score 2  MEWS Score Color Yellow  Assess: if the MEWS score is Yellow or Red  Were vital signs taken at a resting state? Yes  Focused Assessment Change from prior assessment (see assessment flowsheet)  Does the patient meet 2 or more of the SIRS criteria? No  Does the patient have a confirmed or suspected source of infection? No  MEWS guidelines implemented  Yes, yellow  Treat  MEWS Interventions Considered administering scheduled or prn medications/treatments as ordered  Take Vital Signs  Increase Vital Sign Frequency  Yellow: Q2hr x1, continue Q4hrs until patient remains green for 12hrs  Escalate  MEWS: Escalate Yellow: Discuss with charge nurse and consider notifying provider and/or RRT  Notify: Charge Nurse/RN  Name of Charge Nurse/RN Notified [facility]  Provider Notification  Provider Name/Title A Chavez,NP  Date Provider Notified 04/13/23  Time Provider Notified [phone]  Method of Notification Page (secure)  Notification Reason Other (Comment) (Patient has low BP.76/54,MAP 62,HR 83, R 20,98.4,100 % on room air showing a yellow MEWS ,medicated for pain at 0217.)  Provider response See new orders (to check BP on both arms ,new order for bolus)  Date of Provider Response 04/13/23  Time of Provider Response [id number]  Assess: SIRS CRITERIA  SIRS Temperature  0  SIRS Pulse 0  SIRS Respirations  0  SIRS WBC 0  SIRS Score Sum  0

## 2023-04-13 NOTE — Progress Notes (Signed)
New closed chest drainage system changed,no air leak.Chest tube to suction.  System obtained from [address].

## 2023-04-13 NOTE — Care Management Important Message (Signed)
Important Message  Patient Details  Name: JAZIR NEWEY MRN: [id number] Date of Birth: [birth year]   Medicare Important Message Given:  Yes     Sherilyn Banker 04/13/2023, 12:35 PM

## 2023-04-13 NOTE — Plan of Care (Signed)

## 2023-04-14 ENCOUNTER — Inpatient Hospital Stay (HOSPITAL_COMMUNITY): Payer: Medicare Other

## 2023-04-14 DIAGNOSIS — J948 Other specified pleural conditions: Secondary | ICD-10-CM | POA: Diagnosis not present

## 2023-04-14 DIAGNOSIS — J910 Malignant pleural effusion: Secondary | ICD-10-CM | POA: Diagnosis not present

## 2023-04-14 DIAGNOSIS — Z4682 Encounter for fitting and adjustment of non-vascular catheter: Secondary | ICD-10-CM | POA: Diagnosis not present

## 2023-04-14 LAB — BODY FLUID CULTURE W GRAM STAIN: Culture: NO GROWTH

## 2023-04-14 NOTE — Progress Notes (Signed)
NAME:  Theodore [name], MRN:  [id number], DOB:  1956/06/20, LOS: 3 ADMISSION DATE:  04/10/2023, CONSULTATION DATE: 04/11/2023 REFERRING MD: [facility], CHIEF COMPLAINT: Presumed lung cancer and right hydropneumothorax  History of Present Illness:  67 year old male who gets his pulmonary care at [facility] has known lung mass and right pleural effusion hydropneumothorax and went to be treated at [facility].  He presented to [facility] acutely with dyspnea on exertion was transferred to [facility] for further evaluation and possible chest tube placement of right hydropneumothorax and the possibility of performing fiberoptic bronchoscopy for tissue diagnosis of his lung mass.  Pertinent  Medical History   Past Medical History:  Diagnosis Date   COPD (chronic obstructive pulmonary disease) (HCC)      Significant Hospital Events: Including procedures, antibiotic start and stop dates in addition to other pertinent events   04/11/2023 right chest tube insertion    Interim History / Subjective:  Chest tube drainage is beginning to decrease  Objective   Blood pressure 129/75, pulse (!) 108, temperature 98.1 F (36.7 C), temperature source Oral, resp. rate 18, height 5\' 9"  (1.753 m), weight 61.2 kg, SpO2 99 %.        Intake/Output Summary (Last 24 hours) at 04/14/2023 0951 Last data filed at 04/14/2023 0865 Gross per 24 hour  Intake 240 ml  Output 1938 ml  Net -1698 ml   Filed Weights   04/10/23 1701  Weight: 61.2 kg    Examination: Awake and alert no acute distress much more animated today No JVD or lymphadenopathy Diminished breath sounds in the right side Right chest tube without airleak Heart sounds are regular Abdomen soft nontender Extremities are warm without edema   Assessment & Plan:  Right hydropneumothorax in the setting of suspected lung cancer he has been evaluated [facility] but presented to [facility] with shortness of breath transferred to [facility] for possible fiberoptic bronchoscopy and chest tube insertion  Right chest tube that was placed on 04/11/2023 and 778 cc of drainage overnight Continue suction chest tube Cytology is pending Would not remove chest tube until drainage is under 100 cc a day Questionable fiberoptic bronchoscopy in the future for tissue diagnosis if cytology is unknown remarkable      COPD Continue current treatment  Best Practice (right click and "Reselect all SmartList Selections" daily)   Diet/type: reg  DVT prophylaxis: not indicated GI prophylaxis: PPI Lines: N/A Foley:  N/A Code Status:  DNR Last date of multidisciplinary goals of care discussion [tbd]  Labs   CBC: Recent Labs  Lab 04/10/23 1742 04/11/23 0410  WBC 14.1* 12.6*  NEUTROABS 11.9* 7.4  HGB 15.4 13.9  HCT 47.2 42.2  MCV 86.0 84.7  PLT 358 339    Basic Metabolic Panel: Recent Labs  Lab 04/10/23 1742 04/11/23 0410  NA 135 135  K 3.7 3.7  CL 101 100  CO2 24 26  GLUCOSE 139* 96  BUN 16 15  CREATININE 0.79 0.79  CALCIUM 9.0 8.9  MG  --  2.0   GFR: Estimated Creatinine Clearance: 77.6 mL/min (by C-G formula based on SCr of 0.79 mg/dL). Recent Labs  Lab 04/10/23 1742 04/11/23 0410  PROCALCITON 0.12  --   WBC 14.1* 12.6*    Liver Function Tests: Recent Labs  Lab 04/11/23 0410 04/12/23 0438  AST 17  --   ALT 23  --   ALKPHOS 115  --   BILITOT  0.6  --   PROT 6.3* 6.5  ALBUMIN 3.2*  --    No results for input(s): "LIPASE", "AMYLASE" in the last 168 hours. No results for input(s): "AMMONIA" in the last 168 hours.  ABG No results found for: "PHART", "PCO2ART", "PO2ART", "HCO3", "TCO2", "ACIDBASEDEF", "O2SAT"   Coagulation Profile: No results for input(s): "INR", "PROTIME" in the last 168 hours.  Cardiac Enzymes: No results for input(s): "CKTOTAL", "CKMB", "CKMBINDEX", "TROPONINI" in the last 168 hours.  HbA1C: No results found  for: "HGBA1C"  CBG: No results for input(s): "GLUCAP" in the last 168 hours.    [name] Thoma Paulsen ACNP Acute Care Nurse Practitioner [facility] Pulmonary/Critical Care Please consult Amion 04/14/2023, 9:51 AM

## 2023-04-14 NOTE — Progress Notes (Signed)
[facility] Progress Note  Patient: Theodore [name]     WNU:[id number]  DOA: 04/10/2023   PCP: Gareth Morgan, MD       Brief hospital course: 67 year old male with COPD and new diagnosis of lung cancer who presented to the [facility] ED for worsening shortness of breath. On 4/25, he had a PET scan done which showed a right-sided hydropneumothorax.  He was transferred to [facility] and received a pigtail pleural catheter on 4/30 upon admission.  Unfortunately, he had a vasovagal response during the procedure and heart rate dropped to the 40s with blood pressures dropping to 60s/30s.  He was given a milligram of atropine.  Chest tube was left on waterseal, being managed by pulmonology.  Overnight 5/1 with episode of hypotension with BP 76/54, unclear etiology, improved after IVF bolus.  Subjective:  Overall feels much better.  No dyspnea or chest pain even at site of chest tube.  Early this morning at around 4 AM had BP of 76/54.  As per RN report, patient stated that anytime he gets IV fentanyl, his blood pressure drops.  Upon chart review, only got a dose of Benadryl 50 mg at around 9:40 PM and oxycodone at 2:17 AM.  Assessment and Plan: Principal Problem:   Hydropneumothorax 4/30: Right-sided ultrasound guided pleural catheter insertion by PCCM.  Ongoing high pleural fluid output, 905 mL over the last 24 hours and total of 1955 since placement.  Exudative pleural effusion (7.8K WBCs, predominantly eosinophils and monocytes.  LDH 535, glucose 46).  Cultures negative to date.  Cytology still pending.  Serial chest x-ray being followed.  Chest x-ray 5/3: COPD changes with small residual right apical pneumothorax.  53 mL output documented over the last 24 hours.  As per communication with pulmonology, continue chest tube pending cytology results and if needs bronchoscopy, better to keep the chest tube until then.  Also need to monitor that the pneumothorax does not get worse  on waterseal.  Active Problems:   COPD (chronic obstructive pulmonary disease) (HCC) No clinical bronchospasm.  Continue Dulera (substituting for home Symbicort) and as needed bronchodilator nebs.  Hypotension: Noted early morning hours on 5/1 with SBP in the 70s.  Improved after 250 mL IV fluid bolus.  Unclear etiology.  Discontinued Benadryl and all opioids.  Patient reports history of hypotension to any "strong medications".  Continue Tylenol for pain and if not controlled then could consider IV Toradol.  Suspected lung cancer with metastasis -PET scan shows of right middle lobe primary bronchogenic carcinoma with mediastinal, osseous and probable upper abdominal nodal metastasis, right-sided pneumothorax with persistent moderate right pleural effusion, metastasis within the right acetabulum, proximal right femur and possibly bilateral adrenal metastasis-also noted are a right renal mass and liver lesions which are not hypermetabolic-prior TRH MD discussed findings with patient and 2 daughters. -MRI brain on 4/18 revealed numerous brain mets with a dominant lesions being in the left frontal lobe - Will need bronchoscopy and tissue diagnosis if pleural fluid is unrevealing - PET scan shows cancer like lesion in the right acetabulum and right femur as well - He had an appointment with the oncologist on 4/30 which he missed due to being in the hospital  Body mass index is 19.94 kg/m./Underweight -Twice daily Ensure  Insomnia Discontinue Benadryl which may also be a cause of his hypotension.  Started melatonin as needed.  History of sciatica   ACP documents: None present.   Code Status: DNR Consultants: PCCM DVT  prophylaxis:  heparin injection 5,000 Units Start: 04/10/23 2200 SCDs Start: 04/10/23 2127 Family communication: None at bedside.    Objective:   Vitals:   04/14/23 0014 04/14/23 0411 04/14/23 0831 04/14/23 0850  BP: 101/74 94/64  129/75  Pulse: 88 89  (!) 108  Resp: 18  18  18   Temp: 97.7 F (36.5 C) 97.7 F (36.5 C)  98.1 F (36.7 C)  TempSrc: Oral Oral  Oral  SpO2: 100% 100% 100% 99%  Weight:      Height:       Filed Weights   04/10/23 1701  Weight: 61.2 kg   Exam: General exam: Middle-age male, moderately built and thinly nourished sitting up comfortably in bed without distress. Respiratory system: Right-sided chest tube connected to underwater seal.  Slightly diminished breath sounds in the right base but otherwise clear to auscultation.  No increased work of breathing.  No change/stable compared to yesterday. Cardiovascular system: S1 & S2 heard, RRR. No JVD, murmurs, rubs, gallops or clicks. No pedal edema.  Telemetry personally reviewed: Sinus rhythm.  Discontinued telemetry 5/2. Gastrointestinal system: Abdomen is nondistended, soft and nontender. No organomegaly or masses felt. Normal bowel sounds heard. Central nervous system: Alert and oriented. No focal neurological deficits. Extremities: Symmetric 5 x 5 power. Skin: No rashes, lesions or ulcers Psychiatry: Judgement and insight appear normal. Mood & affect appropriate.     CBC: Recent Labs  Lab 04/10/23 1742 04/11/23 0410  WBC 14.1* 12.6*  NEUTROABS 11.9* 7.4  HGB 15.4 13.9  HCT 47.2 42.2  MCV 86.0 84.7  PLT 358 339   Basic Metabolic Panel: Recent Labs  Lab 04/10/23 1742 04/11/23 0410  NA 135 135  K 3.7 3.7  CL 101 100  CO2 24 26  GLUCOSE 139* 96  BUN 16 15  CREATININE 0.79 0.79  CALCIUM 9.0 8.9  MG  --  2.0   GFR: Estimated Creatinine Clearance: 77.6 mL/min (by C-G formula based on SCr of 0.79 mg/dL).  Scheduled Meds:  feeding supplement  237 mL Oral BID BM   heparin  5,000 Units Subcutaneous Q8H   mometasone-formoterol  2 puff Inhalation BID   multivitamin with minerals  1 tablet Oral Daily   Continuous Infusions:   Imaging and lab data was personally reviewed DG CHEST PORT 1 VIEW  Result Date: 04/14/2023 CLINICAL DATA:  Chest tube EXAM: PORTABLE  CHEST 1 VIEW COMPARISON:  Portable exam 0611 hours compared to 04/13/2023 FINDINGS: Pigtail thoracostomy tube at inferior RIGHT hemithorax. Normal heart size, mediastinal contours, and pulmonary vascularity. Atherosclerotic calcification aorta. Emphysematous changes consistent with COPD. Small residual RIGHT apex pneumothorax. Remaining lungs clear. Skin folds project over LEFT upper lobe. Osseous structures unremarkable. Calcified mass RIGHT upper quadrant 3.1 cm diameter, known renal lesion by prior PET-CT. IMPRESSION: COPD changes with small residual RIGHT apex pneumothorax. Aortic Atherosclerosis (ICD10-I70.0) and Emphysema (ICD10-J43.9). Electronically Signed   By: Ulyses Southward M.D.   On: 04/14/2023 08:40   DG CHEST PORT 1 VIEW  Result Date: 04/13/2023 CLINICAL DATA:  Chest tube. EXAM: PORTABLE CHEST 1 VIEW COMPARISON:  Apr 12, 2023. FINDINGS: The heart size and mediastinal contours are within normal limits. Stable right basilar chest tube is noted. Small right apical pneumothorax is noted and unchanged. No significant pulmonary consolidative process is noted. The visualized skeletal structures are unremarkable. IMPRESSION: Stable right-sided chest tube with stable small right apical pneumothorax. Electronically Signed   By: Lupita Raider M.D.   On: 04/13/2023 08:11  LOS: 3 days   Marcellus Scott, MD,  FACP, [facility], [facility], [facility], [facility]   [facility] [place]     To contact the attending provider between 7A-7P or the covering provider during after hours 7P-7A, please log into the web site [email or web address] and access using universal  password for that web site. If you do not have the password, please call the hospital operator.

## 2023-04-14 NOTE — Progress Notes (Addendum)
No overnight distress. Chest tube output has decreased from prior notes. Output of 20 ml overnight. Shift started with 100 ml in chamber. Pt pain controlled with Tylenol. Melatonin give for sleep and was effective.Pt BP early morning is 94/64 HR 89.   Chest tube to suction. No air leaks noted.

## 2023-04-15 ENCOUNTER — Inpatient Hospital Stay (HOSPITAL_COMMUNITY): Payer: Medicare Other

## 2023-04-15 DIAGNOSIS — Z4682 Encounter for fitting and adjustment of non-vascular catheter: Secondary | ICD-10-CM

## 2023-04-15 DIAGNOSIS — J910 Malignant pleural effusion: Secondary | ICD-10-CM | POA: Diagnosis not present

## 2023-04-15 DIAGNOSIS — J948 Other specified pleural conditions: Secondary | ICD-10-CM | POA: Diagnosis not present

## 2023-04-15 NOTE — Progress Notes (Signed)
Pt Chest tube dressing changed using sterile procedure and 2 Rns, Dressing soiled. CT tubing checked for kinks and leaks. A small kink noted. Kink removed. Chest tube set to -20 draining yellow out put. PT denies shob, chest pain, or increased pain at the insertion site. PT is voiding. Had 3 Bm's today. Pt encouraged to increase PO water intake.

## 2023-04-15 NOTE — Progress Notes (Signed)
   NAME:  Theodore [name], MRN:  [id number], DOB:  11/26/1956, LOS: 4 ADMISSION DATE:  04/10/2023, CONSULTATION DATE: 04/11/2023 REFERRING MD: [facility], CHIEF COMPLAINT: Presumed lung cancer and right hydropneumothorax  History of Present Illness:  67 year old male who gets his pulmonary care at [facility] has known lung mass and right pleural effusion hydropneumothorax and went to be treated at [facility].  He presented to [facility] acutely with dyspnea on exertion was transferred to [facility] for further evaluation and possible chest tube placement of right hydropneumothorax and the possibility of performing fiberoptic bronchoscopy for tissue diagnosis of his lung mass.  Pertinent  Medical History   Past Medical History:  Diagnosis Date   COPD (chronic obstructive pulmonary disease) (HCC)      Significant Hospital Events: Including procedures, antibiotic start and stop dates in addition to other pertinent events   04/11/2023 right chest tube insertion    Interim History / Subjective:  Cytology still pending. No SOB. Has been up walking around.   Objective   Blood pressure 112/76, pulse 89, temperature 98.1 F (36.7 C), temperature source Oral, resp. rate 16, height 5\' 9"  (1.753 m), weight 61.2 kg, SpO2 96 %.        Intake/Output Summary (Last 24 hours) at 04/15/2023 1214 Last data filed at 04/15/2023 1478 Gross per 24 hour  Intake 720 ml  Output 705 ml  Net 15 ml    Filed Weights   04/10/23 1701  Weight: 61.2 kg    Examination: Chronically ill appearing man sitting up in bed eating lunch [place]/AT, eyes anicteric Breathing comfortably on RA, CTAB Chest tube with yellow fluid draining, 190cc in atrium currently. S1S2, RRR Abd thin, nondistended No LE edema Awake, alert, moving all extremities   Chest tube output: 180cc on night shift Pleural fluid cytology pending  CXR personally reviewed> small apical and lateral  pneumothorax persists  Assessment & Plan:  Right hydropneumothorax in the setting of suspected lung cancer he has been evaluated [facility] but presented to [facility] with shortness of breath transferred to [facility] for possible fiberoptic bronchoscopy and chest tube insertion -chest tube to -20cm H20 -monitor chest tube output.  -cytology still pending -may still need a bronch if cytology does not give an answer -chest tube can come off suction to facilitate mobility  COPD Continue Dulera  Best Practice (right click and "Reselect all SmartList Selections" daily)   Per primary  Labs   CBC: Recent Labs  Lab 04/10/23 1742 04/11/23 0410  WBC 14.1* 12.6*  NEUTROABS 11.9* 7.4  HGB 15.4 13.9  HCT 47.2 42.2  MCV 86.0 84.7  PLT 358 339     Basic Metabolic Panel: Recent Labs  Lab 04/10/23 1742 04/11/23 0410  NA 135 135  K 3.7 3.7  CL 101 100  CO2 24 26  GLUCOSE 139* 96  BUN 16 15  CREATININE 0.79 0.79  CALCIUM 9.0 8.9  MG  --  2.0    GFR: Estimated Creatinine Clearance: 77.6 mL/min (by C-G formula based on SCr of 0.79 mg/dL). Recent Labs  Lab 04/10/23 1742 04/11/23 0410  PROCALCITON 0.12  --   WBC 14.1* 12.6*    Steffanie Dunn, DO 04/15/23 1:11 PM [facility]  For contact information, see Amion. If no response to pager, please call PCCM consult pager. After hours, 7PM- 7AM, please call Elink.

## 2023-04-15 NOTE — Progress Notes (Signed)
[facility] Progress Note  Patient: Theodore [name]     UJW:[id number]  DOA: 04/10/2023   PCP: Gareth Morgan, MD       Brief hospital course: 67 year old male with COPD and new diagnosis of lung cancer who presented to the [facility] ED for worsening shortness of breath. On 4/25, he had a PET scan done which showed a right-sided hydropneumothorax.  He was transferred to [facility] and received a pigtail pleural catheter on 4/30 upon admission.  Unfortunately, he had a vasovagal response during the procedure and heart rate dropped to the 40s with blood pressures dropping to 60s/30s.  He was given a milligram of atropine.  Chest tube was left on waterseal, being managed by pulmonology.  Overnight 5/1 with episode of hypotension with BP 76/54, unclear etiology, improved after IVF bolus.  Subjective:  Overall feels much better.  No dyspnea or chest pain even at site of chest tube.  Early this morning at around 4 AM had BP of 76/54.  As per RN report, patient stated that anytime he gets IV fentanyl, his blood pressure drops.  Upon chart review, only got a dose of Benadryl 50 mg at around 9:40 PM and oxycodone at 2:17 AM.  Assessment and Plan: Principal Problem:   Hydropneumothorax 4/30: Right-sided ultrasound guided pleural catheter insertion by PCCM.  Ongoing high pleural fluid output, 905 mL over the last 24 hours and total of 1955 since placement.  Exudative pleural effusion (7.8K WBCs, predominantly eosinophils and monocytes.  LDH 535, glucose 46).  Cultures negative to date.  Cytology still pending.  Serial chest x-ray being followed.  Chest x-ray 5/3: COPD changes with small residual right apical pneumothorax.  53 mL output documented over the last 24 hours.  As per communication with pulmonology, continue chest tube pending cytology results and if needs bronchoscopy, better to keep the chest tube until then.  Also need to monitor that the pneumothorax does not get worse  on waterseal.  Pulmonology continues to follow and directing care.  Apart from this, there is not much else active going on at this time.  Active Problems:   COPD (chronic obstructive pulmonary disease) (HCC) No clinical bronchospasm.  Continue Dulera (substituting for home Symbicort) and as needed bronchodilator nebs.  Hypotension: Noted early morning hours on 5/1 with SBP in the 70s.  Improved after 250 mL IV fluid bolus.  Unclear etiology.  Discontinued Benadryl and all opioids.  Patient reports history of hypotension to any "strong medications".  Continue Tylenol for pain and if not controlled then could consider IV Toradol.  Suspected lung cancer with metastasis -PET scan shows of right middle lobe primary bronchogenic carcinoma with mediastinal, osseous and probable upper abdominal nodal metastasis, right-sided pneumothorax with persistent moderate right pleural effusion, metastasis within the right acetabulum, proximal right femur and possibly bilateral adrenal metastasis-also noted are a right renal mass and liver lesions which are not hypermetabolic-prior TRH MD discussed findings with patient and 2 daughters. -MRI brain on 4/18 revealed numerous brain mets with a dominant lesions being in the left frontal lobe - Will need bronchoscopy and tissue diagnosis if pleural fluid is unrevealing - PET scan shows cancer like lesion in the right acetabulum and right femur as well - He had an appointment with the oncologist on 4/30 which he missed due to being in the hospital  Body mass index is 19.94 kg/m./Underweight -Twice daily Ensure  Insomnia Discontinue Benadryl which may also be a cause of his hypotension.  Started melatonin as needed.  History of sciatica   ACP documents: None present.   Code Status: DNR Consultants: PCCM DVT prophylaxis:  heparin injection 5,000 Units Start: 04/10/23 2200 SCDs Start: 04/10/23 2127 Family communication: Patient declined MDs offer to speak with  family to update care.    Objective:   Vitals:   04/15/23 0424 04/15/23 0426 04/15/23 0737 04/15/23 0804  BP: 99/65 103/69 112/76   Pulse: 93 90 89   Resp: 18  16   Temp: 97.9 F (36.6 C)  98.1 F (36.7 C)   TempSrc: Oral  Oral   SpO2: 99%  100% 96%  Weight:      Height:       Filed Weights   04/10/23 1701  Weight: 61.2 kg   Stable without new findings today. Exam: General exam: Middle-age male, moderately built and thinly nourished sitting up comfortably in bed without distress. Respiratory system: Right-sided chest tube connected to underwater seal.  Slightly diminished breath sounds in the right base but otherwise clear to auscultation.  No increased work of breathing.  No change/stable compared to yesterday. Cardiovascular system: S1 & S2 heard, RRR. No JVD, murmurs, rubs, gallops or clicks. No pedal edema.  Telemetry personally reviewed: Sinus rhythm.  Discontinued telemetry 5/2. Gastrointestinal system: Abdomen is nondistended, soft and nontender. No organomegaly or masses felt. Normal bowel sounds heard. Central nervous system: Alert and oriented. No focal neurological deficits. Extremities: Symmetric 5 x 5 power. Skin: No rashes, lesions or ulcers Psychiatry: Judgement and insight appear normal. Mood & affect appropriate.     CBC: Recent Labs  Lab 04/10/23 1742 04/11/23 0410  WBC 14.1* 12.6*  NEUTROABS 11.9* 7.4  HGB 15.4 13.9  HCT 47.2 42.2  MCV 86.0 84.7  PLT 358 339   Basic Metabolic Panel: Recent Labs  Lab 04/10/23 1742 04/11/23 0410  NA 135 135  K 3.7 3.7  CL 101 100  CO2 24 26  GLUCOSE 139* 96  BUN 16 15  CREATININE 0.79 0.79  CALCIUM 9.0 8.9  MG  --  2.0   GFR: Estimated Creatinine Clearance: 77.6 mL/min (by C-G formula based on SCr of 0.79 mg/dL).  Scheduled Meds:  feeding supplement  237 mL Oral BID BM   heparin  5,000 Units Subcutaneous Q8H   mometasone-formoterol  2 puff Inhalation BID   multivitamin with minerals  1 tablet Oral  Daily   Continuous Infusions:   Imaging and lab data was personally reviewed DG CHEST PORT 1 VIEW  Result Date: 04/15/2023 CLINICAL DATA:  Follow-up right pneumothorax. EXAM: PORTABLE CHEST 1 VIEW COMPARISON:  04/14/2023 FINDINGS: Small approximately 10% right apical pneumothorax shows no significant change. Pleural pigtail catheter again seen along the right hemidiaphragm. No evidence of pulmonary infiltrate or pleural fluid. Heart size is normal. IMPRESSION: No significant change in small right apical pneumothorax. Electronically Signed   By: Danae Orleans M.D.   On: 04/15/2023 10:10   DG CHEST PORT 1 VIEW  Result Date: 04/14/2023 CLINICAL DATA:  Chest tube EXAM: PORTABLE CHEST 1 VIEW COMPARISON:  Portable exam 0611 hours compared to 04/13/2023 FINDINGS: Pigtail thoracostomy tube at inferior RIGHT hemithorax. Normal heart size, mediastinal contours, and pulmonary vascularity. Atherosclerotic calcification aorta. Emphysematous changes consistent with COPD. Small residual RIGHT apex pneumothorax. Remaining lungs clear. Skin folds project over LEFT upper lobe. Osseous structures unremarkable. Calcified mass RIGHT upper quadrant 3.1 cm diameter, known renal lesion by prior PET-CT. IMPRESSION: COPD changes with small residual RIGHT apex pneumothorax. Aortic Atherosclerosis (ICD10-I70.0)  and Emphysema (ICD10-J43.9). Electronically Signed   By: Ulyses Southward M.D.   On: 04/14/2023 08:40    LOS: 4 days   Marcellus Scott, MD,  FACP, [facility], [facility], [facility], [facility]   [facility] Ad[facility] [place]     To contact the attending provider between 7A-7P or the covering provider during after hours 7P-7A, please log into the web site [email or web address] and access using universal [place] password for that web site. If you do not have the password, please call the hospital operator.

## 2023-04-16 DIAGNOSIS — R918 Other nonspecific abnormal finding of lung field: Secondary | ICD-10-CM | POA: Diagnosis not present

## 2023-04-16 DIAGNOSIS — J948 Other specified pleural conditions: Secondary | ICD-10-CM | POA: Diagnosis not present

## 2023-04-16 DIAGNOSIS — Z4682 Encounter for fitting and adjustment of non-vascular catheter: Secondary | ICD-10-CM

## 2023-04-16 DIAGNOSIS — J90 Pleural effusion, not elsewhere classified: Secondary | ICD-10-CM

## 2023-04-16 NOTE — Progress Notes (Signed)
[facility] Progress Note  Patient: Theodore [name]     ZOX:[id number]  DOA: 04/10/2023   PCP: Gareth Morgan, MD       Brief hospital course: 67 year old male with COPD and new diagnosis of lung cancer who presented to the [facility] ED for worsening shortness of breath. On 4/25, he had a PET scan done which showed a right-sided hydropneumothorax.  He was transferred to [facility] and received a pigtail pleural catheter on 4/30 upon admission.  Unfortunately, he had a vasovagal response during the procedure and heart rate dropped to the 40s with blood pressures dropping to 60s/30s.  He was given a milligram of atropine.  Chest tube was left on waterseal, being managed by pulmonology.  Overnight 5/1 with episode of hypotension with BP 76/54, unclear etiology, improved after IVF bolus.  Subjective:  Denies complaints.  States that the chest tube was kinked overnight but has been fixed by nursing.  Assessment and Plan: Principal Problem:   Hydropneumothorax 4/30: Right-sided ultrasound guided pleural catheter insertion by PCCM.  Ongoing high pleural fluid output, 905 mL over the last 24 hours and total of 1955 since placement.  Exudative pleural effusion (7.8K WBCs, predominantly eosinophils and monocytes.  LDH 535, glucose 46).  Cultures negative to date.  Cytology still pending.  Serial chest x-ray being followed.  Chest x-ray 5/3: COPD changes with small residual right apical pneumothorax.  53 mL output documented over the last 24 hours.  As per communication with pulmonology, continue chest tube pending cytology results and if needs bronchoscopy, better to keep the chest tube until then.  Also need to monitor that the pneumothorax does not get worse on waterseal.  Pulmonology continues to follow and directing care.  Apart from this, there is not much else active going on at this time.  Chest x-ray 5/4: Stable small right apical pneumothorax.  Cytology results still  pending.  Active Problems:   COPD (chronic obstructive pulmonary disease) (HCC) No clinical bronchospasm.  Continue Dulera (substituting for home Symbicort) and as needed bronchodilator nebs.  Hypotension: Noted early morning hours on 5/1 with SBP in the 70s.  Improved after 250 mL IV fluid bolus.  Unclear etiology.  Discontinued Benadryl and all opioids.  Patient reports history of hypotension to any "strong medications".  Continue Tylenol for pain and if not controlled then could consider IV Toradol.  Suspected lung cancer with metastasis -PET scan shows of right middle lobe primary bronchogenic carcinoma with mediastinal, osseous and probable upper abdominal nodal metastasis, right-sided pneumothorax with persistent moderate right pleural effusion, metastasis within the right acetabulum, proximal right femur and possibly bilateral adrenal metastasis-also noted are a right renal mass and liver lesions which are not hypermetabolic-prior TRH MD discussed findings with patient and 2 daughters. -MRI brain on 4/18 revealed numerous brain mets with a dominant lesions being in the left frontal lobe - Will need bronchoscopy and tissue diagnosis if pleural fluid is unrevealing - PET scan shows cancer like lesion in the right acetabulum and right femur as well - He had an appointment with the oncologist on 4/30 which he missed due to being in the hospital  Body mass index is 19.94 kg/m./Underweight -Twice daily Ensure  Insomnia Discontinue Benadryl which may also be a cause of his hypotension.  Started melatonin as needed.  History of sciatica   ACP documents: None present.   Code Status: DNR Consultants: PCCM DVT prophylaxis:  heparin injection 5,000 Units Start: 04/10/23 2200 SCDs Start: 04/10/23  2127 Family communication: Patient declined MDs offer to speak with family to update care.    Objective:   Vitals:   04/15/23 2016 04/15/23 2022 04/16/23 0442 04/16/23 0700  BP: 104/78  119/76  121/83  Pulse: (!) 109  95 (!) 105  Resp:   17 18  Temp: 98.2 F (36.8 C)  97.9 F (36.6 C) 97.9 F (36.6 C)  TempSrc: Oral  Oral Oral  SpO2: 100% 95% 98% 99%  Weight:      Height:       Filed Weights   04/10/23 1701  Weight: 61.2 kg   Stable without new findings. Exam: General exam: Middle-age male, moderately built and thinly nourished sitting up comfortably in bed without distress. Respiratory system: Right-sided chest tube connected to underwater seal.  Slightly diminished breath sounds in the right base but otherwise clear to auscultation.  No increased work of breathing.  No change/stable compared to yesterday. Cardiovascular system: S1 & S2 heard, RRR. No JVD, murmurs, rubs, gallops or clicks. No pedal edema.  Telemetry personally reviewed: Sinus rhythm.  Discontinued telemetry 5/2. Gastrointestinal system: Abdomen is nondistended, soft and nontender. No organomegaly or masses felt. Normal bowel sounds heard. Central nervous system: Alert and oriented. No focal neurological deficits. Extremities: Symmetric 5 x 5 power. Skin: No rashes, lesions or ulcers Psychiatry: Judgement and insight appear normal. Mood & affect appropriate.     CBC: Recent Labs  Lab 04/10/23 1742 04/11/23 0410  WBC 14.1* 12.6*  NEUTROABS 11.9* 7.4  HGB 15.4 13.9  HCT 47.2 42.2  MCV 86.0 84.7  PLT 358 339   Basic Metabolic Panel: Recent Labs  Lab 04/10/23 1742 04/11/23 0410  NA 135 135  K 3.7 3.7  CL 101 100  CO2 24 26  GLUCOSE 139* 96  BUN 16 15  CREATININE 0.79 0.79  CALCIUM 9.0 8.9  MG  --  2.0   GFR: Estimated Creatinine Clearance: 77.6 mL/min (by C-G formula based on SCr of 0.79 mg/dL).  Scheduled Meds:  feeding supplement  237 mL Oral BID BM   heparin  5,000 Units Subcutaneous Q8H   mometasone-formoterol  2 puff Inhalation BID   multivitamin with minerals  1 tablet Oral Daily   Continuous Infusions:   Imaging and lab data was personally reviewed DG CHEST PORT 1  VIEW  Result Date: 04/15/2023 CLINICAL DATA:  Follow-up right pneumothorax. EXAM: PORTABLE CHEST 1 VIEW COMPARISON:  04/14/2023 FINDINGS: Small approximately 10% right apical pneumothorax shows no significant change. Pleural pigtail catheter again seen along the right hemidiaphragm. No evidence of pulmonary infiltrate or pleural fluid. Heart size is normal. IMPRESSION: No significant change in small right apical pneumothorax. Electronically Signed   By: Danae Orleans M.D.   On: 04/15/2023 10:10    LOS: 5 days   Marcellus Scott, MD,  FACP, [facility], [facility], [facility], [facility]   [facility] Ad[facility] [place]     To contact the attending provider between 7A-7P or the covering provider during after hours 7P-7A, please log into the web site [email or web address] and access using universal [place] password for that web site. If you do not have the password, please call the hospital operator.

## 2023-04-16 NOTE — Progress Notes (Signed)
   NAME:  Theodore [name], MRN:  [id number], DOB:  1955-12-17, LOS: 5 ADMISSION DATE:  04/10/2023, CONSULTATION DATE: 04/11/2023 REFERRING MD: [facility], CHIEF COMPLAINT: Presumed lung cancer and right hydropneumothorax  History of Present Illness:  67 year old male who gets his pulmonary care at [facility] has known lung mass and right pleural effusion hydropneumothorax and went to be treated at [facility].  He presented to [facility] acutely with dyspnea on exertion was transferred to [facility] for further evaluation and possible chest tube placement of right hydropneumothorax and the possibility of performing fiberoptic bronchoscopy for tissue diagnosis of his lung mass.  Pertinent  Medical History   Past Medical History:  Diagnosis Date   COPD (chronic obstructive pulmonary disease) (HCC)      Significant Hospital Events: Including procedures, antibiotic start and stop dates in addition to other pertinent events   04/11/2023 right chest tube insertion    Interim History / Subjective:   Denies complaints. No SOB. Has been up walking around.   Objective   Blood pressure 105/80, pulse 93, temperature 98.1 F (36.7 C), temperature source Oral, resp. rate 18, height 5\' 9"  (1.753 m), weight 61.2 kg, SpO2 100 %.        Intake/Output Summary (Last 24 hours) at 04/16/2023 1736 Last data filed at 04/16/2023 0800 Gross per 24 hour  Intake 360 ml  Output 775 ml  Net -415 ml    Filed Weights   04/10/23 1701  Weight: 61.2 kg    Examination: Chronically ill appearing man lying in bed in NAD [place]/AT, eyes anicteric Breathing comfortably on RA, CTAB Chest tube with no air leak. 320cc in atrium- amber colored fluid S1S2, RRR Abd thin, soft, NT Skin warm, dry, no rashes Awake, alert, moving all extremities.   Chest tube output: ~70cc on day shift today per RN Pleural fluid cytology pending   Assessment & Plan:  Right hydropneumothorax in  the setting of suspected lung cancer he has been evaluated [facility] but presented to [facility] with shortness of breath transferred to [facility] for possible fiberoptic bronchoscopy and chest tube insertion -chest tube to suction, repeat CXR in AM -may need to repeat cytology if negative or not back tomorrow morning -may still need bronch pending repeat cytology -ok to mobilize off suction  COPD -con't bronchodilators  Best Practice (right click and "Reselect all SmartList Selections" daily)   Per primary  Labs   CBC: Recent Labs  Lab 04/10/23 1742 04/11/23 0410  WBC 14.1* 12.6*  NEUTROABS 11.9* 7.4  HGB 15.4 13.9  HCT 47.2 42.2  MCV 86.0 84.7  PLT 358 339     Basic Metabolic Panel: Recent Labs  Lab 04/10/23 1742 04/11/23 0410  NA 135 135  K 3.7 3.7  CL 101 100  CO2 24 26  GLUCOSE 139* 96  BUN 16 15  CREATININE 0.79 0.79  CALCIUM 9.0 8.9  MG  --  2.0    GFR: Estimated Creatinine Clearance: 77.6 mL/min (by C-G formula based on SCr of 0.79 mg/dL). Recent Labs  Lab 04/10/23 1742 04/11/23 0410  PROCALCITON 0.12  --   WBC 14.1* 12.6*    Steffanie Dunn, DO 04/16/23 6:46 PM [facility]  For contact information, see Amion. If no response to pager, please call PCCM consult pager. After hours, 7PM- 7AM, please call Elink.

## 2023-04-17 ENCOUNTER — Inpatient Hospital Stay (HOSPITAL_COMMUNITY): Payer: Medicare Other

## 2023-04-17 DIAGNOSIS — J948 Other specified pleural conditions: Secondary | ICD-10-CM | POA: Diagnosis not present

## 2023-04-17 LAB — CYTOLOGY - NON PAP

## 2023-04-17 NOTE — Progress Notes (Signed)
[facility] Progress Note  Patient: Theodore [name]     WUJ:[id number]  DOA: 04/10/2023   PCP: Gareth Morgan, MD       Brief hospital course: 67 year old male with COPD and new diagnosis of lung cancer who presented to the [facility] ED for worsening shortness of breath. On 4/25, he had a PET scan done which showed a right-sided hydropneumothorax.  He was transferred to [facility] and received a pigtail pleural catheter on 4/30 upon admission.  Unfortunately, he had a vasovagal response during the procedure and heart rate dropped to the 40s with blood pressures dropping to 60s/30s.  He was given a milligram of atropine.   Over the last several days, patient has been on right-sided chest tube and stable.  Awaiting pleural fluid results and final decision from PCCM regarding chest tube management and further evaluation.  Subjective:  No complaints reported.  Assessment and Plan: Principal Problem:   Hydropneumothorax 4/30: Right-sided ultrasound guided pleural catheter insertion by PCCM.  Ongoing high pleural fluid output, 905 mL over the last 24 hours and total of 1955 since placement.  Exudative pleural effusion (7.8K WBCs, predominantly eosinophils and monocytes.  LDH 535, glucose 46).  Cultures negative to date.  Cytology still pending.  Serial chest x-ray being followed.  Chest x-ray 5/3: COPD changes with small residual right apical pneumothorax.  53 mL output documented over the last 24 hours.  As per communication with pulmonology, continue chest tube pending cytology results and if needs bronchoscopy, better to keep the chest tube until then.  Also need to monitor that the pneumothorax does not get worse on waterseal.  Pulmonology continues to follow and directing care.  Apart from this, there is not much else active going on at this time.  Chest x-ray 5/4: Stable small right apical pneumothorax.  Cytology results from 4/30 remain pending.  Active Problems:    COPD (chronic obstructive pulmonary disease) (HCC) No clinical bronchospasm.  Continue Dulera (substituting for home Symbicort) and as needed bronchodilator nebs.  Hypotension: Noted early morning hours on 5/1 with SBP in the 70s.  Improved after 250 mL IV fluid bolus.  Unclear etiology.  Discontinued Benadryl and all opioids.  Patient reports history of hypotension to any "strong medications".  Continue Tylenol for pain and if not controlled then could consider IV Toradol.  Suspected lung cancer with metastasis -PET scan shows of right middle lobe primary bronchogenic carcinoma with mediastinal, osseous and probable upper abdominal nodal metastasis, right-sided pneumothorax with persistent moderate right pleural effusion, metastasis within the right acetabulum, proximal right femur and possibly bilateral adrenal metastasis-also noted are a right renal mass and liver lesions which are not hypermetabolic-prior TRH MD discussed findings with patient and 2 daughters. -MRI brain on 4/18 revealed numerous brain mets with a dominant lesions being in the left frontal lobe - Will need bronchoscopy and tissue diagnosis if pleural fluid is unrevealing - PET scan shows cancer like lesion in the right acetabulum and right femur as well - He had an appointment with the oncologist on 4/30 which he missed due to being in the hospital  Body mass index is 19.94 kg/m./Underweight -Twice daily Ensure  Insomnia Discontinue Benadryl which may also be a cause of his hypotension.  Started melatonin as needed.  History of sciatica   ACP documents: None present.   Code Status: DNR Consultants: PCCM DVT prophylaxis:  heparin injection 5,000 Units Start: 04/10/23 2200 SCDs Start: 04/10/23 2127 Family communication: Patient has declined  MDs offer to speak with family to update care.    Objective:   Vitals:   04/16/23 1538 04/16/23 2114 04/17/23 0531 04/17/23 0820  BP: 105/80 113/77 119/78 (!) 116/95  Pulse:  93 99 88 (!) 105  Resp: 18 18 18 17   Temp: 98.1 F (36.7 C) 98.6 F (37 C) 98.1 F (36.7 C) 97.7 F (36.5 C)  TempSrc: Oral Oral Oral Oral  SpO2: 100% 100% 99% 99%  Weight:      Height:       Filed Weights   04/10/23 1701  Weight: 61.2 kg   Stable without new findings. Exam: General exam: Middle-age male, moderately built and thinly nourished sitting up in reclining chair without distress. Respiratory system: Right-sided chest tube connected to underwater seal.  Minimally diminished breath sounds in the right base but otherwise clear to auscultation.  No increased work of breathing. Cardiovascular system: S1 & S2 heard, RRR. No JVD, murmurs, rubs, gallops or clicks. No pedal edema.  Off of telemetry. Gastrointestinal system: Abdomen is nondistended, soft and nontender. No organomegaly or masses felt. Normal bowel sounds heard. Central nervous system: Alert and oriented. No focal neurological deficits. Extremities: Symmetric 5 x 5 power. Skin: No rashes, lesions or ulcers Psychiatry: Judgement and insight appear normal. Mood & affect appropriate.     CBC: Recent Labs  Lab 04/10/23 1742 04/11/23 0410  WBC 14.1* 12.6*  NEUTROABS 11.9* 7.4  HGB 15.4 13.9  HCT 47.2 42.2  MCV 86.0 84.7  PLT 358 339   Basic Metabolic Panel: Recent Labs  Lab 04/10/23 1742 04/11/23 0410  NA 135 135  K 3.7 3.7  CL 101 100  CO2 24 26  GLUCOSE 139* 96  BUN 16 15  CREATININE 0.79 0.79  CALCIUM 9.0 8.9  MG  --  2.0   GFR: Estimated Creatinine Clearance: 77.6 mL/min (by C-G formula based on SCr of 0.79 mg/dL).  Scheduled Meds:  feeding supplement  237 mL Oral BID BM   heparin  5,000 Units Subcutaneous Q8H   mometasone-formoterol  2 puff Inhalation BID   multivitamin with minerals  1 tablet Oral Daily   Continuous Infusions:   Imaging and lab data was personally reviewed DG CHEST PORT 1 VIEW  Result Date: 04/17/2023 CLINICAL DATA:  Chest tube EXAM: PORTABLE CHEST 1 VIEW  COMPARISON:  Portable exam 0533 hours compared to 04/15/2023 FINDINGS: Pigtail drainage catheter at LEFT lung base versus RIGHT upper quadrant. Normal heart size, mediastinal contours, and pulmonary vascularity. Atherosclerotic calcification aorta. Increased opacity at medial RIGHT lung base could represent atelectasis, loculated fluid, no mass at this site by recent PET-CT. Lungs appear emphysematous with skin folds projecting over RIGHT upper lobe on 1. Minimal subsegmental atelectasis RIGHT upper lobe. No additional infiltrate or effusion. Trace RIGHT apex pneumothorax. IMPRESSION: Trace RIGHT apex pneumothorax decreased from 04/15/2023. Electronically Signed   By: Ulyses Southward M.D.   On: 04/17/2023 12:54    LOS: 6 days   Marcellus Scott, MD,  FACP, [facility], [facility], [facility], [facility]   [facility] Physician Advisor [place]     To contact the attending provider between 7A-7P or the covering provider during after hours 7P-7A, please log into the web site [email or web address] and access using universal [place] password for that web site. If you do not have the password, please call the hospital operator.

## 2023-04-17 NOTE — Progress Notes (Addendum)
NAME:  Theodore [name], MRN:  [id number], DOB:  November 14, 1956, LOS: 6 ADMISSION DATE:  04/10/2023, CONSULTATION DATE: 04/11/2023 REFERRING MD: [facility], CHIEF COMPLAINT: Presumed lung cancer and right hydropneumothorax  History of Present Illness:  67 year old male who gets his pulmonary care at [facility] has known lung mass and right pleural effusion hydropneumothorax and went to be treated at [facility].  He presented to [facility] acutely with dyspnea on exertion was transferred to [facility] for further evaluation and possible chest tube placement of right hydropneumothorax and the possibility of performing fiberoptic bronchoscopy for tissue diagnosis of his lung mass.  Pertinent  Medical History   Past Medical History:  Diagnosis Date   COPD (chronic obstructive pulmonary disease) (HCC)      Significant Hospital Events: Including procedures, antibiotic start and stop dates in addition to other pertinent events   04/11/2023 right chest tube insertion    Interim History / Subjective:   Denies complaints. No SOB. Has been up walking around.   Some insertion site pain   Objective   Blood pressure (!) 116/95, pulse (!) 105, temperature 97.7 F (36.5 C), temperature source Oral, resp. rate 17, height 5\' 9"  (1.753 m), weight 61.2 kg, SpO2 99 %.        Intake/Output Summary (Last 24 hours) at 04/17/2023 1410 Last data filed at 04/17/2023 0820 Gross per 24 hour  Intake 240 ml  Output 575 ml  Net -335 ml   Filed Weights   04/10/23 1701  Weight: 61.2 kg    Examination: Chronically ill appearing man sitting in chair, alert  [place]/AT, eyes anicteric Breathing comfortably on RA, CTAB, Right sided  Chest tube with no air leak. 400cc in atrium- amber colored fluid S1S2, RRR Abd thin, soft, NT Skin warm, dry, no rashes Awake, alert, moving all extremities.   CXR 5/6>> Increased opacity at medial RIGHT lung base could represent atelectasis,  loculated fluid, no mass at this site by recent PET-CT. Trace RIGHT apex pneumothorax decreased from 04/15/2023.   Chest tube output: ~75cc last 24 hours Pleural fluid cytology remains pending on 5/6, I called Pathology.  Dr. Vonna Drafts received the sample 5/2 and ordered additional immuno studies. They came back today, and he stated that it should result in an hour or so today 5/6.  I will have one of my follow up in an hour so we can let patient know as he is anxious to get the results.   Assessment & Plan:  Right hydropneumothorax in the setting of suspected lung cancer he has been evaluated [place] but presented to [facility] with shortness of breath transferred to [facility] for possible fiberoptic bronchoscopy and chest tube insertion -chest tube to suction, repeat CXR in AM -Cytology pending now, but called pathology and should result in the next hour 5/6 -may still need bronch pending repeat cytology -ok to mobilize off suction  COPD -con't bronchodilators  Best Practice (right click and "Reselect all SmartList Selections" daily)   Per primary  Labs   CBC: Recent Labs  Lab 04/10/23 1742 04/11/23 0410  WBC 14.1* 12.6*  NEUTROABS 11.9* 7.4  HGB 15.4 13.9  HCT 47.2 42.2  MCV 86.0 84.7  PLT 358 339    Basic Metabolic Panel: Recent Labs  Lab 04/10/23 1742 04/11/23 0410  NA 135 135  K 3.7 3.7  CL 101 100  CO2 24 26  GLUCOSE 139* 96  BUN 16 15  CREATININE  0.79 0.79  CALCIUM 9.0 8.9  MG  --  2.0   GFR: Estimated Creatinine Clearance: 77.6 mL/min (by C-G formula based on SCr of 0.79 mg/dL). Recent Labs  Lab 04/10/23 1742 04/11/23 0410  PROCALCITON 0.12  --   WBC 14.1* 12.6*   Bevelyn Ngo, MSN, AGACNP-BC [facility] Pulmonary/Critical Care Medicine See Amion for personal pager PCCM on call pager [phone]  04/17/2023 2:10 PM  For contact information, see Amion. If no response to pager, please call PCCM consult  pager. After hours, 7PM- 7AM, please call Elink.

## 2023-04-18 ENCOUNTER — Inpatient Hospital Stay (HOSPITAL_COMMUNITY): Payer: Medicare Other | Admitting: Registered Nurse

## 2023-04-18 ENCOUNTER — Inpatient Hospital Stay (HOSPITAL_COMMUNITY): Payer: Medicare Other

## 2023-04-18 ENCOUNTER — Encounter (HOSPITAL_COMMUNITY): Payer: Self-pay | Admitting: Internal Medicine

## 2023-04-18 ENCOUNTER — Encounter (HOSPITAL_COMMUNITY)
Admission: EM | Disposition: A | Discharge status: Home / Self Care | Payer: Self-pay | Source: Ambulatory Visit | Attending: Internal Medicine

## 2023-04-18 ENCOUNTER — Ambulatory Visit (HOSPITAL_COMMUNITY): Admit: 2023-04-18 | Payer: Medicare Other | Admitting: Pulmonary Disease

## 2023-04-18 DIAGNOSIS — C342 Malignant neoplasm of middle lobe, bronchus or lung: Secondary | ICD-10-CM

## 2023-04-18 DIAGNOSIS — J449 Chronic obstructive pulmonary disease, unspecified: Secondary | ICD-10-CM | POA: Diagnosis not present

## 2023-04-18 DIAGNOSIS — J948 Other specified pleural conditions: Secondary | ICD-10-CM | POA: Diagnosis not present

## 2023-04-18 DIAGNOSIS — Z87891 Personal history of nicotine dependence: Secondary | ICD-10-CM | POA: Diagnosis not present

## 2023-04-18 HISTORY — PX: BRONCHIAL BIOPSY: SHX5109

## 2023-04-18 HISTORY — PX: BRONCHIAL NEEDLE ASPIRATION BIOPSY: SHX5106

## 2023-04-18 LAB — SARS CORONAVIRUS 2 BY RT PCR: SARS Coronavirus 2 by RT PCR: NEGATIVE

## 2023-04-18 SURGERY — BRONCHOSCOPY, WITH BIOPSY USING ELECTROMAGNETIC NAVIGATION
Anesthesia: General | Laterality: Bilateral

## 2023-04-18 MED ORDER — MEPERIDINE HCL 25 MG/ML IJ SOLN: 6.2500 mg | INTRAMUSCULAR | Status: DC | PRN

## 2023-04-18 MED ORDER — HYDROMORPHONE HCL 1 MG/ML IJ SOLN: 0.2500 mg | INTRAMUSCULAR | Status: DC | PRN

## 2023-04-18 MED ORDER — ONDANSETRON HCL 4 MG/2ML IJ SOLN
INTRAMUSCULAR | Status: DC | PRN
  Administered 2023-04-18: 4 mg via INTRAVENOUS

## 2023-04-18 MED ORDER — PROPOFOL 10 MG/ML IV BOLUS
INTRAVENOUS | Status: DC | PRN
  Administered 2023-04-18: 120 mg via INTRAVENOUS

## 2023-04-18 MED ORDER — ROCURONIUM BROMIDE 10 MG/ML (PF) SYRINGE
PREFILLED_SYRINGE | INTRAVENOUS | Status: DC | PRN
  Administered 2023-04-18: 20 mg via INTRAVENOUS

## 2023-04-18 MED ORDER — OXYCODONE HCL 5 MG PO TABS: 5.0000 mg | ORAL_TABLET | Freq: Once | ORAL | Status: DC | PRN

## 2023-04-18 MED ORDER — PROMETHAZINE HCL 25 MG/ML IJ SOLN: 6.2500 mg | INTRAMUSCULAR | Status: DC | PRN

## 2023-04-18 MED ORDER — LIDOCAINE 2% (20 MG/ML) 5 ML SYRINGE
INTRAMUSCULAR | Status: DC | PRN
  Administered 2023-04-18: 60 mg via INTRAVENOUS

## 2023-04-18 MED ORDER — OXYCODONE HCL 5 MG/5ML PO SOLN: 5.0000 mg | Freq: Once | ORAL | Status: DC | PRN

## 2023-04-18 MED ORDER — DEXAMETHASONE SODIUM PHOSPHATE 10 MG/ML IJ SOLN
INTRAMUSCULAR | Status: DC | PRN
  Administered 2023-04-18: 5 mg via INTRAVENOUS

## 2023-04-18 MED ORDER — AMISULPRIDE (ANTIEMETIC) 5 MG/2ML IV SOLN: 10.0000 mg | Freq: Once | INTRAVENOUS | Status: DC | PRN

## 2023-04-18 MED ORDER — SUGAMMADEX SODIUM 200 MG/2ML IV SOLN
INTRAVENOUS | Status: DC | PRN
  Administered 2023-04-18: 200 mg via INTRAVENOUS

## 2023-04-18 MED ORDER — PHENYLEPHRINE HCL-NACL 20-0.9 MG/250ML-% IV SOLN
INTRAVENOUS | Status: DC | PRN
  Administered 2023-04-18: 50 ug/min via INTRAVENOUS

## 2023-04-18 MED ORDER — SUCCINYLCHOLINE CHLORIDE 200 MG/10ML IV SOSY
PREFILLED_SYRINGE | INTRAVENOUS | Status: DC | PRN
  Administered 2023-04-18: 120 mg via INTRAVENOUS

## 2023-04-18 MED ORDER — LACTATED RINGERS IV SOLN: INTRAVENOUS | Status: DC | PRN

## 2023-04-18 NOTE — Progress Notes (Signed)
Nutrition Follow-up  DOCUMENTATION CODES:   Severe malnutrition in context of social or environmental circumstances  INTERVENTION:  Continue Multivitamin w/ minerals daily Continue Ensure Enlive po BID, each supplement provides 350 kcal and 20 grams of protein.  NUTRITION DIAGNOSIS:   Moderate Malnutrition related to social / environmental circumstances as evidenced by severe fat depletion, moderate muscle depletion, severe muscle depletion, moderate fat depletion. - Ongoing   GOAL:   Patient will meet greater than or equal to 90% of their needs - Progressing   MONITOR:   PO intake, Labs, I & O's, Supplement acceptance, Weight trends  REASON FOR ASSESSMENT:   Consult Assessment of nutrition requirement/status  ASSESSMENT:   67 y.o. male with PMHx including COPD, suspected metastatic lung cancer who presents with worsening dyspnea  4/29 - admitted  4/30 - R chest tube placed  5/07 - s/p bronchoscopy  Pt sitting in chair at time of RD visit. Reports that he has been eating fairly well, just the food is not how he likes it and would cook it at home. Visitors have continued to bring him in food as well. Denies any difficulty calling and ordering meals, shares that food always arrives on time. He reports drinking some of the Ensure's and prefers the chocolate flavor. Shared that he has an appointment with the dietitian at the cancer center in a few weeks.   No output from chest tube documented within the past 24 hours.   Meal Intake  5/03-5/06: 30-100% x 8 meals (average 77%)  Medications reviewed and include: MVI Labs reviewed.  Diet Order:   Diet Order             Diet regular Room service appropriate? Yes; Fluid consistency: Thin  Diet effective now                   EDUCATION NEEDS:   Education needs have been addressed  Skin:  Skin Assessment: Reviewed RN Assessment  Last BM:  5/4  Height:   Ht Readings from Last 1 Encounters:  04/10/23 5\' 9"   (1.753 m)    Weight:   Wt Readings from Last 1 Encounters:  04/10/23 61.2 kg    Ideal Body Weight:  72.7 kg  BMI:  Body mass index is 19.94 kg/m.  Estimated Nutritional Needs:  Kcal:  1800-2100 kcal Protein:  75-95 g Fluid:  >1.8 L    Kirby Crigler RD, LDN Clinical Dietitian See [facility] for contact information.

## 2023-04-18 NOTE — Progress Notes (Signed)
Pt returned to room 6 north room 20 alert and oriented x4. Pain level 0. Pt ambulated to chair with no issues. Call light in reach. Will continue to monitor pt.

## 2023-04-18 NOTE — Progress Notes (Signed)
Pt arrived back to 6 north room 20 alert and oriented x4. Pt ambulated from wheelchair to chair with no issues. Call light in reach. All needs met at this time.

## 2023-04-18 NOTE — Discharge Instructions (Signed)

## 2023-04-18 NOTE — Anesthesia Procedure Notes (Signed)
Procedure Name: Intubation Date/Time: 04/18/2023 1:38 PM  Performed by: Loleta Leslie Langille, CRNAPre-anesthesia Checklist: Patient identified, Patient being monitored, Timeout performed, Emergency Drugs available and Suction available Patient Re-evaluated:Patient Re-evaluated prior to induction Oxygen Delivery Method: Circle system utilized Preoxygenation: Pre-oxygenation with 100% oxygen Induction Type: IV induction Ventilation: Mask ventilation without difficulty Laryngoscope Size: Mac and 4 Grade View: Grade I Tube type: Oral Tube size: 8.5 mm Number of attempts: 1 Airway Equipment and Method: Stylet Placement Confirmation: ETT inserted through vocal cords under direct vision, positive ETCO2 and breath sounds checked- equal and bilateral Secured at: 23 cm Tube secured with: Tape Dental Injury: Teeth and Oropharynx as per pre-operative assessment

## 2023-04-18 NOTE — Progress Notes (Signed)
Regular tray ordered for pt.

## 2023-04-18 NOTE — Progress Notes (Signed)
This nurse was checking pt vitals. Pt made this nurse aware that their chest tube was leaking. Noted pt gown and pants were wet. This nurse and charge nurse reinforced the dressing for the chest tube. This nurse notified J. Garner Nash, NP to make aware. NP advised to reach CCM to have chest tube checked. E-Link notified of situation and will notify team awaiting for visit at bedside.

## 2023-04-18 NOTE — Progress Notes (Signed)
   04/18/23 1807  Assess: MEWS Score  Temp 98.4 F (36.9 C)  BP 108/76  MAP (mmHg) 86  Pulse Rate (!) 114  Resp 17  Level of Consciousness Alert  SpO2 100 %  O2 Device Room Air  Assess: MEWS Score  MEWS Temp 0  MEWS Systolic 0  MEWS Pulse 2  MEWS RR 0  MEWS LOC 0  MEWS Score 2  MEWS Score Color Yellow  Assess: if the MEWS score is Yellow or Red  Were vital signs taken at a resting state? Yes  Focused Assessment No change from prior assessment  Does the patient meet 2 or more of the SIRS criteria? No  Does the patient have a confirmed or suspected source of infection? No  MEWS guidelines implemented  Yes, yellow  Treat  MEWS Interventions Considered administering scheduled or prn medications/treatments as ordered  Take Vital Signs  Increase Vital Sign Frequency  Yellow: Q2hr x1, continue Q4hrs until patient remains green for 12hrs  Escalate  MEWS: Escalate Yellow: Discuss with charge nurse and consider notifying provider and/or RRT  Notify: Charge Nurse/RN  Name of Charge Nurse/RN Notified Hart Carwin, RN  Provider Notification  Provider Name/Title Hongalgi,MD  Date Provider Notified 04/18/23  Time Provider Notified 1820  Method of Notification Page  Notification Reason Other (Comment) (Yellow MEWS)  Provider response No new orders;Other (Comment) (update PCCM team who did procedure today)  Date of Provider Response 04/18/23  Time of Provider Response 1821  Assess: SIRS CRITERIA  SIRS Temperature  0  SIRS Pulse 1  SIRS Respirations  0  SIRS WBC 0  SIRS Score Sum  1

## 2023-04-18 NOTE — H&P (View-Only) (Signed)
   NAME:  Theodore [name], MRN:  [id number], DOB:  08/10/1956, LOS: 7 ADMISSION DATE:  04/10/2023, CONSULTATION DATE: 04/11/2023 REFERRING MD: Triad, CHIEF COMPLAINT: Presumed lung cancer and right hydropneumothorax  History of Present Illness:  67-year-old male who gets his pulmonary care at [facility] has known lung mass and right pleural effusion hydropneumothorax and went to be treated at [facility].  He presented to [facility] acutely with dyspnea on exertion was transferred to [facility] for further evaluation and possible chest tube placement of right hydropneumothorax and the possibility of performing fiberoptic bronchoscopy for tissue diagnosis of his lung mass.  Pertinent  Medical History   Past Medical History:  Diagnosis Date   COPD (chronic obstructive pulmonary disease) (HCC)      Significant Hospital Events: Including procedures, antibiotic start and stop dates in addition to other pertinent events   04/11/2023 right chest tube insertion 04/18/2023 plan for fiberoptic bronchoscopy   Interim History / Subjective:   Some concern about plan for interventions today.  Updated extensively.  Objective   Blood pressure (!) 140/83, pulse 100, temperature 98 F (36.7 C), temperature source Oral, resp. rate 19, height 5' 9" (1.753 m), weight 61.2 kg, SpO2 98 %.        Intake/Output Summary (Last 24 hours) at 04/18/2023 1120 Last data filed at 04/17/2023 1530 Gross per 24 hour  Intake 480 ml  Output --  Net 480 ml   Filed Weights   04/10/23 1701  Weight: 61.2 kg    Examination: Chronically ill-appearing male no acute distress No JVD is appreciated Right chest tube is currently clamped diminished breath sounds in the right Plan leave chest tube in until after fiberoptic bronchoscopy Heart sounds are regular Abdomen soft nontender Extremities warm and dry  Assessment & Plan:  Right hydropneumothorax in the setting of  suspected lung cancer he has been evaluated [facility] but presented to [facility] with shortness of breath transferred to [facility] for possible fiberoptic bronchoscopy and chest tube insertion Chest tube is currently clamped Plan is for fiberoptic bronchoscopy today Follow-up in a.m.  COPD -con't bronchodilators  Best Practice (right click and "Reselect all SmartList Selections" daily)   Per primary  Labs   CBC: No results for input(s): "WBC", "NEUTROABS", "HGB", "HCT", "MCV", "PLT" in the last 168 hours.   Basic Metabolic Panel: No results for input(s): "NA", "K", "CL", "CO2", "GLUCOSE", "BUN", "CREATININE", "CALCIUM", "MG", "PHOS" in the last 168 hours.  GFR: Estimated Creatinine Clearance: 77.6 mL/min (by C-G formula based on SCr of 0.79 mg/dL). No results for input(s): "PROCALCITON", "WBC", "LATICACIDVEN" in the last 168 hours.  Steve Cordarro Spinnato ACNP Acute Care Nurse Practitioner [facility] Please consult Amion 04/18/2023, 11:21 AM      

## 2023-04-18 NOTE — Progress Notes (Signed)
Pt transported to CT via wheelchair by transportation staff 

## 2023-04-18 NOTE — Progress Notes (Signed)
COVID testing collected and sent to lab per order

## 2023-04-18 NOTE — Transfer of Care (Signed)
Immediate Anesthesia Transfer of Care Note  Patient: Theodore [name]  Procedure(s) Performed: ROBOTIC ASSISTED NAVIGATIONAL BRONCHOSCOPY (Bilateral)  Patient Location: PACU  Anesthesia Type:General  Level of Consciousness: awake  Airway & Oxygen Therapy: Patient Spontanous Breathing  Post-op Assessment: Report given to RN and Post -op Vital signs reviewed and stable  Post vital signs: Reviewed and stable  Last Vitals:  Vitals Value Taken Time  BP 113/86 04/18/23 1425  Temp 36.6 C 04/18/23 1425  Pulse 103 04/18/23 1425  Resp 21 04/18/23 1425  SpO2 99 % 04/18/23 1425  Vitals shown include unvalidated device data.  Last Pain:  Vitals:   04/18/23 1235  TempSrc: Temporal  PainSc: 0-No pain      Patients Stated Pain Goal: 0 (04/13/23 0218)  Complications: No notable events documented.

## 2023-04-18 NOTE — Anesthesia Preprocedure Evaluation (Signed)
Anesthesia Evaluation  Patient identified by MRN, date of birth, ID band Patient awake    Reviewed: Allergy & Precautions, H&P , NPO status , Patient's Chart, lab work & pertinent test results  Airway Mallampati: I  TM Distance: >3 FB Neck ROM: full    Dental no notable dental hx.    Pulmonary neg pulmonary ROS, COPD, former smoker   Pulmonary exam normal breath sounds clear to auscultation       Cardiovascular Exercise Tolerance: Good negative cardio ROS  Rhythm:regular Rate:Normal     Neuro/Psych negative neurological ROS  negative psych ROS   GI/Hepatic negative GI ROS, Neg liver ROS,,,  Endo/Other  negative endocrine ROS    Renal/GU negative Renal ROS  negative genitourinary   Musculoskeletal   Abdominal   Peds  Hematology negative hematology ROS (+)   Anesthesia Other Findings Lung Mass  Reproductive/Obstetrics negative OB ROS                             Anesthesia Physical Anesthesia Plan  ASA: 3  Anesthesia Plan: General   Post-op Pain Management: Minimal or no pain anticipated   Induction: Intravenous  PONV Risk Score and Plan: 2 and Ondansetron, Midazolam and Treatment may vary due to age or medical condition  Airway Management Planned: Oral ETT  Additional Equipment:   Intra-op Plan:   Post-operative Plan: Extubation in OR  Informed Consent: I have reviewed the patients History and Physical, chart, labs and discussed the procedure including the risks, benefits and alternatives for the proposed anesthesia with the patient or authorized representative who has indicated his/her understanding and acceptance.     Dental Advisory Given  Plan Discussed with: CRNA  Anesthesia Plan Comments:         Anesthesia Quick Evaluation

## 2023-04-18 NOTE — Progress Notes (Signed)
Pt transported to endo via wheelchair by transportation staff

## 2023-04-18 NOTE — Interval H&P Note (Signed)
History and Physical Interval Note:  04/18/2023 1:20 PM  Theodore [name]  has presented today for surgery, with the diagnosis of lung nodule.  The various methods of treatment have been discussed with the patient and family. After consideration of risks, benefits and other options for treatment, the patient has consented to  Procedure(s): ROBOTIC ASSISTED NAVIGATIONAL BRONCHOSCOPY (Bilateral) as a surgical intervention.  The patient's history has been reviewed, patient examined, no change in status, stable for surgery.  I have reviewed the patient's chart and labs.  Questions were answered to the patient's satisfaction.     Rachel Bo Glennette Galster

## 2023-04-18 NOTE — Op Note (Signed)
Video Bronchoscopy with Robotic Assisted Bronchoscopic Navigation   Date of Operation: 04/18/2023   Pre-op Diagnosis: Right middle lobe lung nodule  Post-op Diagnosis: Right middle lobe lung nodule  Surgeon: Josephine Igo, DO  Assistants: none   Anesthesia: General endotracheal anesthesia  Operation: Flexible video fiberoptic bronchoscopy with robotic assistance and biopsies.  Estimated Blood Loss: Minimal  Complications: None  Indications and History: Theodore [name] is a 67 y.o. male with history of right middle lobe lung nodule. The risks, benefits, complications, treatment options and expected outcomes were discussed with the patient.  The possibilities of pneumothorax, pneumonia, reaction to medication, pulmonary aspiration, perforation of a viscus, bleeding, failure to diagnose a condition and creating a complication requiring transfusion or operation were discussed with the patient who freely signed the consent.    Description of Procedure: The patient was seen in the Preoperative Area, was examined and was deemed appropriate to proceed.  The patient was taken to [facility] endoscopy room 3, identified as Theodore [name] and the procedure verified as Flexible Video Fiberoptic Bronchoscopy.  A Time Out was held and the above information confirmed.   Prior to the date of the procedure a high-resolution CT scan of the chest was performed. Utilizing ION software program a virtual tracheobronchial tree was generated to allow the creation of distinct navigation pathways to the patient's parenchymal abnormalities. After being taken to the operating room general anesthesia was initiated and the patient  was orally intubated. The video fiberoptic bronchoscope was introduced via the endotracheal tube and a general inspection was performed which showed normal right and left lung anatomy, aspiration of the bilateral mainstems was completed to remove any remaining secretions. Robotic catheter inserted into  patient's endotracheal tube.   Target #1 right middle lobe lung nodule: The distinct navigation pathways prepared prior to this procedure were then utilized to navigate to patient's lesion identified on CT scan. The robotic catheter was secured into place and the vision probe was withdrawn.  Lesion location was approximated using fluoroscopy and three-dimensional cone beam CT imaging for CT-guided needle placement and for peripheral targeting. Under fluoroscopic guidance transbronchial needle biopsies, and transbronchial forceps biopsies were performed to be sent for cytology and pathology.  At the end of the procedure a general airway inspection was performed and there was no evidence of active bleeding. The bronchoscope was removed.  The patient tolerated the procedure well. There was no significant blood loss and there were no obvious complications. A post-procedural chest x-ray is pending.  Samples Target #1: 1. Transbronchial Wang needle biopsies from RML  2. Transbronchial forceps biopsies from RML  Plans:  The patient will be discharged from the PACU to home when recovered from anesthesia and after chest x-ray is reviewed. We will review the cytology, pathology results with the patient when they become available. Outpatient followup will be with Josephine Igo, DO.  Josephine Igo, DO [facility] 04/18/2023 2:22 PM

## 2023-04-18 NOTE — Progress Notes (Signed)
   NAME:  Theodore [name], MRN:  [id number], DOB:  1956/03/28, LOS: 7 ADMISSION DATE:  04/10/2023, CONSULTATION DATE: 04/11/2023 REFERRING MD: Triad, CHIEF COMPLAINT: Presumed lung cancer and right hydropneumothorax  History of Present Illness:  67 year old male who gets his pulmonary care at [facility] has known lung mass and right pleural effusion hydropneumothorax and went to be treated at [facility].  He presented to [facility] acutely with dyspnea on exertion was transferred to [facility] for further evaluation and possible chest tube placement of right hydropneumothorax and the possibility of performing fiberoptic bronchoscopy for tissue diagnosis of his lung mass.  Pertinent  Medical History   Past Medical History:  Diagnosis Date   COPD (chronic obstructive pulmonary disease) (HCC)      Significant Hospital Events: Including procedures, antibiotic start and stop dates in addition to other pertinent events   04/11/2023 right chest tube insertion 04/18/2023 plan for fiberoptic bronchoscopy   Interim History / Subjective:   Some concern about plan for interventions today.  Updated extensively.  Objective   Blood pressure (!) 140/83, pulse 100, temperature 98 F (36.7 C), temperature source Oral, resp. rate 19, height 5\' 9"  (1.753 m), weight 61.2 kg, SpO2 98 %.        Intake/Output Summary (Last 24 hours) at 04/18/2023 1120 Last data filed at 04/17/2023 1530 Gross per 24 hour  Intake 480 ml  Output --  Net 480 ml   Filed Weights   04/10/23 1701  Weight: 61.2 kg    Examination: Chronically ill-appearing male no acute distress No JVD is appreciated Right chest tube is currently clamped diminished breath sounds in the right Plan leave chest tube in until after fiberoptic bronchoscopy Heart sounds are regular Abdomen soft nontender Extremities warm and dry  Assessment & Plan:  Right hydropneumothorax in the setting of  suspected lung cancer he has been [facility] but [facility] to [facility] with shortness of breath transferred to [facility] for possible fiberoptic bronchoscopy and chest tube insertion Chest tube is currently clamped Plan is for fiberoptic bronchoscopy today Follow-up in a.m.  COPD -con't bronchodilators  Best Practice (right click and "Reselect all SmartList Selections" daily)   Per primary  Labs   CBC: No results for input(s): "WBC", "NEUTROABS", "HGB", "HCT", "MCV", "PLT" in the last 168 hours.   Basic Metabolic Panel: No results for input(s): "NA", "K", "CL", "CO2", "GLUCOSE", "BUN", "CREATININE", "CALCIUM", "MG", "PHOS" in the last 168 hours.  GFR: Estimated Creatinine Clearance: 77.6 mL/min (by C-G formula based on SCr of 0.79 mg/dL). No results for input(s): "PROCALCITON", "WBC", "LATICACIDVEN" in the last 168 hours.  [name] Amiaya Mcneeley ACNP Acute Care Nurse Practitioner [facility] Please consult Amion 04/18/2023, 11:21 AM

## 2023-04-18 NOTE — Progress Notes (Signed)
[facility] Progress Note  Patient: Theodore [name]     RUE:[id number]  DOA: 04/10/2023   PCP: Gareth Morgan, MD       Brief hospital course: 67 year old male with COPD and new diagnosis of lung cancer who presented to the [facility] ED for worsening shortness of breath. On 4/25, he had a PET scan done which showed a right-sided hydropneumothorax.  He was transferred to [facility] and received a pigtail pleural catheter on 4/30 upon admission.  Unfortunately, he had a vasovagal response during the procedure and heart rate dropped to the 40s with blood pressures dropping to 60s/30s.  He was given a milligram of atropine.   5/2 - 5/7, patient has been on right-sided chest tube and stable.  Pleural fluid cytology resulted on 5/6, adenocarcinoma.  As per communication with primary oncologist, Dr. Ellin Saba, recommended biopsy for further studies.  S/p fiberoptic bronchoscopy with biopsies on 5/7.  DC home pending PCCM clearance, still has chest tube.  Subjective:  Seen this morning prior to procedures.  Aware of cancer diagnosis and plan for bronchoscopy but was dissatisfied due to lack of communication and nobody had told him to be n.p.o. overnight.  Fortunately had not eaten since last night except a cup of coffee this morning.  No other complaints reported.  Assessment and Plan: Principal Problem:   Hydropneumothorax 4/30: Right-sided ultrasound guided pleural catheter insertion by PCCM.  Exudative pleural effusion (7.8K WBCs, predominantly eosinophils and monocytes.  LDH 535, glucose 46).  Cultures negative to date. Serial chest x-ray's were monitored for improvement and stability.  Pulmonology was managing this.  CT chest without contrast 5/7: Right PTX has resolved.  Decreased and small volume right pleural effusion.  Perihilar right lung mass with associated postobstructive atelectasis of the entire right middle lobe.  Concern for lymphangitic spread.  Stable lytic  lesions to skeleton.  Pleural fluid cytology from 4/30: Adenocarcinoma.  Active Problems:   COPD (chronic obstructive pulmonary disease) (HCC) No clinical bronchospasm.  Continue Dulera (substituting for home Symbicort) and as needed bronchodilator nebs.  Hypotension: Noted early morning hours on 5/1 with SBP in the 70s.  Improved after 250 mL IV fluid bolus.  Unclear etiology.  Discontinued Benadryl and all opioids.  Patient reports history of hypotension to any "strong medications".  No recurrence.  Stage IV lung cancer/adenocarcinoma -PET scan shows of right middle lobe primary bronchogenic carcinoma with mediastinal, osseous and probable upper abdominal nodal metastasis, right-sided pneumothorax with persistent moderate right pleural effusion, metastasis within the right acetabulum, proximal right femur and possibly bilateral adrenal metastasis-also noted are a right renal mass and liver lesions which are not hypermetabolic -MRI brain on 4/18 revealed numerous brain mets with a dominant lesions being in the left frontal lobe - PET scan shows cancer like lesion in the right acetabulum and right femur as well - He had an appointment with the oncologist on 4/30 which he missed due to being in the hospital -CT chest without contrast findings as noted above. - Pleural fluid cytology 4/30: Adenocarcinoma -Case was discussed with primary oncologist Dr. Ellin Saba on 5/6 and he recommended biopsies for further studies.  S/p fiberoptic bronchoscopy with biopsies 5/7.  DC home when cleared by pulmonology and chest tube out.  Body mass index is 19.94 kg/m./Underweight -Twice daily Ensure  Insomnia Discontinue Benadryl which may also be a cause of his hypotension.  Started melatonin as needed.  History of sciatica   ACP documents: None present.   Code  Status: DNR Consultants: PCCM DVT prophylaxis:  heparin injection 5,000 Units Start: 04/10/23 2200 SCDs Start: 04/10/23 2127 Family  communication: Patient has repeatedly declined this MDs offer to speak with family to update care.  Today he states that he has 2 daughters and he has communicated with them regarding the cancer diagnosis and does not want to upset them more.  Again declined call to them.    Objective:   Vitals:   04/18/23 1235 04/18/23 1425 04/18/23 1440 04/18/23 1455  BP: (!) 126/90 113/86 120/87 125/80  Pulse: 89 (!) 105 (!) 106 98  Resp: 16 17 16    Temp: (!) 97.2 F (36.2 C) 97.8 F (36.6 C)  97.8 F (36.6 C)  TempSrc: Temporal     SpO2: 100% 99% 98%   Weight:      Height:       Filed Weights   04/10/23 1701  Weight: 61.2 kg    Exam: General exam: Middle-age male, moderately built and thinly nourished ambulated from the bathroom to history of steadily. Respiratory system: Right-sided chest tube connected to underwater seal.  Minimally diminished breath sounds in the right base but otherwise clear to auscultation.  No increased work of breathing.  Stable without change. Cardiovascular system: S1 & S2 heard, RRR. No JVD, murmurs, rubs, gallops or clicks. No pedal edema.  Off of telemetry. Gastrointestinal system: Abdomen is nondistended, soft and nontender. No organomegaly or masses felt. Normal bowel sounds heard. Central nervous system: Alert and oriented. No focal neurological deficits. Extremities: Symmetric 5 x 5 power. Skin: No rashes, lesions or ulcers Psychiatry: Judgement and insight appear normal. Mood & affect appropriate.     CBC: No results for input(s): "WBC", "NEUTROABS", "HGB", "HCT", "MCV", "PLT" in the last 168 hours.  Basic Metabolic Panel: No results for input(s): "NA", "K", "CL", "CO2", "GLUCOSE", "BUN", "CREATININE", "CALCIUM", "MG", "PHOS" in the last 168 hours.  GFR: Estimated Creatinine Clearance: 77.6 mL/min (by C-G formula based on SCr of 0.79 mg/dL).  Scheduled Meds:  feeding supplement  237 mL Oral BID BM   heparin  5,000 Units Subcutaneous Q8H    mometasone-formoterol  2 puff Inhalation BID   multivitamin with minerals  1 tablet Oral Daily   Continuous Infusions:   Imaging and lab data was personally reviewed DG Chest Port 1 View  Result Date: 04/18/2023 CLINICAL DATA:  Status post bronchoscopy. EXAM: PORTABLE CHEST 1 VIEW COMPARISON:  Same day. FINDINGS: The heart size and mediastinal contours are within normal limits. Stable appearance of partial atelectasis of right middle lobe. Stable mild scarring or subsegmental atelectasis is noted in inferior portion of right upper lobe. No pneumothorax is noted. Right basilar chest tube is unchanged in position. The visualized skeletal structures are unremarkable. IMPRESSION: No pneumothorax status post bronchoscopy. Stable partial atelectasis of right middle lobe as well as mild scarring or subsegmental atelectasis in inferior portion of right upper lobe. Electronically Signed   By: Lupita Raider M.D.   On: 04/18/2023 14:57   DG C-ARM BRONCHOSCOPY  Result Date: 04/18/2023 C-ARM BRONCHOSCOPY: Fluoroscopy was utilized by the requesting physician.  No radiographic interpretation.   DG CHEST PORT 1 VIEW  Result Date: 04/18/2023 CLINICAL DATA:  Pleural effusion. EXAM: PORTABLE CHEST 1 VIEW COMPARISON:  Radiograph yesterday. Blunt chest CT available at time of radiograph interpretation. FINDINGS: Right pigtail catheter is coiled at the right lung base. Small right pleural effusion. No definite pneumothorax. Ill-defined opacities in the right mid lower lung zone, subsequently assessed on chest  CT. The heart is normal in size with stable mediastinal contours. Background hyperinflation. IMPRESSION: 1. Right pigtail catheter coiled at the right lung base. Small right pleural effusion. No definite pneumothorax. 2. Ill-defined opacities in the right mid lower lung zone, subsequently assessed on chest CT. Electronically Signed   By: Narda Rutherford M.D.   On: 04/18/2023 12:06   CT Super D Chest Wo  Contrast  Result Date: 04/18/2023 CLINICAL DATA:  Progressive shortness of breath. New diagnosed of lung cancer. Recent pneumothorax. * Tracking Code: BO * EXAM: CT CHEST WITHOUT CONTRAST TECHNIQUE: Multidetector CT imaging of the chest was performed using thin slice collimation for electromagnetic bronchoscopy planning purposes, without intravenous contrast. RADIATION DOSE REDUCTION: This exam was performed according to the departmental dose-optimization program which includes automated exposure control, adjustment of the mA and/or kV according to patient size and/or use of iterative reconstruction technique. COMPARISON:  04/06/2023 FINDINGS: Cardiovascular: Normal heart size. Aortic atherosclerosis and coronary artery calcifications. No pericardial effusion. Mediastinum/Nodes: Thyroid gland, trachea, and esophagus are unremarkable. No enlarged mediastinal or axillary lymph nodes. The hilar lymph nodes are suboptimally evaluated due to lack of IV contrast. Lungs/Pleura: Percutaneous pigtail drainage catheter is identified overlying the right anterior lung base. The previous right pneumothorax has resolved in the interval. Small right pleural effusion has decreased in volume compared with the previous exam. Tracer avid lesion within the perihilar right lung is again seen measuring approximate 2.8 by 2.3 cm, image 115/3. Similar to previous exam. There is associated postobstructive atelectasis of the entire right middle lobe. Similar appearance of pleural nodularity along the right major and minor fissure concerning for lymphangitic spread of disease. Upper Abdomen: No acute abnormality. Unchanged, non FDG avid enhancing lesion and right lobe of liver measuring 1.1 cm. Favor benign hemangioma. Unchanged, exophytic right renal mass with peripheral calcification. No FDG uptake noted within this lesion on the previous PET-CT which is favored to represent a benign complex cyst. No follow-up imaging recommended.  Musculoskeletal: Lytic lesion involving the right scapula is again seen measuring 3.3 cm on today's study, image 11/3. Previously 3 cm. Lytic lesion within the head of the left clavicle measures 1 cm, image 26/3. Previously this measured the same. Unchanged lytic lesion involving the anterolateral aspect of the left third rib and fourth rib, image 47/3 and image 56/3. IMPRESSION: 1. Percutaneous pigtail drainage catheter is identified overlying the right anterior lung base. The previous right pneumothorax has resolved in the interval. 2. Small right pleural effusion has decreased in volume compared with the previous exam. 3. Similar appearance of perihilar right lung mass with associated postobstructive atelectasis of the entire right middle lobe. 4. Unchanged appearance of pleural nodularity along the right major and minor fissure concerning for lymphangitic spread of disease. 5. Stable appearance of lytic lesions involving the right scapula, head of left clavicle, and left third and fourth ribs. 6. Aortic Atherosclerosis (ICD10-I70.0) and Emphysema (ICD10-J43.9). Electronically Signed   By: Signa Kell M.D.   On: 04/18/2023 08:34   DG CHEST PORT 1 VIEW  Result Date: 04/17/2023 CLINICAL DATA:  Chest tube EXAM: PORTABLE CHEST 1 VIEW COMPARISON:  Portable exam 0533 hours compared to 04/15/2023 FINDINGS: Pigtail drainage catheter at LEFT lung base versus RIGHT upper quadrant. Normal heart size, mediastinal contours, and pulmonary vascularity. Atherosclerotic calcification aorta. Increased opacity at medial RIGHT lung base could represent atelectasis, loculated fluid, no mass at this site by recent PET-CT. Lungs appear emphysematous with skin folds projecting over RIGHT upper lobe on 1.  Minimal subsegmental atelectasis RIGHT upper lobe. No additional infiltrate or effusion. Trace RIGHT apex pneumothorax. IMPRESSION: Trace RIGHT apex pneumothorax decreased from 04/15/2023. Electronically Signed   By: Ulyses Southward  M.D.   On: 04/17/2023 12:54    LOS: 7 days   Marcellus Scott, MD,  FACP, [facility], [facility], [facility], [facility]   [facility] [place]     To contact the attending provider between 7A-7P or the covering provider during after hours 7P-7A, please log into the web site [email or web address] and access using universal [place] password for that web site. If you do not have the password, please call the hospital operator.    He had not eaten since last night except a cup of coffee.

## 2023-04-19 ENCOUNTER — Inpatient Hospital Stay (HOSPITAL_COMMUNITY): Payer: Medicare Other

## 2023-04-19 DIAGNOSIS — J948 Other specified pleural conditions: Secondary | ICD-10-CM | POA: Diagnosis not present

## 2023-04-19 LAB — CBC WITH DIFFERENTIAL/PLATELET
Abs Immature Granulocytes: 0 10*3/uL (ref 0.00–0.07)
Basophils Absolute: 0.2 10*3/uL — ABNORMAL HIGH (ref 0.0–0.1)
Basophils Relative: 1 %
Eosinophils Absolute: 0.7 10*3/uL — ABNORMAL HIGH (ref 0.0–0.5)
Eosinophils Relative: 4 %
HCT: 43.1 % (ref 39.0–52.0)
Hemoglobin: 14.6 g/dL (ref 13.0–17.0)
Lymphocytes Relative: 31 %
Lymphs Abs: 5.1 10*3/uL — ABNORMAL HIGH (ref 0.7–4.0)
MCH: 27.9 pg (ref 26.0–34.0)
MCHC: 33.9 g/dL (ref 30.0–36.0)
MCV: 82.4 fL (ref 80.0–100.0)
Monocytes Absolute: 1.6 10*3/uL — ABNORMAL HIGH (ref 0.1–1.0)
Monocytes Relative: 10 %
Neutro Abs: 8.9 10*3/uL — ABNORMAL HIGH (ref 1.7–7.7)
Neutrophils Relative %: 54 %
Platelets: 379 10*3/uL (ref 150–400)
RBC: 5.23 MIL/uL (ref 4.22–5.81)
RDW: 15.1 % (ref 11.5–15.5)
WBC: 16.4 10*3/uL — ABNORMAL HIGH (ref 4.0–10.5)
nRBC: 0 % (ref 0.0–0.2)
nRBC: 0 /100 WBC

## 2023-04-19 LAB — BASIC METABOLIC PANEL
Anion gap: 13 (ref 5–15)
BUN: 14 mg/dL (ref 8–23)
CO2: 21 mmol/L — ABNORMAL LOW (ref 22–32)
Calcium: 9.1 mg/dL (ref 8.9–10.3)
Chloride: 96 mmol/L — ABNORMAL LOW (ref 98–111)
Creatinine, Ser: 0.88 mg/dL (ref 0.61–1.24)
GFR, Estimated: >60 mL/min (ref >60)
Glucose, Bld: 99 mg/dL (ref 70–99)
Potassium: 4 mmol/L (ref 3.5–5.1)
Sodium: 130 mmol/L — ABNORMAL LOW (ref 135–145)

## 2023-04-19 MED ORDER — ACETAMINOPHEN 325 MG PO TABS: 650.0000 mg | ORAL_TABLET | Freq: Four times a day (QID) | ORAL | Status: DC | PRN

## 2023-04-19 NOTE — Progress Notes (Signed)
Called to Removed CT per CCM order. Pigtail catherter to R side removed without issues. Pt tolerated well. VSS. Dressing placed and RN notified. CXR ordered for 1200 PTA. RN instructed to call with any questions or concerns.

## 2023-04-19 NOTE — Progress Notes (Signed)
Called [facility] to come pull chest tube per MD order. They are on  the way.

## 2023-04-19 NOTE — Progress Notes (Signed)
   NAME:  Theodore [name], MRN:  [id number], DOB:  [birth year], LOS: 8 ADMISSION DATE:  04/10/2023, CONSULTATION DATE: 04/11/2023 REFERRING MD: [facility], CHIEF COMPLAINT: Presumed lung cancer and right hydropneumothorax  History of Present Illness:  67 year old male who gets his pulmonary care at [facility] has known lung mass and right pleural effusion hydropneumothorax and went to be treated at [facility].  He presented to [facility] acutely with dyspnea on exertion was transferred to [facility] for further evaluation and possible chest tube placement of right hydropneumothorax and the possibility of performing fiberoptic bronchoscopy for tissue diagnosis of his lung mass.  Pertinent  Medical History   Past Medical History:  Diagnosis Date   COPD (chronic obstructive pulmonary disease) (HCC)      Significant Hospital Events: Including procedures, antibiotic start and stop dates in addition to other pertinent events   04/11/2023 right chest tube insertion 04/18/2023 plan for fiberoptic bronchoscopy   Interim History / Subjective:   Status post fiberoptic bronchoscopy with tissue diagnosis 04/18/2023 per Dr. Tonia Brooms Orders written to discontinue right chest tube 04/19/2019  Objective   Blood pressure (!) 140/87, pulse 92, temperature 97.8 F (36.6 C), temperature source Oral, resp. rate 16, height 5\' 9"  (1.753 m), weight 61.2 kg, SpO2 97 %.        Intake/Output Summary (Last 24 hours) at 04/19/2023 0818 Last data filed at 04/18/2023 1423 Gross per 24 hour  Intake 600 ml  Output 0 ml  Net 600 ml   Filed Weights   04/10/23 1701  Weight: 61.2 kg    Examination: Elderly male in no acute distress at rest No JVD or lymphadenopathy is appreciated Right chest tube site chest tube is clamped leakage around the chest tube Decreased breath sounds at bases Heart sounds are regular Extremities are warm without  Assessment & Plan:  Right  hydropneumothorax in the setting of suspected lung cancer he has been evaluated [facility] but presented to [facility] with shortness of breath transferred to [facility] for possible fiberoptic bronchoscopy and chest tube insertion Apr 19, 2023 will be right chest tube Plan for chest x-ray at 12 noon If no complications no further input from pulmonary critical care needed He will need to follow-up with an outpatient visit with Dr. Tonia Brooms for cytology results Dressing as needed to the right chest tube site  COPD -con't bronchodilators  Best Practice (right click and "Reselect all SmartList Selections" daily)   Per primary  Labs   CBC: No results for input(s): "WBC", "NEUTROABS", "HGB", "HCT", "MCV", "PLT" in the last 168 hours.   Basic Metabolic Panel: No results for input(s): "NA", "K", "CL", "CO2", "GLUCOSE", "BUN", "CREATININE", "CALCIUM", "MG", "PHOS" in the last 168 hours.  GFR: Estimated Creatinine Clearance: 77.6 mL/min (by C-G formula based on SCr of 0.79 mg/dL). No results for input(s): "PROCALCITON", "WBC", "LATICACIDVEN" in the last 168 hours.  [name] Theodore [name] ACNP Acute Care Nurse Practitioner [facility] Pul[facility]/Critical Care Please consult Amion 04/19/2023, 8:18 AM

## 2023-04-19 NOTE — Discharge Summary (Signed)
Physician Discharge Summary  Theodore [name]:[id number] DOB: July 19, 1956 DOA: 04/10/2023  PCP: Gareth Morgan, MD  Admit date: 04/10/2023 Discharge date: 04/19/2023  Time spent: 37 minutes  Recommendations for Outpatient Follow-up:  CC Dr. Ellin Saba with regards to follow-up needs in the outpatient setting for cancer and chemo initiation Needs chest x-ray in about 1 week to ensure clearance CBC showed leukocytosis felt likely secondary to cancer metabolism-follow-up in the outpatient setting with CBC Chem-12 in about 1 week  Discharge Diagnoses:  MAIN problem for hospitalization   Hydropneumothorax on admission Lung adenocarcinoma stage IV Spinal stenosis  Please see below for itemized issues addressed in HOps[facility]- refer to other progress notes for clarity if needed  Discharge Condition: Fair to guarded  Diet recommendation: Heart healthy  Filed Weights   04/10/23 1701  Weight: 61.2 kg    History of present illness:  67 year old white male with COPD new lung cancer diagnosis-oncologist Dr. Ellin Saba on 4/29 referred him to [name], ED 2/2 abnormal PET scan = hydropneumothorax + moderate R pleural effusion-discussed with Dr. Celine Mans as this is presumed malignant with lung entrapment 4/30 right pigtail catheter with waterseal-2/2 significant vasovagal response was given atropine  5/7 transbronchial biopsies right middle lobe  Hospital Course:  Hydropneumothorax status post right-sided US guided pleural catheter insertion which turned out to be exudative CT chest 5/7 showed resolution of the pneumothorax small volume right-sided pleural effusion and perihilar right lung mass with postobstructive atelectasis His pleural fluid cytology showed adenocarcinoma CC Dr. Ellin Saba who should follow-up within a week to week and a half and consider initiation of targeted chemo  Stage IV lung cancer/adenocarcinoma PET scan showed RML bronchogenic CA with metastases + right renal mass  and liver lesions although not hypermetabolic Prior physician discussed with Dr. Ellin Saba on 5 6 and fiberoptic biopsies with bronchoscopy was performed on 5/7 Patient has been cleared for discharge and will discharge home on follow-up  History of spinal stenosis sciatica Ibuprofen to take with food  Patient had insomnia and hypotension on 5/1 likely secondary to Benadryl and IV strong pain meds-this completely resolved at time of discharge   Discharge Exam: Vitals:   04/19/23 0844 04/19/23 0858  BP: (!) 121/55 124/77  Pulse: 95 94  Resp: 16 16  Temp: 98.3 F (36.8 C)   SpO2: 100% 100%    Subj on day of d/c   Awake coherent no distress looks comfortable not on oxygen patient is dressed and seems to be ready to go  General Exam on discharge  EOMI NCAT frail cachectic black male no distress CTAB no wheeze rales rhonchi Abdomen soft no rebound No lower extremity edema Power 5/5   Discharge Instructions   Discharge Instructions     Diet - low sodium heart healthy   Complete by: As directed    Discharge instructions   Complete by: As directed    Please ensure that you follow-up with Dr. Kirtland Bouchard of oncology at [facility] and have a discussion with him with regards to the next steps in terms of treatment If you have high fevers chills nausea vomiting please present back to the emergency room Continue your ibuprofen with only food do not take it by itself--ensure that you have your inhalers and that you use them regularly   Increase activity slowly   Complete by: As directed       Allergies as of 04/19/2023       Reactions   Maalox [calcium Carbonate Antacid]    Short  of breath        Medication List     STOP taking these medications    HYDROcodone-acetaminophen 7.5-325 MG tablet Commonly known as: NORCO   predniSONE 10 MG tablet Commonly known as: DELTASONE       TAKE these medications    acetaminophen 325 MG tablet Commonly known as: TYLENOL Take 2  tablets (650 mg total) by mouth every 6 (six) hours as needed for mild pain, moderate pain, headache or fever (or Fever >/= 101).   albuterol 108 (90 Base) MCG/ACT inhaler Commonly known as: VENTOLIN HFA Inhale 2 puffs into the lungs every 4 (four) hours as needed for wheezing or shortness of breath.   ibuprofen 200 MG tablet Commonly known as: ADVIL Take 400 mg by mouth every 6 (six) hours as needed for moderate pain.   Symbicort 160-4.5 MCG/ACT inhaler Generic drug: budesonide-formoterol Inhale 1 puff into the lungs 2 (two) times daily as needed.       Allergies  Allergen Reactions   Maalox [Calcium Carbonate Antacid]     Short of breath    Follow-up Information     Bevelyn Ngo, NP Follow up.   Specialty: Pulmonary Disease Contact information: [address] [place] [address] [phone]         [name], MD Follow up.   Specialty: Hematology Contact information: [address] [place] [address] [phone]                  The results of significant diagnostics from this hospitalization (including imaging, microbiology, ancillary and laboratory) are listed below for reference.    Significant Diagnostic Studies: DG CHEST PORT 1 VIEW  Result Date: 04/19/2023 CLINICAL DATA:  Hypoxemia EXAM: PORTABLE CHEST 1 VIEW COMPARISON:  04/18/2023 FINDINGS: The lungs are hyperinflated in keeping with changes of underlying COPD. The lungs are clear. Right basilar pigtail chest tube is in place laterally. Small right pleural effusion is present. No pneumothorax. Cardiac size within normal limits. Pulmonary vascularity is normal. No acute bone abnormality. IMPRESSION: 1. Right basilar pigtail chest tube in place. Small right pleural effusion. No pneumothorax. Electronically Signed   By: Helyn Numbers M.D.   On: 04/19/2023 02:46   DG Chest Port 1 View  Result Date: 04/18/2023 CLINICAL DATA:  Status post bronchoscopy. EXAM: PORTABLE CHEST 1  VIEW COMPARISON:  Same day. FINDINGS: The heart size and mediastinal contours are within normal limits. Stable appearance of partial atelectasis of right middle lobe. Stable mild scarring or subsegmental atelectasis is noted in inferior portion of right upper lobe. No pneumothorax is noted. Right basilar chest tube is unchanged in position. The visualized skeletal structures are unremarkable. IMPRESSION: No pneumothorax status post bronchoscopy. Stable partial atelectasis of right middle lobe as well as mild scarring or subsegmental atelectasis in inferior portion of right upper lobe. Electronically Signed   By: Lupita Raider M.D.   On: 04/18/2023 14:57   DG C-ARM BRONCHOSCOPY  Result Date: 04/18/2023 C-ARM BRONCHOSCOPY: Fluoroscopy was utilized by the requesting physician.  No radiographic interpretation.   DG CHEST PORT 1 VIEW  Result Date: 04/18/2023 CLINICAL DATA:  Pleural effusion. EXAM: PORTABLE CHEST 1 VIEW COMPARISON:  Radiograph yesterday. Blunt chest CT available at time of radiograph interpretation. FINDINGS: Right pigtail catheter is coiled at the right lung base. Small right pleural effusion. No definite pneumothorax. Ill-defined opacities in the right mid lower lung zone, subsequently assessed on chest CT. The heart is normal in size  with stable mediastinal contours. Background hyperinflation. IMPRESSION: 1. Right pigtail catheter coiled at the right lung base. Small right pleural effusion. No definite pneumothorax. 2. Ill-defined opacities in the right mid lower lung zone, subsequently assessed on chest CT. Electronically Signed   By: Narda Rutherford M.D.   On: 04/18/2023 12:06   CT Super D Chest Wo Contrast  Result Date: 04/18/2023 CLINICAL DATA:  Progressive shortness of breath. New diagnosed of lung cancer. Recent pneumothorax. * Tracking Code: BO * EXAM: CT CHEST WITHOUT CONTRAST TECHNIQUE: Multidetector CT imaging of the chest was performed using thin slice collimation for  electromagnetic bronchoscopy planning purposes, without intravenous contrast. RADIATION DOSE REDUCTION: This exam was performed according to the departmental dose-optimization program which includes automated exposure control, adjustment of the mA and/or kV according to patient size and/or use of iterative reconstruction technique. COMPARISON:  04/06/2023 FINDINGS: Cardiovascular: Normal heart size. Aortic atherosclerosis and coronary artery calcifications. No pericardial effusion. Mediastinum/Nodes: Thyroid gland, trachea, and esophagus are unremarkable. No enlarged mediastinal or axillary lymph nodes. The hilar lymph nodes are suboptimally evaluated due to lack of IV contrast. Lungs/Pleura: Percutaneous pigtail drainage catheter is identified overlying the right anterior lung base. The previous right pneumothorax has resolved in the interval. Small right pleural effusion has decreased in volume compared with the previous exam. Tracer avid lesion within the perihilar right lung is again seen measuring approximate 2.8 by 2.3 cm, image 115/3. Similar to previous exam. There is associated postobstructive atelectasis of the entire right middle lobe. Similar appearance of pleural nodularity along the right major and minor fissure concerning for lymphangitic spread of disease. Upper Abdomen: No acute abnormality. Unchanged, non FDG avid enhancing lesion and right lobe of liver measuring 1.1 cm. Favor benign hemangioma. Unchanged, exophytic right renal mass with peripheral calcification. No FDG uptake noted within this lesion on the previous PET-CT which is favored to represent a benign complex cyst. No follow-up imaging recommended. Musculoskeletal: Lytic lesion involving the right scapula is again seen measuring 3.3 cm on today's study, image 11/3. Previously 3 cm. Lytic lesion within the head of the left clavicle measures 1 cm, image 26/3. Previously this measured the same. Unchanged lytic lesion involving the  anterolateral aspect of the left third rib and fourth rib, image 47/3 and image 56/3. IMPRESSION: 1. Percutaneous pigtail drainage catheter is identified overlying the right anterior lung base. The previous right pneumothorax has resolved in the interval. 2. Small right pleural effusion has decreased in volume compared with the previous exam. 3. Similar appearance of perihilar right lung mass with associated postobstructive atelectasis of the entire right middle lobe. 4. Unchanged appearance of pleural nodularity along the right major and minor fissure concerning for lymphangitic spread of disease. 5. Stable appearance of lytic lesions involving the right scapula, head of left clavicle, and left third and fourth ribs. 6. Aortic Atherosclerosis (ICD10-I70.0) and Emphysema (ICD10-J43.9). Electronically Signed   By: Signa Kell M.D.   On: 04/18/2023 08:34   DG CHEST PORT 1 VIEW  Result Date: 04/17/2023 CLINICAL DATA:  Chest tube EXAM: PORTABLE CHEST 1 VIEW COMPARISON:  Portable exam 0533 hours compared to 04/15/2023 FINDINGS: Pigtail drainage catheter at LEFT lung base versus RIGHT upper quadrant. Normal heart size, mediastinal contours, and pulmonary vascularity. Atherosclerotic calcification aorta. Increased opacity at medial RIGHT lung base could represent atelectasis, loculated fluid, no mass at this site by recent PET-CT. Lungs appear emphysematous with skin folds projecting over RIGHT upper lobe on 1. Minimal subsegmental atelectasis RIGHT upper lobe. No  additional infiltrate or effusion. Trace RIGHT apex pneumothorax. IMPRESSION: Trace RIGHT apex pneumothorax decreased from 04/15/2023. Electronically Signed   By: Ulyses Southward M.D.   On: 04/17/2023 12:54   DG CHEST PORT 1 VIEW  Result Date: 04/15/2023 CLINICAL DATA:  Follow-up right pneumothorax. EXAM: PORTABLE CHEST 1 VIEW COMPARISON:  04/14/2023 FINDINGS: Small approximately 10% right apical pneumothorax shows no significant change. Pleural pigtail  catheter again seen along the right hemidiaphragm. No evidence of pulmonary infiltrate or pleural fluid. Heart size is normal. IMPRESSION: No significant change in small right apical pneumothorax. Electronically Signed   By: Danae Orleans M.D.   On: 04/15/2023 10:10   DG CHEST PORT 1 VIEW  Result Date: 04/14/2023 CLINICAL DATA:  Chest tube EXAM: PORTABLE CHEST 1 VIEW COMPARISON:  Portable exam 0611 hours compared to 04/13/2023 FINDINGS: Pigtail thoracostomy tube at inferior RIGHT hemithorax. Normal heart size, mediastinal contours, and pulmonary vascularity. Atherosclerotic calcification aorta. Emphysematous changes consistent with COPD. Small residual RIGHT apex pneumothorax. Remaining lungs clear. Skin folds project over LEFT upper lobe. Osseous structures unremarkable. Calcified mass RIGHT upper quadrant 3.1 cm diameter, known renal lesion by prior PET-CT. IMPRESSION: COPD changes with small residual RIGHT apex pneumothorax. Aortic Atherosclerosis (ICD10-I70.0) and Emphysema (ICD10-J43.9). Electronically Signed   By: Ulyses Southward M.D.   On: 04/14/2023 08:40   DG CHEST PORT 1 VIEW  Result Date: 04/13/2023 CLINICAL DATA:  Chest tube. EXAM: PORTABLE CHEST 1 VIEW COMPARISON:  Apr 12, 2023. FINDINGS: The heart size and mediastinal contours are within normal limits. Stable right basilar chest tube is noted. Small right apical pneumothorax is noted and unchanged. No significant pulmonary consolidative process is noted. The visualized skeletal structures are unremarkable. IMPRESSION: Stable right-sided chest tube with stable small right apical pneumothorax. Electronically Signed   By: Lupita Raider M.D.   On: 04/13/2023 08:11   DG CHEST PORT 1 VIEW  Result Date: 04/12/2023 CLINICAL DATA:  29528 Hydropneumothorax 41324 EXAM: PORTABLE CHEST 1 VIEW COMPARISON:  04/11/2023 FINDINGS: Right basilar chest tube remains in place. Similar appearance with potential tube kinking at the skin entry site. Persistent right  hydropneumothorax. Air component estimated at 10-15%. Moderate-large layering fluid component. Persistent but slightly improving right basilar airspace opacity. Normal heart size. No abnormal shift of the heart or mediastinum. Left lung is clear. No left-sided pleural effusion or pneumothorax. IMPRESSION: 1. Persistent right hydropneumothorax with right basilar chest tube in place. Similar appearance with potential tube kinking at the skin entry site. 2. Persistent but slightly improving right basilar airspace opacity. Electronically Signed   By: Duanne Guess D.O.   On: 04/12/2023 08:18   DG CHEST PORT 1 VIEW  Result Date: 04/11/2023 CLINICAL DATA:  Left pneumothorax and pleural effusion, chest tube placement EXAM: PORTABLE CHEST 1 VIEW COMPARISON:  04/11/2023 at 8:03 a.m. FINDINGS: Interval placement of a pigtail pleural drainage catheter along the right lung base. This might possibly be partially kinked at the cutaneous margin where it seems to make an abrupt turn, although this may be projectional. Persistent 10% right pneumothorax. Persistent large right pleural fluid collection. Aside for mild basilar scarring the left lung remains clear. Atherosclerotic calcification of the aortic arch. Heart size within normal limits. IMPRESSION: 1. Interval placement of a pigtail pleural drainage catheter along the right lung base. Cannot exclude kinking at the skin surface, visual inspection suggested. Persistent 10% right pneumothorax and persistent large right pleural fluid collection. Electronically Signed   By: Gaylyn Rong M.D.   On: 04/11/2023 14:38  DG CHEST PORT 1 VIEW  Result Date: 04/11/2023 CLINICAL DATA:  Hydropneumothorax EXAM: PORTABLE CHEST 1 VIEW COMPARISON:  04/10/2023, 04/06/2023 FINDINGS: Single frontal view of the chest demonstrates a persistent right-sided hydropneumothorax, with no significant change in the gas component since prior exam. Slight increase in fluid component and right  basilar consolidation since prior exam. Left chest is clear. Cardiac silhouette is stable. No acute fracture. IMPRESSION: 1. Persistent right-sided hydropneumothorax. Stable gas component, with slight increase in fluid component and right basilar consolidation since prior exam. Please refer to recent PET-CT describing underlying right middle lobe bronchogenic carcinoma, as well as nodal and bony metastases. Electronically Signed   By: Sharlet Salina M.D.   On: 04/11/2023 08:30   DG Chest 1 View  Result Date: 04/10/2023 CLINICAL DATA:  Shortness of breath for 1 month.  Mild cough EXAM: CHEST  1 VIEW COMPARISON:  PET-CT 04/06/2023.  Older CT scan and x-rays as well FINDINGS: Right-sided hydropneumothorax. Moderate pneumothorax component similar to the prior CT scan. Adjacent right lung base opacity. Left lung is hyperinflated. Normal cardiopericardial silhouette. No edema. IMPRESSION: Known right-sided hydropneumothorax. Hyperinflation. Please correlate with prior PET-CT scan of 04/06/2019 Electronically Signed   By: Karen Kays M.D.   On: 04/10/2023 17:28   NM PET Image Initial (PI) Skull Base To Thigh  Result Date: 04/10/2023 CLINICAL DATA:  Initial treatment strategy for non-small-cell lung cancer. Initial staging. EXAM: NUCLEAR MEDICINE PET SKULL BASE TO THIGH TECHNIQUE: 6.5 mCi F-18 FDG was injected intravenously. Full-ring PET imaging was performed from the skull base to thigh after the radiotracer. CT data was obtained and used for attenuation correction and anatomic localization. Fasting blood glucose: 111 mg/dl COMPARISON:  14/78/2956 FINDINGS: Mediastinal blood pool activity: SUV max 2.5 Liver activity: SUV max NA NECK: No areas of abnormal hypermetabolism. Incidental CT findings: No cervical adenopathy. Right maxillary sinus mucosal thickening. Ethmoid air cell and right frontal sinus mucosal thickening. CHEST: The right middle lobe lung lesion detailed on chest CT is hypermetabolic. Example nodule  of 2.8 x 2.6 cm and a S.U.V. max of 9.1 on 128/3. Ipsilateral nodal metastasis including a low right mediastinal node of 8 mm and a S.U.V. max of 5.3 on 113/3. Incidental CT findings: Persistent moderate right pleural effusion. Development of a small right-sided pneumothorax, estimated at 15-20%. Aortic and coronary artery calcification. ABDOMEN/PELVIS: Bilateral, left-greater-than-right adrenal hypermetabolism with mild thickening and nodularity bilaterally. Example on the left at a S.U.V. max of 4.4. No hepatic hypermetabolism to correspond to the hyperenhancing liver lesions on CT. Isolated gastrohepatic ligament node measures 5 mm and a S.U.V. max of 4.2 on 158/3. Incidental CT findings: Non tracer avid 3.1 cm peripherally calcified interpolar right renal mass. Abdominal aortic atherosclerosis. SKELETON: Multiple hypermetabolic, primarily lytic osseous lesions. Example within the posterior right femoral shaft proximally at a S.U.V. max of 5.7 on 252/2. Correlate cortical disruption. Involving the right acetabulum and corresponding ill-defined lucency including at a S.U.V. max of 9.4 on 224/3. Index right scapular lesion with cortical destruction involving the acromion process including at a S.U.V. max of 5.7 on 56/3. Incidental CT findings: Third and fourth anterolateral left rib cortical destruction. IMPRESSION: 1. Right middle lobe primary bronchogenic carcinoma with mediastinal nodal, osseous, and probable upper abdominal nodal metastasis. 2. Development of a right-sided pneumothorax since 03/16/2022 with persistent moderate right pleural effusion. These results will be called to the ordering clinician or representative by the Radiologist Assistant, and communication documented in the PACS or [employer]. 3. Metastasis within  the right acetabulum and proximal right femur may predispose the patient to fracture with weight-bearing. Consider orthopedic consultation. 4. Bilateral adrenal hypermetabolism with  mild nodularity. Suspicious for early adrenal metastasis. 5. The right renal mass is not hypermetabolic, favored to represent a complex cyst. The liver lesions are not hypermetabolic and may represent benign entities such as hemangiomas. Recommend attention on follow-up. 6. Incidental findings, including: Sinus disease. Coronary artery atherosclerosis. Aortic Atherosclerosis (ICD10-I70.0). Electronically Signed   By: Jeronimo Greaves M.D.   On: 04/10/2023 15:37   MR Brain W Wo Contrast  Result Date: 03/30/2023 CLINICAL DATA:  Non-small cell lung cancer (NSCLC), staging EXAM: MRI HEAD WITHOUT AND WITH CONTRAST MRA HEAD WITHOUT CONTRAST TECHNIQUE: Multiplanar, multi-echo pulse sequences of the brain and surrounding structures were acquired without and with intravenous contrast. Angiographic images of the Circle of Willis were acquired using MRA technique without intravenous contrast. CONTRAST:  7mL GADAVIST GADOBUTROL 1 MMOL/ML IV SOLN COMPARISON:  None Available. FINDINGS: MRI HEAD FINDINGS Brain: Findings compatible with metastatic disease with multiple peripherally enhancing lesions. This includes a peripherally enhancing 1.5 x 1.7 cm lesion in the left frontal lobe (series 23, image 105). Complex peripherally enhancing 1.5 cm lesion more superiorly along the high left frontal lobe near the vertex (image 124). Approximately 5 mm peripherally enhancing lesion in the right frontal lobe (image 116). Probable peripherally enhancing 5 mm lesion in the right occipital lobe (image 51) and in the posterior left temporal lobe (image 53). Approximately 8 mm enhancing lesion in the left parietal lobe (series 23, image 108). Possible punctate lesion in the right frontal operculum (image 83) and parasagittal right frontal lobe (image 117). Ill-defined enhancing lesion in the right temporal lobe (image 71). Possible punctate enhancing lesion in the superior cerebellum (image 53). No midline shift, acute hemorrhage, acute infarct  or hydrocephalus. Vascular: Major arterial flow voids are maintained at the skull base. Skull and upper cervical spine: Normal marrow signal. Sinuses/Orbits: Paranasal sinus mucosal thickening. No acute orbital findings. Other: No mastoid effusions. MRA HEAD FINDINGS Anterior circulation: Bilateral intracranial ICAs, MCAs, and ACAs are patent without proximal high-grade stenosis. Posterior circulation: Bilateral intradural vertebral arteries, basilar artery and bilateral posterior cerebral arteries are patent without proximal hemodynamically significant stenosis. IMPRESSION: MRI: Many enhancing lesions in the brain, detailed above and compatible with metastatic disease. The dominant/largest lesions are in the left frontal lobe. MRA: No large vessel occlusion or proximal high-grade stenosis. Electronically Signed   By: Feliberto Harts M.D.   On: 03/30/2023 10:58   MR ANGIO HEAD WO CONTRAST  Result Date: 03/30/2023 CLINICAL DATA:  Non-small cell lung cancer (NSCLC), staging EXAM: MRI HEAD WITHOUT AND WITH CONTRAST MRA HEAD WITHOUT CONTRAST TECHNIQUE: Multiplanar, multi-echo pulse sequences of the brain and surrounding structures were acquired without and with intravenous contrast. Angiographic images of the Circle of Willis were acquired using MRA technique without intravenous contrast. CONTRAST:  7mL GADAVIST GADOBUTROL 1 MMOL/ML IV SOLN COMPARISON:  None Available. FINDINGS: MRI HEAD FINDINGS Brain: Findings compatible with metastatic disease with multiple peripherally enhancing lesions. This includes a peripherally enhancing 1.5 x 1.7 cm lesion in the left frontal lobe (series 23, image 105). Complex peripherally enhancing 1.5 cm lesion more superiorly along the high left frontal lobe near the vertex (image 124). Approximately 5 mm peripherally enhancing lesion in the right frontal lobe (image 116). Probable peripherally enhancing 5 mm lesion in the right occipital lobe (image 51) and in the posterior left  temporal lobe (image 53). Approximately 8 mm  enhancing lesion in the left parietal lobe (series 23, image 108). Possible punctate lesion in the right frontal operculum (image 83) and parasagittal right frontal lobe (image 117). Ill-defined enhancing lesion in the right temporal lobe (image 71). Possible punctate enhancing lesion in the superior cerebellum (image 53). No midline shift, acute hemorrhage, acute infarct or hydrocephalus. Vascular: Major arterial flow voids are maintained at the skull base. Skull and upper cervical spine: Normal marrow signal. Sinuses/Orbits: Paranasal sinus mucosal thickening. No acute orbital findings. Other: No mastoid effusions. MRA HEAD FINDINGS Anterior circulation: Bilateral intracranial ICAs, MCAs, and ACAs are patent without proximal high-grade stenosis. Posterior circulation: Bilateral intradural vertebral arteries, basilar artery and bilateral posterior cerebral arteries are patent without proximal hemodynamically significant stenosis. IMPRESSION: MRI: Many enhancing lesions in the brain, detailed above and compatible with metastatic disease. The dominant/largest lesions are in the left frontal lobe. MRA: No large vessel occlusion or proximal high-grade stenosis. Electronically Signed   By: Feliberto Harts M.D.   On: 03/30/2023 10:58    Microbiology: Recent Results (from the past 240 hour(s))  Body fluid culture w Gram Stain     Status: None   Collection Time: 04/11/23  1:59 PM   Specimen: Pleural Fluid  Result Value Ref Range Status   Specimen Description PLEURAL  Final   Special Requests NONE  Final   Gram Stain   Final    FEW WBC PRESENT, PREDOMINANTLY MONONUCLEAR NO ORGANISMS SEEN    Culture   Final    NO GROWTH 3 DAYS Performed at [facility], [address], [place], [place] [address]    Report Status 04/14/2023 FINAL  Final  SARS Coronavirus 2 by RT PCR (hospital order, performed in [facility] hospital lab) *cepheid single result test*  Anterior Nasal Swab     Status: None   Collection Time: 04/18/23  7:35 AM   Specimen: Anterior Nasal Swab  Result Value Ref Range Status   SARS Coronavirus 2 by RT PCR NEGATIVE NEGATIVE Final    Comment: Performed at [facility], [address] [place], [place] [address]     Labs: Basic Metabolic Panel: Recent Labs  Lab 04/19/23 0806  NA 130*  K 4.0  CL 96*  CO2 21*  GLUCOSE 99  BUN 14  CREATININE 0.88  CALCIUM 9.1   Liver Function Tests: No results for input(s): "AST", "ALT", "ALKPHOS", "BILITOT", "PROT", "ALBUMIN" in the last 168 hours. No results for input(s): "LIPASE", "AMYLASE" in the last 168 hours. No results for input(s): "AMMONIA" in the last 168 hours. CBC: Recent Labs  Lab 04/19/23 0812  WBC 16.4*  NEUTROABS 8.9*  HGB 14.6  HCT 43.1  MCV 82.4  PLT 379   Cardiac Enzymes: No results for input(s): "CKTOTAL", "CKMB", "CKMBINDEX", "TROPONINI" in the last 168 hours. BNP: BNP (last 3 results) No results for input(s): "BNP" in the last 8760 hours.  ProBNP (last 3 results) No results for input(s): "PROBNP" in the last 8760 hours.  CBG: No results for input(s): "GLUCAP" in the last 168 hours.     Signed:  Rhetta Mura MD   [facility] 04/19/2023, 12:42 PM

## 2023-04-19 NOTE — Progress Notes (Signed)
Discharge instructions given to pt. Pt verbalized understanding of all teaching and had no further questions. Patient currently waiting in room for ride to pick him up

## 2023-04-19 NOTE — Progress Notes (Signed)
eLink Physician-Brief Progress Note Patient Name: Theodore [name] DOB: May 04, 1956 MRN: [id number]   Date of Service  04/19/2023  HPI/Events of Note  67 year old male with a history of right middle lobe lung nodule who underwent bronchoscopy with robotic assisted biopsies and unfortunately had a right-sided pneumothorax with chest tube in place.  Some leaking was noted around the chest tube any liquids notified.    eICU Interventions  Discussed case with bedside nurse, some leaking is really normal.  Chest tube is clamped and anticipate removal in the morning.  Radiograph looks appropriate without significant pneumothorax.  Continue current management, no intervention indicated     Intervention Category Minor Interventions: Routine modifications to care plan (e.g. PRN medications for pain, fever)  Theodore [name] 04/19/2023, 12:53 AM

## 2023-04-19 NOTE — Anesthesia Postprocedure Evaluation (Signed)
Anesthesia Post Note  Patient: Theodore [name]  Procedure(s) Performed: ROBOTIC ASSISTED NAVIGATIONAL BRONCHOSCOPY (Bilateral)     Patient location during evaluation: PACU Anesthesia Type: General Level of consciousness: awake and alert Pain management: pain level controlled Vital Signs Assessment: post-procedure vital signs reviewed and stable Respiratory status: spontaneous breathing, nonlabored ventilation and respiratory function stable Cardiovascular status: blood pressure returned to baseline and stable Postop Assessment: no apparent nausea or vomiting Anesthetic complications: no   No notable events documented.  Last Vitals:  Vitals:   04/18/23 2106 04/19/23 0509  BP: 115/72 (!) 140/87  Pulse: (!) 108 92  Resp: 16   Temp: 36.9 C 36.6 C  SpO2: 98% 97%    Last Pain:  Vitals:   04/19/23 0509  TempSrc: Oral  PainSc:                  Lowella Curb

## 2023-04-19 NOTE — Care Management Important Message (Signed)
Important Message  Patient Details  Name: Theodore [name] MRN: [id number] Date of Birth: 09-Feb-1956   Medicare Important Message Given:  Yes     Sherilyn Banker 04/19/2023, 11:51 AM

## 2023-04-21 LAB — CYTOLOGY - NON PAP

## 2023-04-21 NOTE — Progress Notes (Signed)
Pathology results were discussed with the patient consistent with non-small cell carcinoma, adenocarcinoma during his hospitalization.  He has an appointment to see Dr. Kirtland Bouchard on 04/24/2023.  Thanks,  BLI  Josephine Igo, DO [facility] 04/21/2023 5:34 PM

## 2023-04-24 ENCOUNTER — Inpatient Hospital Stay: Payer: Medicare Other

## 2023-04-24 ENCOUNTER — Inpatient Hospital Stay: Payer: Medicare Other | Admitting: Dietician

## 2023-04-24 ENCOUNTER — Inpatient Hospital Stay: Payer: Medicare Other | Attending: Hematology | Admitting: Hematology

## 2023-04-24 ENCOUNTER — Encounter (HOSPITAL_COMMUNITY): Payer: Self-pay | Admitting: Pulmonary Disease

## 2023-04-24 VITALS — BP 118/88 | HR 110 | Temp 97.3°F | Resp 19 | Ht 69.0 in | Wt 124.0 lb

## 2023-04-24 DIAGNOSIS — Z87891 Personal history of nicotine dependence: Secondary | ICD-10-CM

## 2023-04-24 DIAGNOSIS — Z800 Family history of malignant neoplasm of digestive organs: Secondary | ICD-10-CM | POA: Insufficient documentation

## 2023-04-24 DIAGNOSIS — G893 Neoplasm related pain (acute) (chronic): Secondary | ICD-10-CM | POA: Diagnosis not present

## 2023-04-24 DIAGNOSIS — C342 Malignant neoplasm of middle lobe, bronchus or lung: Secondary | ICD-10-CM | POA: Diagnosis present

## 2023-04-24 DIAGNOSIS — C7970 Secondary malignant neoplasm of unspecified adrenal gland: Secondary | ICD-10-CM | POA: Diagnosis not present

## 2023-04-24 DIAGNOSIS — C7951 Secondary malignant neoplasm of bone: Secondary | ICD-10-CM | POA: Diagnosis not present

## 2023-04-24 DIAGNOSIS — C3490 Malignant neoplasm of unspecified part of unspecified bronchus or lung: Secondary | ICD-10-CM

## 2023-04-24 DIAGNOSIS — Z575 Occupational exposure to toxic agents in other industries: Secondary | ICD-10-CM | POA: Diagnosis not present

## 2023-04-24 DIAGNOSIS — Z791 Long term (current) use of non-steroidal anti-inflammatories (NSAID): Secondary | ICD-10-CM | POA: Diagnosis not present

## 2023-04-24 DIAGNOSIS — C7931 Secondary malignant neoplasm of brain: Secondary | ICD-10-CM | POA: Insufficient documentation

## 2023-04-24 MED ORDER — FOLIC ACID 1 MG PO TABS: 1.0000 mg | ORAL_TABLET | Freq: Every day | ORAL | 1 refills | Status: DC

## 2023-04-24 NOTE — Progress Notes (Signed)
[facility] [address], [place] [address]   Clinic Day:  04/24/2023  Referring physician: Gareth Morgan, MD  Patient Care Team: Gareth Morgan, MD as PCP - General (Family Medicine)   ASSESSMENT & PLAN:   Assessment:  1.  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 Sudie Bailey with a chest x-ray on 03/15/2023, with right pleural effusion. - CT chest (03/17/2023): Right middle lobe mass with complete collapse of the right middle lobe, moderate right pleural effusion, nodularity of the right minor fissure, lytic lesions of the right scapula and left third and fourth ribs.  Multiple enhancing liver lesions concerning for metastatic disease. - PET scan (04/06/2023): Right middle lobe primary bronchogenic carcinoma with mediastinal nodal, bone metastasis and probable upper abdominal nodal metastasis.  Right sided pneumothorax.  Metastasis with right acetabulum and proximal right femur.  Bilateral adrenal hypermetabolism with mild nodularity suspicious for early metastasis.  Right renal mass is not hypermetabolic.  No hepatic hypermetabolism. - Brain MRI (03/30/2023): 1.5 x 1.7 cm lesion in the left frontal lobe.  1.5 cm lesion in the high left frontal lobe.  5 mm lesion in the right frontal lobe.  Few other subcentimeter lesions, total of 10 reported. - Right pleural fluid cytology (04/11/2023): Malignant cells consistent with adenocarcinoma. - RML lung FNA (04/18/2023): Lung adenocarcinoma. - Guardant360: EGFR G719C, EGFR S768I.  MSI-high not detected.  2.  Social/family history: - He lives by himself.  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  He had exposure to chemicals.  He quit smoking in 2012.  Smoked 1 pack/day starting age 57. - Maternal grandmother and  maternal uncle had colon cancers.  Plan:  1.  Metastatic adenocarcinoma of the lung to the brain, bones and adrenals: - We have reviewed recent hospitalization records. - We are awaiting tissue NGS results. - We discussed Guardant360 blood test results which showed uncommon EGFR mutations with possible response to afatinib. - I will also look into literature about effectiveness of osimertinib in these uncommon EGFR mutations as brain penetration of osimertinib is high.  2.  Brain metastasis: - He is completely asymptomatic at this time. - We will make a referral to radiation oncology for [facility].  3.  Right hip pain: - We discussed the findings on the PET scan showing right acetabular and proximal right femur lesions. - Recommend MRI of the pelvis to see if he needs any surgical intervention.  Otherwise I would recommend palliative radiation for pain control.  Orders Placed This Encounter  Procedures   MR PELVIS W WO CONTRAST    Standing Status:   Future    Standing Expiration Date:   04/23/2024    Order Specific Question:   If indicated for the ordered procedure, I authorize the administration of contrast media per Radiology protocol    Answer:   Yes    Order Specific Question:   What is the patient's sedation requirement?    Answer:   No Sedation    Order Specific Question:   Does the patient have a pacemaker or implanted devices?    Answer:   No    Order Specific Question:   Preferred imaging location?    Answer:   [facility] (table limit [id number])    Order Specific Question:  Release to patient    Answer:   Immediate   IR IMAGING GUIDED PORT INSERTION    Standing Status:   Future    Standing Expiration Date:   04/23/2024    Order Specific Question:   Reason for Exam (SYMPTOM  OR DIAGNOSIS REQUIRED)    Answer:   Stage IV NSCLC    Order Specific Question:   Preferred Imaging Location?    Answer:   [facility]    Order Specific Question:   Release to patient     Answer:   Immediate      I,Katie Daubenspeck,acting as a scribe for [name], MD.,have documented all relevant documentation on the behalf of [name], MD,as directed by  [name], MD while in the presence of [name], MD.   I, [name] MD, have reviewed the above documentation for accuracy and completeness, and I agree with the above.   [name], MD   5/13/20244:57 PM  CHIEF COMPLAINT/PURPOSE OF CONSULT:   Diagnosis: metastatic non-small cell adenocarcinoma   Cancer Staging  No matching staging information was found for the patient.   Prior Therapy: none  Current Therapy: Brain radiation, EGFR TKI   HISTORY OF PRESENT ILLNESS:   Oncology History   No history exists.      Heathe is a 67 y.o. male presenting to clinic today for evaluation of metastatic non-small cell adenocarcinoma at the request of Dr. Sudie Bailey.  He presented to his PCP with chest pain. He underwent chest x-ray on 03/15/23 showing: emphysematous changes with RML atelectasis vs consolidation; associated right pleural effusion and basilar atelectasis, underlying abnormalities at right lung base not excluded. Further evaluation with chest CT on 03/17/23 showed: 2.8 cm RML mass with associated complete collapse of RML; moderate right pleural effusion; nodularity of right minor fissure; lytic lesions of right scapula and left 3rd and 4th ribs; multiple enhancing liver lesions.  He underwent staging brain MRI/head MRA on 03/30/23 showing: many enhancing lesions in brain, with dominant/largest lesions in left frontal lobe. He also underwent staging PET scan on 04/06/23 showing: RML primary bronchogenic carcinoma with mediastinal nodal, osseous, and probable upper abdominal nodal metastasis; development of right-sided pneumothorax since 03/17/23 CT with persistent moderate right pleural effusion; metastasis within right acetabulum and proximal right femur, may  predispose patient to fracture; bilateral adrenal hypermetabolism with mild nodularity; liver lesions are not hypermetabolic.  Given findings of pneumothorax, we recommended he go to the ED on 04/10/23. He was admitted and underwent chest tube insertion on 04/01/23. Pleural fluid was cent for cytology and confirmed malignant cells consistent with adenocarcinoma.  He also underwent bronchoscopy on 04/18/23 under Dr. Tonia Brooms while admitted. Cytology from the RML FNA also confirmed non-small cell adenocarcinoma.  Today, he states that he is doing well overall. His appetite level is at 90%. His energy level is at 75%.  PAST MEDICAL HISTORY:   Past Medical History: Past Medical History:  Diagnosis Date   COPD (chronic obstructive pulmonary disease) (HCC)     Surgical History: Past Surgical History:  Procedure Laterality Date   BRONCHIAL BIOPSY  04/18/2023   Procedure: BRONCHIAL BIOPSIES;  Surgeon: Josephine Igo, DO;  Location: [facility];  Service: Pulmonary;;   BRONCHIAL NEEDLE ASPIRATION BIOPSY  04/18/2023   Procedure: BRONCHIAL NEEDLE ASPIRATION BIOPSIES;  Surgeon: Josephine Igo, DO;  Location: [facility];  Service: Pulmonary;;   CHEST TUBE INSERTION Right 04/11/2023   Procedure: CHEST TUBE INSERTION;  Surgeon: Omar Person, MD;  Location: [facility];  Service: Pulmonary;  Laterality: Right;  fentanyl IV only   excision of cyst left ankle     MASS EXCISION Left 08/27/2018   Procedure: EXCISION LIPOMA LEFT BUTTOCK;  Surgeon: Franky Macho, MD;  Location: [facility];  Service: General;  Laterality: Left;    Social History: Social History   Socioeconomic History   Marital status: Married    Spouse name: Not on file   Number of children: Not on file   Years of education: Not on file   Highest education level: Not on file  Occupational History   Not on file  Tobacco Use   Smoking status: Former    Types: Cigarettes    Quit date: 2008    Years since quitting: 16.3   Smokeless  tobacco: Never  Vaping Use   Vaping Use: Never used  Substance and Sexual Activity   Alcohol use: Not Currently   Drug use: Not Currently   Sexual activity: Yes    Birth control/protection: None  Other Topics Concern   Not on file  Social History Narrative   Not on file   Social Determinants of Health   Financial Resource Strain: Not on file  Food Insecurity: Food Insecurity Present (04/10/2023)   Hunger Vital Sign    Worried About Running Out of Food in the Last Year: Often true    Ran Out of Food in the Last Year: Sometimes true  Transportation Needs: No Transportation Needs (04/10/2023)   PRAPARE - Administrator, Civil Service (Medical): No    Lack of Transportation (Non-Medical): No  Physical Activity: Not on file  Stress: Not on file  Social Connections: Not on file  Intimate Partner Violence: Not At Risk (04/10/2023)   Humiliation, Afraid, Rape, and Kick questionnaire    Fear of Current or Ex-Partner: No    Emotionally Abused: No    Physically Abused: No    Sexually Abused: No    Family History: No family history on file.  Current Medications:  Current Outpatient Medications:    acetaminophen (TYLENOL) 325 MG tablet, Take 2 tablets (650 mg total) by mouth every 6 (six) hours as needed for mild pain, moderate pain, headache or fever (or Fever >/= 101)., Disp: , Rfl:    albuterol (PROVENTIL HFA;VENTOLIN HFA) 108 (90 Base) MCG/ACT inhaler, Inhale 2 puffs into the lungs every 4 (four) hours as needed for wheezing or shortness of breath., Disp: , Rfl:    folic acid (FOLVITE) 1 MG tablet, Take 1 tablet (1 mg total) by mouth daily., Disp: 30 tablet, Rfl: 1   ibuprofen (ADVIL) 200 MG tablet, Take 400 mg by mouth every 6 (six) hours as needed for moderate pain., Disp: , Rfl:    SYMBICORT 160-4.5 MCG/ACT inhaler, Inhale 1 puff into the lungs 2 (two) times daily as needed., Disp: , Rfl: 11   Allergies: Allergies  Allergen Reactions   Maalox [Calcium Carbonate  Antacid]     Short of breath    REVIEW OF SYSTEMS:   Review of Systems  Constitutional:  Negative for chills, fatigue and fever.  HENT:   Negative for lump/mass, mouth sores, nosebleeds, sore throat and trouble swallowing.   Eyes:  Negative for eye problems.  Respiratory:  Positive for cough. Negative for shortness of breath.   Cardiovascular:  Negative for chest pain, leg swelling and palpitations.  Gastrointestinal:  Negative for abdominal pain, constipation, diarrhea, nausea and vomiting.  Genitourinary:  Negative for bladder incontinence, difficulty urinating,  dysuria, frequency, hematuria and nocturia.   Musculoskeletal:  Positive for arthralgias (Right hip). Negative for back pain, flank pain, myalgias and neck pain.  Skin:  Negative for itching and rash.  Neurological:  Negative for dizziness, headaches and numbness.  Hematological:  Does not bruise/bleed easily.  Psychiatric/Behavioral:  Negative for depression, sleep disturbance and suicidal ideas. The patient is not nervous/anxious.   All other systems reviewed and are negative.    VITALS:   Blood pressure 118/88, pulse (!) 110, temperature (!) 97.3 F (36.3 C), temperature source Tympanic, resp. rate 19, height 5\' 9"  (1.753 m), weight 124 lb (56.2 kg), SpO2 100 %.  Wt Readings from Last 3 Encounters:  04/24/23 124 lb (56.2 kg)  04/10/23 135 lb (61.2 kg)  03/30/23 135 lb (61.2 kg)    Body mass index is 18.31 kg/m.  Performance status (ECOG): 1 - Symptomatic but completely ambulatory  PHYSICAL EXAM:   Physical Exam Vitals and nursing note reviewed. Exam conducted with a chaperone present.  Constitutional:      Appearance: Normal appearance.  Cardiovascular:     Rate and Rhythm: Normal rate and regular rhythm.     Pulses: Normal pulses.     Heart sounds: Normal heart sounds.  Pulmonary:     Effort: Pulmonary effort is normal.     Breath sounds: Normal breath sounds.  Abdominal:     Palpations: Abdomen is  soft. There is no hepatomegaly, splenomegaly or mass.     Tenderness: There is no abdominal tenderness.  Musculoskeletal:     Right lower leg: No edema.     Left lower leg: No edema.  Lymphadenopathy:     Cervical: No cervical adenopathy.     Right cervical: No superficial, deep or posterior cervical adenopathy.    Left cervical: No superficial, deep or posterior cervical adenopathy.     Upper Body:     Right upper body: No supraclavicular or axillary adenopathy.     Left upper body: No supraclavicular or axillary adenopathy.  Neurological:     General: No focal deficit present.     Mental Status: He is alert and oriented to person, place, and time.  Psychiatric:        Mood and Affect: Mood normal.        Behavior: Behavior normal.     LABS:      Latest Ref Rng & Units 04/19/2023    8:12 AM 04/11/2023    4:10 AM 04/10/2023    5:42 PM  CBC  WBC 4.0 - 10.5 K/uL 16.4  12.6  14.1   Hemoglobin 13.0 - 17.0 g/dL 16.1  09.6  04.5   Hematocrit 39.0 - 52.0 % 43.1  42.2  47.2   Platelets 150 - 400 K/uL 379  339  358       Latest Ref Rng & Units 04/19/2023    8:06 AM 04/12/2023    4:38 AM 04/11/2023    4:10 AM  CMP  Glucose 70 - 99 mg/dL 99   96   BUN 8 - 23 mg/dL 14   15   Creatinine 4.09 - 1.24 mg/dL 8.11   9.14   Sodium 782 - 145 mmol/L 130   135   Potassium 3.5 - 5.1 mmol/L 4.0   3.7   Chloride 98 - 111 mmol/L 96   100   CO2 22 - 32 mmol/L 21   26   Calcium 8.9 - 10.3 mg/dL 9.1   8.9   Total Protein  6.5 - 8.1 g/dL  6.5  6.3   Total Bilirubin 0.3 - 1.2 mg/dL   0.6   Alkaline Phos 38 - 126 U/L   115   AST 15 - 41 U/L   17   ALT 0 - 44 U/L   23      No results found for: "CEA1", "CEA" / No results found for: "CEA1", "CEA" No results found for: "PSA1" No results found for: "ZOX096" No results found for: "CAN125"  No results found for: "TOTALPROTELP", "ALBUMINELP", "A1GS", "A2GS", "BETS", "BETA2SER", "GAMS", "MSPIKE", "SPEI" No results found for: "TIBC", "FERRITIN",  "IRONPCTSAT" Lab Results  Component Value Date   LDH 197 (H) 04/12/2023     STUDIES:   DG CHEST PORT 1 VIEW  Result Date: 04/19/2023 CLINICAL DATA:  Chest tube removal. EXAM: PORTABLE CHEST 1 VIEW COMPARISON:  Radiograph earlier today. CT yesterday FINDINGS: Right pigtail catheter has been removed. There is no pneumothorax. Small right pleural effusion persists. The exam is otherwise unchanged. IMPRESSION: Removal of right chest tube. No pneumothorax. Small right pleural effusion persists. Electronically Signed   By: Narda Rutherford M.D.   On: 04/19/2023 14:50   DG CHEST PORT 1 VIEW  Result Date: 04/19/2023 CLINICAL DATA:  Hypoxemia EXAM: PORTABLE CHEST 1 VIEW COMPARISON:  04/18/2023 FINDINGS: The lungs are hyperinflated in keeping with changes of underlying COPD. The lungs are clear. Right basilar pigtail chest tube is in place laterally. Small right pleural effusion is present. No pneumothorax. Cardiac size within normal limits. Pulmonary vascularity is normal. No acute bone abnormality. IMPRESSION: 1. Right basilar pigtail chest tube in place. Small right pleural effusion. No pneumothorax. Electronically Signed   By: Helyn Numbers M.D.   On: 04/19/2023 02:46   DG Chest Port 1 View  Result Date: 04/18/2023 CLINICAL DATA:  Status post bronchoscopy. EXAM: PORTABLE CHEST 1 VIEW COMPARISON:  Same day. FINDINGS: The heart size and mediastinal contours are within normal limits. Stable appearance of partial atelectasis of right middle lobe. Stable mild scarring or subsegmental atelectasis is noted in inferior portion of right upper lobe. No pneumothorax is noted. Right basilar chest tube is unchanged in position. The visualized skeletal structures are unremarkable. IMPRESSION: No pneumothorax status post bronchoscopy. Stable partial atelectasis of right middle lobe as well as mild scarring or subsegmental atelectasis in inferior portion of right upper lobe. Electronically Signed   By: Lupita Raider  M.D.   On: 04/18/2023 14:57   DG C-ARM BRONCHOSCOPY  Result Date: 04/18/2023 C-ARM BRONCHOSCOPY: Fluoroscopy was utilized by the requesting physician.  No radiographic interpretation.   DG CHEST PORT 1 VIEW  Result Date: 04/18/2023 CLINICAL DATA:  Pleural effusion. EXAM: PORTABLE CHEST 1 VIEW COMPARISON:  Radiograph yesterday. Blunt chest CT available at time of radiograph interpretation. FINDINGS: Right pigtail catheter is coiled at the right lung base. Small right pleural effusion. No definite pneumothorax. Ill-defined opacities in the right mid lower lung zone, subsequently assessed on chest CT. The heart is normal in size with stable mediastinal contours. Background hyperinflation. IMPRESSION: 1. Right pigtail catheter coiled at the right lung base. Small right pleural effusion. No definite pneumothorax. 2. Ill-defined opacities in the right mid lower lung zone, subsequently assessed on chest CT. Electronically Signed   By: Narda Rutherford M.D.   On: 04/18/2023 12:06   CT Super D Chest Wo Contrast  Result Date: 04/18/2023 CLINICAL DATA:  Progressive shortness of breath. New diagnosed of lung cancer. Recent pneumothorax. * Tracking Code: BO * EXAM: CT  CHEST WITHOUT CONTRAST TECHNIQUE: Multidetector CT imaging of the chest was performed using thin slice collimation for electromagnetic bronchoscopy planning purposes, without intravenous contrast. RADIATION DOSE REDUCTION: This exam was performed according to the departmental dose-optimization program which includes automated exposure control, adjustment of the mA and/or kV according to patient size and/or use of iterative reconstruction technique. COMPARISON:  04/06/2023 FINDINGS: Cardiovascular: Normal heart size. Aortic atherosclerosis and coronary artery calcifications. No pericardial effusion. Mediastinum/Nodes: Thyroid gland, trachea, and esophagus are unremarkable. No enlarged mediastinal or axillary lymph nodes. The hilar lymph nodes are  suboptimally evaluated due to lack of IV contrast. Lungs/Pleura: Percutaneous pigtail drainage catheter is identified overlying the right anterior lung base. The previous right pneumothorax has resolved in the interval. Small right pleural effusion has decreased in volume compared with the previous exam. Tracer avid lesion within the perihilar right lung is again seen measuring approximate 2.8 by 2.3 cm, image 115/3. Similar to previous exam. There is associated postobstructive atelectasis of the entire right middle lobe. Similar appearance of pleural nodularity along the right major and minor fissure concerning for lymphangitic spread of disease. Upper Abdomen: No acute abnormality. Unchanged, non FDG avid enhancing lesion and right lobe of liver measuring 1.1 cm. Favor benign hemangioma. Unchanged, exophytic right renal mass with peripheral calcification. No FDG uptake noted within this lesion on the previous PET-CT which is favored to represent a benign complex cyst. No follow-up imaging recommended. Musculoskeletal: Lytic lesion involving the right scapula is again seen measuring 3.3 cm on today's study, image 11/3. Previously 3 cm. Lytic lesion within the head of the left clavicle measures 1 cm, image 26/3. Previously this measured the same. Unchanged lytic lesion involving the anterolateral aspect of the left third rib and fourth rib, image 47/3 and image 56/3. IMPRESSION: 1. Percutaneous pigtail drainage catheter is identified overlying the right anterior lung base. The previous right pneumothorax has resolved in the interval. 2. Small right pleural effusion has decreased in volume compared with the previous exam. 3. Similar appearance of perihilar right lung mass with associated postobstructive atelectasis of the entire right middle lobe. 4. Unchanged appearance of pleural nodularity along the right major and minor fissure concerning for lymphangitic spread of disease. 5. Stable appearance of lytic lesions  involving the right scapula, head of left clavicle, and left third and fourth ribs. 6. Aortic Atherosclerosis (ICD10-I70.0) and Emphysema (ICD10-J43.9). Electronically Signed   By: Signa Kell M.D.   On: 04/18/2023 08:34   DG CHEST PORT 1 VIEW  Result Date: 04/17/2023 CLINICAL DATA:  Chest tube EXAM: PORTABLE CHEST 1 VIEW COMPARISON:  Portable exam 0533 hours compared to 04/15/2023 FINDINGS: Pigtail drainage catheter at LEFT lung base versus RIGHT upper quadrant. Normal heart size, mediastinal contours, and pulmonary vascularity. Atherosclerotic calcification aorta. Increased opacity at medial RIGHT lung base could represent atelectasis, loculated fluid, no mass at this site by recent PET-CT. Lungs appear emphysematous with skin folds projecting over RIGHT upper lobe on 1. Minimal subsegmental atelectasis RIGHT upper lobe. No additional infiltrate or effusion. Trace RIGHT apex pneumothorax. IMPRESSION: Trace RIGHT apex pneumothorax decreased from 04/15/2023. Electronically Signed   By: Ulyses Southward M.D.   On: 04/17/2023 12:54   DG CHEST PORT 1 VIEW  Result Date: 04/15/2023 CLINICAL DATA:  Follow-up right pneumothorax. EXAM: PORTABLE CHEST 1 VIEW COMPARISON:  04/14/2023 FINDINGS: Small approximately 10% right apical pneumothorax shows no significant change. Pleural pigtail catheter again seen along the right hemidiaphragm. No evidence of pulmonary infiltrate or pleural fluid. Heart size is  normal. IMPRESSION: No significant change in small right apical pneumothorax. Electronically Signed   By: Danae Orleans M.D.   On: 04/15/2023 10:10   DG CHEST PORT 1 VIEW  Result Date: 04/14/2023 CLINICAL DATA:  Chest tube EXAM: PORTABLE CHEST 1 VIEW COMPARISON:  Portable exam 0611 hours compared to 04/13/2023 FINDINGS: Pigtail thoracostomy tube at inferior RIGHT hemithorax. Normal heart size, mediastinal contours, and pulmonary vascularity. Atherosclerotic calcification aorta. Emphysematous changes consistent with COPD.  Small residual RIGHT apex pneumothorax. Remaining lungs clear. Skin folds project over LEFT upper lobe. Osseous structures unremarkable. Calcified mass RIGHT upper quadrant 3.1 cm diameter, known renal lesion by prior PET-CT. IMPRESSION: COPD changes with small residual RIGHT apex pneumothorax. Aortic Atherosclerosis (ICD10-I70.0) and Emphysema (ICD10-J43.9). Electronically Signed   By: Ulyses Southward M.D.   On: 04/14/2023 08:40   DG CHEST PORT 1 VIEW  Result Date: 04/13/2023 CLINICAL DATA:  Chest tube. EXAM: PORTABLE CHEST 1 VIEW COMPARISON:  Apr 12, 2023. FINDINGS: The heart size and mediastinal contours are within normal limits. Stable right basilar chest tube is noted. Small right apical pneumothorax is noted and unchanged. No significant pulmonary consolidative process is noted. The visualized skeletal structures are unremarkable. IMPRESSION: Stable right-sided chest tube with stable small right apical pneumothorax. Electronically Signed   By: Lupita Raider M.D.   On: 04/13/2023 08:11   DG CHEST PORT 1 VIEW  Result Date: 04/12/2023 CLINICAL DATA:  [address] Hydropneumothorax [id number] EXAM: PORTABLE CHEST 1 VIEW COMPARISON:  04/11/2023 FINDINGS: Right basilar chest tube remains in place. Similar appearance with potential tube kinking at the skin entry site. Persistent right hydropneumothorax. Air component estimated at 10-15%. Moderate-large layering fluid component. Persistent but slightly improving right basilar airspace opacity. Normal heart size. No abnormal shift of the heart or mediastinum. Left lung is clear. No left-sided pleural effusion or pneumothorax. IMPRESSION: 1. Persistent right hydropneumothorax with right basilar chest tube in place. Similar appearance with potential tube kinking at the skin entry site. 2. Persistent but slightly improving right basilar airspace opacity. Electronically Signed   By: Duanne Guess D.O.   On: 04/12/2023 08:18   DG CHEST PORT 1 VIEW  Result Date:  04/11/2023 CLINICAL DATA:  Left pneumothorax and pleural effusion, chest tube placement EXAM: PORTABLE CHEST 1 VIEW COMPARISON:  04/11/2023 at 8:03 a.m. FINDINGS: Interval placement of a pigtail pleural drainage catheter along the right lung base. This might possibly be partially kinked at the cutaneous margin where it seems to make an abrupt turn, although this may be projectional. Persistent 10% right pneumothorax. Persistent large right pleural fluid collection. Aside for mild basilar scarring the left lung remains clear. Atherosclerotic calcification of the aortic arch. Heart size within normal limits. IMPRESSION: 1. Interval placement of a pigtail pleural drainage catheter along the right lung base. Cannot exclude kinking at the skin surface, visual inspection suggested. Persistent 10% right pneumothorax and persistent large right pleural fluid collection. Electronically Signed   By: Gaylyn Rong M.D.   On: 04/11/2023 14:38   DG CHEST PORT 1 VIEW  Result Date: 04/11/2023 CLINICAL DATA:  Hydropneumothorax EXAM: PORTABLE CHEST 1 VIEW COMPARISON:  04/10/2023, 04/06/2023 FINDINGS: Single frontal view of the chest demonstrates a persistent right-sided hydropneumothorax, with no significant change in the gas component since prior exam. Slight increase in fluid component and right basilar consolidation since prior exam. Left chest is clear. Cardiac silhouette is stable. No acute fracture. IMPRESSION: 1. Persistent right-sided hydropneumothorax. Stable gas component, with slight increase in fluid component and right  basilar consolidation since prior exam. Please refer to recent PET-CT describing underlying right middle lobe bronchogenic carcinoma, as well as nodal and bony metastases. Electronically Signed   By: Sharlet Salina M.D.   On: 04/11/2023 08:30   DG Chest 1 View  Result Date: 04/10/2023 CLINICAL DATA:  Shortness of breath for 1 month.  Mild cough EXAM: CHEST  1 VIEW COMPARISON:  PET-CT  04/06/2023.  Older CT scan and x-rays as well FINDINGS: Right-sided hydropneumothorax. Moderate pneumothorax component similar to the prior CT scan. Adjacent right lung base opacity. Left lung is hyperinflated. Normal cardiopericardial silhouette. No edema. IMPRESSION: Known right-sided hydropneumothorax. Hyperinflation. Please correlate with prior PET-CT scan of 04/06/2019 Electronically Signed   By: Karen Kays M.D.   On: 04/10/2023 17:28   NM PET Image Initial (PI) Skull Base To Thigh  Result Date: 04/10/2023 CLINICAL DATA:  Initial treatment strategy for non-small-cell lung cancer. Initial staging. EXAM: NUCLEAR MEDICINE PET SKULL BASE TO THIGH TECHNIQUE: 6.5 mCi F-18 FDG was injected intravenously. Full-ring PET imaging was performed from the skull base to thigh after the radiotracer. CT data was obtained and used for attenuation correction and anatomic localization. Fasting blood glucose: 111 mg/dl COMPARISON:  57/84/6962 FINDINGS: Mediastinal blood pool activity: SUV max 2.5 Liver activity: SUV max NA NECK: No areas of abnormal hypermetabolism. Incidental CT findings: No cervical adenopathy. Right maxillary sinus mucosal thickening. Ethmoid air cell and right frontal sinus mucosal thickening. CHEST: The right middle lobe lung lesion detailed on chest CT is hypermetabolic. Example nodule of 2.8 x 2.6 cm and a S.U.V. max of 9.1 on 128/3. Ipsilateral nodal metastasis including a low right mediastinal node of 8 mm and a S.U.V. max of 5.3 on 113/3. Incidental CT findings: Persistent moderate right pleural effusion. Development of a small right-sided pneumothorax, estimated at 15-20%. Aortic and coronary artery calcification. ABDOMEN/PELVIS: Bilateral, left-greater-than-right adrenal hypermetabolism with mild thickening and nodularity bilaterally. Example on the left at a S.U.V. max of 4.4. No hepatic hypermetabolism to correspond to the hyperenhancing liver lesions on CT. Isolated gastrohepatic ligament node  measures 5 mm and a S.U.V. max of 4.2 on 158/3. Incidental CT findings: Non tracer avid 3.1 cm peripherally calcified interpolar right renal mass. Abdominal aortic atherosclerosis. SKELETON: Multiple hypermetabolic, primarily lytic osseous lesions. Example within the posterior right femoral shaft proximally at a S.U.V. max of 5.7 on 252/2. Correlate cortical disruption. Involving the right acetabulum and corresponding ill-defined lucency including at a S.U.V. max of 9.4 on 224/3. Index right scapular lesion with cortical destruction involving the acromion process including at a S.U.V. max of 5.7 on 56/3. Incidental CT findings: Third and fourth anterolateral left rib cortical destruction. IMPRESSION: 1. Right middle lobe primary bronchogenic carcinoma with mediastinal nodal, osseous, and probable upper abdominal nodal metastasis. 2. Development of a right-sided pneumothorax since 03/16/2022 with persistent moderate right pleural effusion. These results will be called to the ordering clinician or representative by the Radiologist Assistant, and communication documented in the PACS or [employer]. 3. Metastasis within the right acetabulum and proximal right femur may predispose the patient to fracture with weight-bearing. Consider orthopedic consultation. 4. Bilateral adrenal hypermetabolism with mild nodularity. Suspicious for early adrenal metastasis. 5. The right renal mass is not hypermetabolic, favored to represent a complex cyst. The liver lesions are not hypermetabolic and may represent benign entities such as hemangiomas. Recommend attention on follow-up. 6. Incidental findings, including: Sinus disease. Coronary artery atherosclerosis. Aortic Atherosclerosis (ICD10-I70.0). Electronically Signed   By: Hosie Spangle.D.  On: 04/10/2023 15:37   MR Brain W Wo Contrast  Result Date: 03/30/2023 CLINICAL DATA:  Non-small cell lung cancer (NSCLC), staging EXAM: MRI HEAD WITHOUT AND WITH CONTRAST MRA HEAD  WITHOUT CONTRAST TECHNIQUE: Multiplanar, multi-echo pulse sequences of the brain and surrounding structures were acquired without and with intravenous contrast. Angiographic images of the Circle of Willis were acquired using MRA technique without intravenous contrast. CONTRAST:  7mL GADAVIST GADOBUTROL 1 MMOL/ML IV SOLN COMPARISON:  None Available. FINDINGS: MRI HEAD FINDINGS Brain: Findings compatible with metastatic disease with multiple peripherally enhancing lesions. This includes a peripherally enhancing 1.5 x 1.7 cm lesion in the left frontal lobe (series 23, image 105). Complex peripherally enhancing 1.5 cm lesion more superiorly along the high left frontal lobe near the vertex (image 124). Approximately 5 mm peripherally enhancing lesion in the right frontal lobe (image 116). Probable peripherally enhancing 5 mm lesion in the right occipital lobe (image 51) and in the posterior left temporal lobe (image 53). Approximately 8 mm enhancing lesion in the left parietal lobe (series 23, image 108). Possible punctate lesion in the right frontal operculum (image 83) and parasagittal right frontal lobe (image 117). Ill-defined enhancing lesion in the right temporal lobe (image 71). Possible punctate enhancing lesion in the superior cerebellum (image 53). No midline shift, acute hemorrhage, acute infarct or hydrocephalus. Vascular: Major arterial flow voids are maintained at the skull base. Skull and upper cervical spine: Normal marrow signal. Sinuses/Orbits: Paranasal sinus mucosal thickening. No acute orbital findings. Other: No mastoid effusions. MRA HEAD FINDINGS Anterior circulation: Bilateral intracranial ICAs, MCAs, and ACAs are patent without proximal high-grade stenosis. Posterior circulation: Bilateral intradural vertebral arteries, basilar artery and bilateral posterior cerebral arteries are patent without proximal hemodynamically significant stenosis. IMPRESSION: MRI: Many enhancing lesions in the brain,  detailed above and compatible with metastatic disease. The dominant/largest lesions are in the left frontal lobe. MRA: No large vessel occlusion or proximal high-grade stenosis. Electronically Signed   By: Feliberto Harts M.D.   On: 03/30/2023 10:58   MR ANGIO HEAD WO CONTRAST  Result Date: 03/30/2023 CLINICAL DATA:  Non-small cell lung cancer (NSCLC), staging EXAM: MRI HEAD WITHOUT AND WITH CONTRAST MRA HEAD WITHOUT CONTRAST TECHNIQUE: Multiplanar, multi-echo pulse sequences of the brain and surrounding structures were acquired without and with intravenous contrast. Angiographic images of the Circle of Willis were acquired using MRA technique without intravenous contrast. CONTRAST:  7mL GADAVIST GADOBUTROL 1 MMOL/ML IV SOLN COMPARISON:  None Available. FINDINGS: MRI HEAD FINDINGS Brain: Findings compatible with metastatic disease with multiple peripherally enhancing lesions. This includes a peripherally enhancing 1.5 x 1.7 cm lesion in the left frontal lobe (series 23, image 105). Complex peripherally enhancing 1.5 cm lesion more superiorly along the high left frontal lobe near the vertex (image 124). Approximately 5 mm peripherally enhancing lesion in the right frontal lobe (image 116). Probable peripherally enhancing 5 mm lesion in the right occipital lobe (image 51) and in the posterior left temporal lobe (image 53). Approximately 8 mm enhancing lesion in the left parietal lobe (series 23, image 108). Possible punctate lesion in the right frontal operculum (image 83) and parasagittal right frontal lobe (image 117). Ill-defined enhancing lesion in the right temporal lobe (image 71). Possible punctate enhancing lesion in the superior cerebellum (image 53). No midline shift, acute hemorrhage, acute infarct or hydrocephalus. Vascular: Major arterial flow voids are maintained at the skull base. Skull and upper cervical spine: Normal marrow signal. Sinuses/Orbits: Paranasal sinus mucosal thickening. No acute  orbital findings.  Other: No mastoid effusions. MRA HEAD FINDINGS Anterior circulation: Bilateral intracranial ICAs, MCAs, and ACAs are patent without proximal high-grade stenosis. Posterior circulation: Bilateral intradural vertebral arteries, basilar artery and bilateral posterior cerebral arteries are patent without proximal hemodynamically significant stenosis. IMPRESSION: MRI: Many enhancing lesions in the brain, detailed above and compatible with metastatic disease. The dominant/largest lesions are in the left frontal lobe. MRA: No large vessel occlusion or proximal high-grade stenosis. Electronically Signed   By: Feliberto Harts M.D.   On: 03/30/2023 10:58

## 2023-04-24 NOTE — Patient Instructions (Addendum)
[facility]  Discharge Instructions  You were seen and examined today by Dr. Ellin Saba. Dr. Ellin Saba is a medical oncologist, meaning that he specializes in the treatment of cancer diagnoses. Dr. Ellin Saba discussed your past medical history, family history of cancers, and the events that led to you being here today.  You were referred to Dr. Ellin Saba due to a new diagnosis of lung cancer. Dr. Ellin Saba has reviewed your PET scan and brain MRI which revealed that the cancer has spread beyond your lungs and to your lymph nodes, bone, adrenal glands, and brain. 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 We cannot cure this but we can control it.  Dr. Ellin Saba has recommended additional testing on the biopsy taken known as NGS testing. This test is currently pending.  Dr. Ellin Saba will refer you to have a Port-A-Cath placed.  Dr. Ellin Saba will order a MRI of your pelvis to visualize the cancer spread to that area further.  You will be referred for radiation therapy.  Follow-up as scheduled.  Thank you for choosing [name] to provide your oncology and hematology care.   To afford each patient quality time with our provider, please arrive at least 15 minutes before your scheduled appointment time. You may need to reschedule your appointment if you arrive late (10 or more minutes). Arriving late affects you and other patients whose appointments are after yours.  Also, if you miss three or more appointments without notifying the office, you may be dismissed from the clinic at the provider's discretion.    Again, thank you for choosing [facility].  Our hope is that these requests will decrease the amount of time that you wait before being seen by our physicians.   If you have a lab appointment with the Cancer Center - please note that after April 8th, all labs will be drawn in the cancer center.  You do not have to  check in or register with the main entrance as you have in the past but will complete your check-in at the cancer center.            _____________________________________________________________  Should you have questions after your visit to [facility], please contact our office at [phone] and follow the prompts.  Our office hours are 8:00 a.m. to 4:30 p.m. Monday - Thursday and 8:00 a.m. to 2:30 p.m. Friday.  Please note that voicemails left after 4:00 p.m. may not be returned until the following business day.  We are closed weekends and all major holidays.  You do have access to a nurse 24-7, just call the main number to the clinic [phone] and do not press any options, hold on the line and a nurse will answer the phone.    For prescription refill requests, have your pharmacy contact our office and allow 72 hours.    Masks are no longer required in the cancer centers. If you would like for your care team to wear a mask while they are taking care of you, please let them know. You may have one support person who is at least 68 years old accompany you for your appointments.

## 2023-04-25 ENCOUNTER — Ambulatory Visit (HOSPITAL_COMMUNITY): Admission: RE | Admit: 2023-04-25 | Payer: Medicare Other | Source: Ambulatory Visit

## 2023-04-26 ENCOUNTER — Other Ambulatory Visit: Payer: Self-pay

## 2023-04-26 ENCOUNTER — Ambulatory Visit (HOSPITAL_COMMUNITY): Admission: RE | Admit: 2023-04-26 | Payer: Medicare Other | Source: Ambulatory Visit

## 2023-04-26 MED ORDER — ALPRAZOLAM 0.5 MG PO TABS: 0.5000 mg | ORAL_TABLET | Freq: Once | ORAL | 0 refills | Status: AC

## 2023-04-27 ENCOUNTER — Encounter (HOSPITAL_COMMUNITY): Payer: Self-pay

## 2023-04-27 ENCOUNTER — Other Ambulatory Visit: Payer: Self-pay | Admitting: *Deleted

## 2023-04-27 ENCOUNTER — Ambulatory Visit (HOSPITAL_COMMUNITY)
Admission: RE | Admit: 2023-04-27 | Discharge: 2023-04-27 | Disposition: A | Discharge status: Home / Self Care | Payer: Medicare Other | Source: Ambulatory Visit | Attending: Hematology | Admitting: Hematology

## 2023-04-27 DIAGNOSIS — C3490 Malignant neoplasm of unspecified part of unspecified bronchus or lung: Secondary | ICD-10-CM | POA: Diagnosis not present

## 2023-04-27 MED ORDER — GADOBUTROL 1 MMOL/ML IV SOLN
5.5000 mL | Freq: Once | INTRAVENOUS | Status: AC | PRN
  Administered 2023-04-27: 5.5 mL via INTRAVENOUS

## 2023-04-27 NOTE — Progress Notes (Signed)
The proposed treatment discussed in conference is for discussion purpose only and is not a binding recommendation.  The patients have not been physically examined, or presented with their treatment options.  Therefore, final treatment plans cannot be decided.  

## 2023-04-28 ENCOUNTER — Encounter (HOSPITAL_COMMUNITY): Payer: Self-pay

## 2023-04-28 ENCOUNTER — Ambulatory Visit (HOSPITAL_COMMUNITY): Payer: Medicare Other

## 2023-05-01 NOTE — Progress Notes (Signed)
Radiation Oncology         [phone] ________________________________  Name: Theodore [name]        MRN: [id number]  Date of Service: 05/02/2023 DOB: March 14, 1956  WG:[id number], [name], MD  [name], MD     REFERRING PHYSICIAN: [name], MD   DIAGNOSIS: The primary encounter diagnosis was Malignant neoplasm of right lung, unspecified part of lung (HCC). Diagnoses of Secondary malignant neoplasm of bone (HCC) and Secondary malignant neoplasm of brain [facility]) were also pertinent to this visit.   HISTORY OF PRESENT ILLNESS: Theodore [name] is a 67 y.o. male seen at the request of Dr. Ellin Saba for a newly diagnosed lung cancer. The patient presented with chest pain to his PCP and a CXR showed a right pleural effusion and atalectasis could not rule out a tumor. A CT scan  on 03/17/23 showed a 28 cm mass in the RML with associated complete collapse of the RML and nodularity of the right minor fissure. THere wree multiple enhancing liver lesions and a lytic change of the right scapula left 3 rd and 4th ribs. an MRI brain on 03/30/23 showed at least 11 called, but multiple brain metastases. A  PET scan on 04/06/2023 showed hypermetabolic activity within the right middle lobe, mediastinum including adenopathy extending into the upper abdominal chains, with osseous disease as well.  Hypermetabolic activity was seen in the right acetabulum, proximal right femur, bilateral adrenal glands, and right scapula as well as destruction involving the third and fourth anterolateral left ribs.  He underwent bronchoscopy on 04/18/2023, fine-needle aspirate of the right middle lobe showed a non-small cell carcinoma consistent with adenocarcinoma. He met with Dr. Ellin Saba and will soon begin systemic therapy and is seen to consider palliative radiotherapy for the brain and right femur/acetabulum.     PREVIOUS RADIATION THERAPY: No   PAST MEDICAL HISTORY:  Past Medical History:  Diagnosis Date   COPD  (chronic obstructive pulmonary disease) (HCC)        PAST SURGICAL HISTORY: Past Surgical History:  Procedure Laterality Date   BRONCHIAL BIOPSY  04/18/2023   Procedure: BRONCHIAL BIOPSIES;  Surgeon: Josephine Igo, DO;  Location: [facility] ENDOSCOPY;  Service: Pulmonary;;   BRONCHIAL NEEDLE ASPIRATION BIOPSY  04/18/2023   Procedure: BRONCHIAL NEEDLE ASPIRATION BIOPSIES;  Surgeon: Josephine Igo, DO;  Location: [facility] ENDOSCOPY;  Service: Pulmonary;;   CHEST TUBE INSERTION Right 04/11/2023   Procedure: CHEST TUBE INSERTION;  Surgeon: Omar Person, MD;  Location: [facility] ENDOSCOPY;  Service: Pulmonary;  Laterality: Right;  fentanyl IV only   excision of cyst left ankle     MASS EXCISION Left 08/27/2018   Procedure: EXCISION LIPOMA LEFT BUTTOCK;  Surgeon: Franky Macho, MD;  Location: [facility] ORS;  Service: General;  Laterality: Left;     FAMILY HISTORY:  Family History  Problem Relation Age of Onset   Colon cancer Maternal Grandmother    Colon cancer Maternal Uncle      SOCIAL HISTORY:  reports that he quit smoking about 12 years ago. His smoking use included cigarettes. He has never used smokeless tobacco. He reports that he does not currently use alcohol. He reports that he does not currently use drugs.   ALLERGIES: Maalox [calcium carbonate antacid]   MEDICATIONS:  Current Outpatient Medications  Medication Sig Dispense Refill   acetaminophen (TYLENOL) 325 MG tablet Take 2 tablets (650 mg total) by mouth every 6 (six) hours as needed for mild pain, moderate pain, headache or fever (or Fever >/=  101).     albuterol (PROVENTIL HFA;VENTOLIN HFA) 108 (90 Base) MCG/ACT inhaler Inhale 2 puffs into the lungs every 4 (four) hours as needed for wheezing or shortness of breath.     folic acid (FOLVITE) 1 MG tablet Take 1 tablet (1 mg total) by mouth daily. 30 tablet 1   ibuprofen (ADVIL) 200 MG tablet Take 400 mg by mouth every 6 (six) hours as needed for moderate pain.     SYMBICORT 160-4.5 MCG/ACT  inhaler Inhale 1 puff into the lungs 2 (two) times daily as needed.  11   No current facility-administered medications for this encounter.     REVIEW OF SYSTEMS: On review of systems, the patient reports that he is doing well. He denies headaches, visual or auditory changes. He denies any nausea, dizziness, uncontrolled movements or seizure. He reports occasional numbness in his fingertips. He has lost about 20 pounds in the last few months, and has felt more tired, "maybe winded" with exertion but not short of breath. He does have a cough with clear phlegm and no hemoptysis. He has discomfort in his right upper back when reaching for something above him, especially in his kitchen when trying to cook. He denies any pain his his hip or pelvis. No other complaints are verbalized.      PHYSICAL EXAM:  Wt Readings from Last 3 Encounters:  05/02/23 124 lb (56.2 kg)  04/24/23 124 lb (56.2 kg)  04/10/23 135 lb (61.2 kg)   Temp Readings from Last 3 Encounters:  05/02/23 (!) 97.5 F (36.4 C)  04/24/23 (!) 97.3 F (36.3 C) (Tympanic)  04/19/23 98.3 F (36.8 C) (Oral)   BP Readings from Last 3 Encounters:  05/02/23 107/73  04/24/23 118/88  04/19/23 124/77   Pulse Readings from Last 3 Encounters:  05/02/23 (!) 115  04/24/23 (!) 110  04/19/23 94   Pain Assessment Pain Score: 0-No pain/10  In general this is a thin, but otherwise well appearing African American in no acute distress. He's alert and oriented x4 and appropriate throughout the examination. Cardiopulmonary assessment is negative for acute distress and he exhibits normal effort.     ECOG = 1  0 - Asymptomatic (Fully active, able to carry on all predisease activities without restriction)  1 - Symptomatic but completely ambulatory (Restricted in physically strenuous activity but ambulatory and able to carry out work of a light or sedentary nature. For example, light housework, office work)  2 - Symptomatic, <50% in bed  during the day (Ambulatory and capable of all self care but unable to carry out any work activities. Up and about more than 50% of waking hours)  3 - Symptomatic, >50% in bed, but not bedbound (Capable of only limited self-care, confined to bed or chair 50% or more of waking hours)  4 - Bedbound (Completely disabled. Cannot carry on any self-care. Totally confined to bed or chair)  5 - Death   Santiago Glad MM, Creech RH, Tormey DC, et al. [phone]). "Toxicity and response criteria of the [facility]". Am. Evlyn Clines. Oncol. 5 (6): 649-55    LABORATORY DATA:  Lab Results  Component Value Date   WBC 16.4 (H) 04/19/2023   HGB 14.6 04/19/2023   HCT 43.1 04/19/2023   MCV 82.4 04/19/2023   PLT 379 04/19/2023   Lab Results  Component Value Date   NA 130 (L) 04/19/2023   K 4.0 04/19/2023   CL 96 (L) 04/19/2023   CO2 21 (L) 04/19/2023  Lab Results  Component Value Date   ALT 23 04/11/2023   AST 17 04/11/2023   ALKPHOS 115 04/11/2023   BILITOT 0.6 04/11/2023      RADIOGRAPHY: MR PELVIS W WO CONTRAST  Result Date: 04/27/2023 CLINICAL DATA:  Bone lesion, pelvis, incidental. Metastatic disease evaluation with pain. Non-small cell lung cancer. EXAM: MRI PELVIS WITHOUT AND WITH CONTRAST TECHNIQUE: Multiplanar multisequence MR imaging of the pelvis was performed both before and after administration of intravenous contrast. CONTRAST:  5.9mL GADAVIST GADOBUTROL 1 MMOL/ML IV SOLN COMPARISON:  PET-CT 04/06/2023. FINDINGS: Urinary Tract: The visualized distal ureters and bladder appear unremarkable. Bowel: No bowel wall thickening, distention or surrounding inflammation identified within the pelvis. Vascular/Lymphatic: No enlarged pelvic lymph nodes identified. No significant vascular findings. Reproductive: The prostate gland is mildly enlarged with central heterogeneity, likely due to BPH. Other: Small amount of pelvic ascites. Musculoskeletal: As demonstrated on recent PET-CT, there is  widespread osseous metastatic disease throughout the bony pelvis which is asymmetric to the right. These lesions demonstrate decreased T1 signal, increased T2 signal and heterogeneous enhancement following contrast. There are multiple lesions involving the right iliac bone, including the superior acetabulum. There is asymmetric involvement of the right superior pubic ramus with surrounding soft tissue edema and enhancement, findings which may reflect an early pathologic fracture. A lesion involving the right lesser femoral trochanter measures up to 4.2 cm on image 17/16 and is associated with cortical thinning, also at risk for pathologic fracture. A single small metastasis is noted superiorly in the left iliac wing. There is multilevel spondylosis in the visualized lower lumbar spine. No periarticular soft tissue masses are identified. IMPRESSION: 1. Widespread osseous metastatic disease throughout the bony pelvis, asymmetric to the right. 2. Asymmetric involvement of the right superior pubic ramus with surrounding soft tissue edema and enhancement, findings which may reflect an early pathologic fracture. 3. Additional lesion involving the right lesser femoral trochanter is associated with cortical thinning, also at risk for pathologic fracture. 4. No periarticular soft tissue masses identified. 5. Small amount of pelvic ascites. Electronically Signed   By: Carey Bullocks M.D.   On: 04/27/2023 18:14   DG CHEST PORT 1 VIEW  Result Date: 04/19/2023 CLINICAL DATA:  Chest tube removal. EXAM: PORTABLE CHEST 1 VIEW COMPARISON:  Radiograph earlier today. CT yesterday FINDINGS: Right pigtail catheter has been removed. There is no pneumothorax. Small right pleural effusion persists. The exam is otherwise unchanged. IMPRESSION: Removal of right chest tube. No pneumothorax. Small right pleural effusion persists. Electronically Signed   By: Narda Rutherford M.D.   On: 04/19/2023 14:50   DG CHEST PORT 1 VIEW  Result  Date: 04/19/2023 CLINICAL DATA:  Hypoxemia EXAM: PORTABLE CHEST 1 VIEW COMPARISON:  04/18/2023 FINDINGS: The lungs are hyperinflated in keeping with changes of underlying COPD. The lungs are clear. Right basilar pigtail chest tube is in place laterally. Small right pleural effusion is present. No pneumothorax. Cardiac size within normal limits. Pulmonary vascularity is normal. No acute bone abnormality. IMPRESSION: 1. Right basilar pigtail chest tube in place. Small right pleural effusion. No pneumothorax. Electronically Signed   By: Helyn Numbers M.D.   On: 04/19/2023 02:46   DG Chest Port 1 View  Result Date: 04/18/2023 CLINICAL DATA:  Status post bronchoscopy. EXAM: PORTABLE CHEST 1 VIEW COMPARISON:  Same day. FINDINGS: The heart size and mediastinal contours are within normal limits. Stable appearance of partial atelectasis of right middle lobe. Stable mild scarring or subsegmental atelectasis is noted in inferior portion  of right upper lobe. No pneumothorax is noted. Right basilar chest tube is unchanged in position. The visualized skeletal structures are unremarkable. IMPRESSION: No pneumothorax status post bronchoscopy. Stable partial atelectasis of right middle lobe as well as mild scarring or subsegmental atelectasis in inferior portion of right upper lobe. Electronically Signed   By: Lupita Raider M.D.   On: 04/18/2023 14:57   DG C-ARM BRONCHOSCOPY  Result Date: 04/18/2023 C-ARM BRONCHOSCOPY: Fluoroscopy was utilized by the requesting physician.  No radiographic interpretation.   DG CHEST PORT 1 VIEW  Result Date: 04/18/2023 CLINICAL DATA:  Pleural effusion. EXAM: PORTABLE CHEST 1 VIEW COMPARISON:  Radiograph yesterday. Blunt chest CT available at time of radiograph interpretation. FINDINGS: Right pigtail catheter is coiled at the right lung base. Small right pleural effusion. No definite pneumothorax. Ill-defined opacities in the right mid lower lung zone, subsequently assessed on chest CT. The  heart is normal in size with stable mediastinal contours. Background hyperinflation. IMPRESSION: 1. Right pigtail catheter coiled at the right lung base. Small right pleural effusion. No definite pneumothorax. 2. Ill-defined opacities in the right mid lower lung zone, subsequently assessed on chest CT. Electronically Signed   By: Narda Rutherford M.D.   On: 04/18/2023 12:06   CT Super D Chest Wo Contrast  Result Date: 04/18/2023 CLINICAL DATA:  Progressive shortness of breath. New diagnosed of lung cancer. Recent pneumothorax. * Tracking Code: BO * EXAM: CT CHEST WITHOUT CONTRAST TECHNIQUE: Multidetector CT imaging of the chest was performed using thin slice collimation for electromagnetic bronchoscopy planning purposes, without intravenous contrast. RADIATION DOSE REDUCTION: This exam was performed according to the departmental dose-optimization program which includes automated exposure control, adjustment of the mA and/or kV according to patient size and/or use of iterative reconstruction technique. COMPARISON:  04/06/2023 FINDINGS: Cardiovascular: Normal heart size. Aortic atherosclerosis and coronary artery calcifications. No pericardial effusion. Mediastinum/Nodes: Thyroid gland, trachea, and esophagus are unremarkable. No enlarged mediastinal or axillary lymph nodes. The hilar lymph nodes are suboptimally evaluated due to lack of IV contrast. Lungs/Pleura: Percutaneous pigtail drainage catheter is identified overlying the right anterior lung base. The previous right pneumothorax has resolved in the interval. Small right pleural effusion has decreased in volume compared with the previous exam. Tracer avid lesion within the perihilar right lung is again seen measuring approximate 2.8 by 2.3 cm, image 115/3. Similar to previous exam. There is associated postobstructive atelectasis of the entire right middle lobe. Similar appearance of pleural nodularity along the right major and minor fissure concerning for  lymphangitic spread of disease. Upper Abdomen: No acute abnormality. Unchanged, non FDG avid enhancing lesion and right lobe of liver measuring 1.1 cm. Favor benign hemangioma. Unchanged, exophytic right renal mass with peripheral calcification. No FDG uptake noted within this lesion on the previous PET-CT which is favored to represent a benign complex cyst. No follow-up imaging recommended. Musculoskeletal: Lytic lesion involving the right scapula is again seen measuring 3.3 cm on today's study, image 11/3. Previously 3 cm. Lytic lesion within the head of the left clavicle measures 1 cm, image 26/3. Previously this measured the same. Unchanged lytic lesion involving the anterolateral aspect of the left third rib and fourth rib, image 47/3 and image 56/3. IMPRESSION: 1. Percutaneous pigtail drainage catheter is identified overlying the right anterior lung base. The previous right pneumothorax has resolved in the interval. 2. Small right pleural effusion has decreased in volume compared with the previous exam. 3. Similar appearance of perihilar right lung mass with associated postobstructive atelectasis  of the entire right middle lobe. 4. Unchanged appearance of pleural nodularity along the right major and minor fissure concerning for lymphangitic spread of disease. 5. Stable appearance of lytic lesions involving the right scapula, head of left clavicle, and left third and fourth ribs. 6. Aortic Atherosclerosis (ICD10-I70.0) and Emphysema (ICD10-J43.9). Electronically Signed   By: Signa Kell M.D.   On: 04/18/2023 08:34   DG CHEST PORT 1 VIEW  Result Date: 04/17/2023 CLINICAL DATA:  Chest tube EXAM: PORTABLE CHEST 1 VIEW COMPARISON:  Portable exam 0533 hours compared to 04/15/2023 FINDINGS: Pigtail drainage catheter at LEFT lung base versus RIGHT upper quadrant. Normal heart size, mediastinal contours, and pulmonary vascularity. Atherosclerotic calcification aorta. Increased opacity at medial RIGHT lung base  could represent atelectasis, loculated fluid, no mass at this site by recent PET-CT. Lungs appear emphysematous with skin folds projecting over RIGHT upper lobe on 1. Minimal subsegmental atelectasis RIGHT upper lobe. No additional infiltrate or effusion. Trace RIGHT apex pneumothorax. IMPRESSION: Trace RIGHT apex pneumothorax decreased from 04/15/2023. Electronically Signed   By: Ulyses Southward M.D.   On: 04/17/2023 12:54   DG CHEST PORT 1 VIEW  Result Date: 04/15/2023 CLINICAL DATA:  Follow-up right pneumothorax. EXAM: PORTABLE CHEST 1 VIEW COMPARISON:  04/14/2023 FINDINGS: Small approximately 10% right apical pneumothorax shows no significant change. Pleural pigtail catheter again seen along the right hemidiaphragm. No evidence of pulmonary infiltrate or pleural fluid. Heart size is normal. IMPRESSION: No significant change in small right apical pneumothorax. Electronically Signed   By: Danae Orleans M.D.   On: 04/15/2023 10:10   DG CHEST PORT 1 VIEW  Result Date: 04/14/2023 CLINICAL DATA:  Chest tube EXAM: PORTABLE CHEST 1 VIEW COMPARISON:  Portable exam 0611 hours compared to 04/13/2023 FINDINGS: Pigtail thoracostomy tube at inferior RIGHT hemithorax. Normal heart size, mediastinal contours, and pulmonary vascularity. Atherosclerotic calcification aorta. Emphysematous changes consistent with COPD. Small residual RIGHT apex pneumothorax. Remaining lungs clear. Skin folds project over LEFT upper lobe. Osseous structures unremarkable. Calcified mass RIGHT upper quadrant 3.1 cm diameter, known renal lesion by prior PET-CT. IMPRESSION: COPD changes with small residual RIGHT apex pneumothorax. Aortic Atherosclerosis (ICD10-I70.0) and Emphysema (ICD10-J43.9). Electronically Signed   By: Ulyses Southward M.D.   On: 04/14/2023 08:40   DG CHEST PORT 1 VIEW  Result Date: 04/13/2023 CLINICAL DATA:  Chest tube. EXAM: PORTABLE CHEST 1 VIEW COMPARISON:  Apr 12, 2023. FINDINGS: The heart size and mediastinal contours are within  normal limits. Stable right basilar chest tube is noted. Small right apical pneumothorax is noted and unchanged. No significant pulmonary consolidative process is noted. The visualized skeletal structures are unremarkable. IMPRESSION: Stable right-sided chest tube with stable small right apical pneumothorax. Electronically Signed   By: Lupita Raider M.D.   On: 04/13/2023 08:11   DG CHEST PORT 1 VIEW  Result Date: 04/12/2023 CLINICAL DATA:  16109 Hydropneumothorax 60454 EXAM: PORTABLE CHEST 1 VIEW COMPARISON:  04/11/2023 FINDINGS: Right basilar chest tube remains in place. Similar appearance with potential tube kinking at the skin entry site. Persistent right hydropneumothorax. Air component estimated at 10-15%. Moderate-large layering fluid component. Persistent but slightly improving right basilar airspace opacity. Normal heart size. No abnormal shift of the heart or mediastinum. Left lung is clear. No left-sided pleural effusion or pneumothorax. IMPRESSION: 1. Persistent right hydropneumothorax with right basilar chest tube in place. Similar appearance with potential tube kinking at the skin entry site. 2. Persistent but slightly improving right basilar airspace opacity. Electronically Signed   By: Janyth Pupa  Plundo D.O.   On: 04/12/2023 08:18   DG CHEST PORT 1 VIEW  Result Date: 04/11/2023 CLINICAL DATA:  Left pneumothorax and pleural effusion, chest tube placement EXAM: PORTABLE CHEST 1 VIEW COMPARISON:  04/11/2023 at 8:03 a.m. FINDINGS: Interval placement of a pigtail pleural drainage catheter along the right lung base. This might possibly be partially kinked at the cutaneous margin where it seems to make an abrupt turn, although this may be projectional. Persistent 10% right pneumothorax. Persistent large right pleural fluid collection. Aside for mild basilar scarring the left lung remains clear. Atherosclerotic calcification of the aortic arch. Heart size within normal limits. IMPRESSION: 1. Interval  placement of a pigtail pleural drainage catheter along the right lung base. Cannot exclude kinking at the skin surface, visual inspection suggested. Persistent 10% right pneumothorax and persistent large right pleural fluid collection. Electronically Signed   By: Gaylyn Rong M.D.   On: 04/11/2023 14:38   DG CHEST PORT 1 VIEW  Result Date: 04/11/2023 CLINICAL DATA:  Hydropneumothorax EXAM: PORTABLE CHEST 1 VIEW COMPARISON:  04/10/2023, 04/06/2023 FINDINGS: Single frontal view of the chest demonstrates a persistent right-sided hydropneumothorax, with no significant change in the gas component since prior exam. Slight increase in fluid component and right basilar consolidation since prior exam. Left chest is clear. Cardiac silhouette is stable. No acute fracture. IMPRESSION: 1. Persistent right-sided hydropneumothorax. Stable gas component, with slight increase in fluid component and right basilar consolidation since prior exam. Please refer to recent PET-CT describing underlying right middle lobe bronchogenic carcinoma, as well as nodal and bony metastases. Electronically Signed   By: Sharlet Salina M.D.   On: 04/11/2023 08:30   DG Chest 1 View  Result Date: 04/10/2023 CLINICAL DATA:  Shortness of breath for 1 month.  Mild cough EXAM: CHEST  1 VIEW COMPARISON:  PET-CT 04/06/2023.  Older CT scan and x-rays as well FINDINGS: Right-sided hydropneumothorax. Moderate pneumothorax component similar to the prior CT scan. Adjacent right lung base opacity. Left lung is hyperinflated. Normal cardiopericardial silhouette. No edema. IMPRESSION: Known right-sided hydropneumothorax. Hyperinflation. Please correlate with prior PET-CT scan of 04/06/2019 Electronically Signed   By: Karen Kays M.D.   On: 04/10/2023 17:28   NM PET Image Initial (PI) Skull Base To Thigh  Result Date: 04/10/2023 CLINICAL DATA:  Initial treatment strategy for non-small-cell lung cancer. Initial staging. EXAM: NUCLEAR MEDICINE PET SKULL  BASE TO THIGH TECHNIQUE: 6.5 mCi F-18 FDG was injected intravenously. Full-ring PET imaging was performed from the skull base to thigh after the radiotracer. CT data was obtained and used for attenuation correction and anatomic localization. Fasting blood glucose: 111 mg/dl COMPARISON:  16/09/9603 FINDINGS: Mediastinal blood pool activity: SUV max 2.5 Liver activity: SUV max NA NECK: No areas of abnormal hypermetabolism. Incidental CT findings: No cervical adenopathy. Right maxillary sinus mucosal thickening. Ethmoid air cell and right frontal sinus mucosal thickening. CHEST: The right middle lobe lung lesion detailed on chest CT is hypermetabolic. Example nodule of 2.8 x 2.6 cm and a S.U.V. max of 9.1 on 128/3. Ipsilateral nodal metastasis including a low right mediastinal node of 8 mm and a S.U.V. max of 5.3 on 113/3. Incidental CT findings: Persistent moderate right pleural effusion. Development of a small right-sided pneumothorax, estimated at 15-20%. Aortic and coronary artery calcification. ABDOMEN/PELVIS: Bilateral, left-greater-than-right adrenal hypermetabolism with mild thickening and nodularity bilaterally. Example on the left at a S.U.V. max of 4.4. No hepatic hypermetabolism to correspond to the hyperenhancing liver lesions on CT. Isolated gastrohepatic ligament node measures  5 mm and a S.U.V. max of 4.2 on 158/3. Incidental CT findings: Non tracer avid 3.1 cm peripherally calcified interpolar right renal mass. Abdominal aortic atherosclerosis. SKELETON: Multiple hypermetabolic, primarily lytic osseous lesions. Example within the posterior right femoral shaft proximally at a S.U.V. max of 5.7 on 252/2. Correlate cortical disruption. Involving the right acetabulum and corresponding ill-defined lucency including at a S.U.V. max of 9.4 on 224/3. Index right scapular lesion with cortical destruction involving the acromion process including at a S.U.V. max of 5.7 on 56/3. Incidental CT findings: Third and  fourth anterolateral left rib cortical destruction. IMPRESSION: 1. Right middle lobe primary bronchogenic carcinoma with mediastinal nodal, osseous, and probable upper abdominal nodal metastasis. 2. Development of a right-sided pneumothorax since 03/16/2022 with persistent moderate right pleural effusion. These results will be called to the ordering clinician or representative by the Radiologist Assistant, and communication documented in the PACS or [employer]. 3. Metastasis within the right acetabulum and proximal right femur may predispose the patient to fracture with weight-bearing. Consider orthopedic consultation. 4. Bilateral adrenal hypermetabolism with mild nodularity. Suspicious for early adrenal metastasis. 5. The right renal mass is not hypermetabolic, favored to represent a complex cyst. The liver lesions are not hypermetabolic and may represent benign entities such as hemangiomas. Recommend attention on follow-up. 6. Incidental findings, including: Sinus disease. Coronary artery atherosclerosis. Aortic Atherosclerosis (ICD10-I70.0). Electronically Signed   By: Jeronimo Greaves M.D.   On: 04/10/2023 15:37       IMPRESSION/PLAN: 1. 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 Dr. Mitzi Hansen discusses the pathology findings and reviews the nature of Stage IV lung cancer.  He reviews the rationale for systemic therapy which he is in the process of coordinating with Dr. Ellin Saba. In addition to systemic therapy, Dr. Mitzi Hansen recommends a palliative course of radiation to the right pelvis/hip including the femur, as well as to the right scapula. He may be a candidate for stereotactic radiosurgery [facility]) versus whole brain radiation. He would need a 3T MRI to potentially be a candidate for [facility] treatment.   We discussed the risks, benefits, short, and long term effects of radiotherapy, as well as the palliative intent, as well as curative to the brain if SRS is  given, and the patient is interested in proceeding. Dr. Mitzi Hansen discusses the delivery and logistics of radiotherapy and anticipates a course of 2 weeks of radiotherapy to at least the right scapula and right pelvis/hip including the femur. Written consent is obtained and placed in the chart, a copy was provided to the patient. He will simulate for the palliative treatment today. I've notified our brain oncology navigator to help coordinate a 3T MRI of his brain. We will follow up with these results when available. He is also aware of the options of whole brain radiation as well including course, side effects, and logistics pending the 3T MRI. 2.   Hypotension with tachycardia. I encouraged him to try to increase his oral intake of liquids. If he notes chest pain or shortness of breath at rest, or lower extremity edema he knows to contact Dr. Ellin Saba or our team, or go to the ER for urgent evaluation.  3. Claustrophobia. The patient will need Ativan prior to 3T MRI. A new prescription is sent to his pharmacy after reviewing the side effect profile.   In a visit lasting 60 minutes, greater than 50% of the time was spent face to face discussing the patient's condition, in preparation  for the discussion, and coordinating the patient's care.   The above documentation reflects my direct findings during this shared patient visit. Please see the separate note by Dr. Mitzi Hansen on this date for the remainder of the patient's plan of care.    Osker Mason, [facility]   **Disclaimer: This note was dictated with voice recognition software. Similar sounding words can inadvertently be transcribed and this note may contain transcription errors which may not have been corrected upon publication of note.**

## 2023-05-02 ENCOUNTER — Encounter: Payer: Self-pay | Admitting: Radiation Oncology

## 2023-05-02 ENCOUNTER — Ambulatory Visit
Admission: RE | Admit: 2023-05-02 | Discharge: 2023-05-02 | Disposition: A | Discharge status: Home / Self Care | Payer: Medicare Other | Source: Ambulatory Visit | Attending: Radiation Oncology | Admitting: Radiation Oncology

## 2023-05-02 ENCOUNTER — Other Ambulatory Visit: Payer: Self-pay | Admitting: Radiation Oncology

## 2023-05-02 ENCOUNTER — Other Ambulatory Visit: Payer: Self-pay | Admitting: Radiation Therapy

## 2023-05-02 VITALS — BP 107/73 | HR 115 | Temp 97.5°F | Resp 20 | Wt 124.0 lb

## 2023-05-02 DIAGNOSIS — C7931 Secondary malignant neoplasm of brain: Secondary | ICD-10-CM | POA: Insufficient documentation

## 2023-05-02 DIAGNOSIS — Z87891 Personal history of nicotine dependence: Secondary | ICD-10-CM | POA: Diagnosis not present

## 2023-05-02 DIAGNOSIS — C7951 Secondary malignant neoplasm of bone: Secondary | ICD-10-CM | POA: Insufficient documentation

## 2023-05-02 DIAGNOSIS — R000 Tachycardia, unspecified: Secondary | ICD-10-CM | POA: Diagnosis not present

## 2023-05-02 DIAGNOSIS — C342 Malignant neoplasm of middle lobe, bronchus or lung: Secondary | ICD-10-CM | POA: Insufficient documentation

## 2023-05-02 DIAGNOSIS — C3491 Malignant neoplasm of unspecified part of right bronchus or lung: Secondary | ICD-10-CM

## 2023-05-02 DIAGNOSIS — Z79899 Other long term (current) drug therapy: Secondary | ICD-10-CM | POA: Insufficient documentation

## 2023-05-02 DIAGNOSIS — N400 Enlarged prostate without lower urinary tract symptoms: Secondary | ICD-10-CM | POA: Insufficient documentation

## 2023-05-02 DIAGNOSIS — J90 Pleural effusion, not elsewhere classified: Secondary | ICD-10-CM | POA: Diagnosis not present

## 2023-05-02 DIAGNOSIS — Z800 Family history of malignant neoplasm of digestive organs: Secondary | ICD-10-CM | POA: Diagnosis not present

## 2023-05-02 DIAGNOSIS — I2510 Atherosclerotic heart disease of native coronary artery without angina pectoris: Secondary | ICD-10-CM | POA: Insufficient documentation

## 2023-05-02 DIAGNOSIS — I959 Hypotension, unspecified: Secondary | ICD-10-CM | POA: Diagnosis not present

## 2023-05-02 DIAGNOSIS — Z510 Encounter for antineoplastic radiation therapy: Secondary | ICD-10-CM | POA: Diagnosis not present

## 2023-05-02 DIAGNOSIS — F40240 Claustrophobia: Secondary | ICD-10-CM | POA: Insufficient documentation

## 2023-05-02 DIAGNOSIS — J439 Emphysema, unspecified: Secondary | ICD-10-CM | POA: Insufficient documentation

## 2023-05-02 DIAGNOSIS — I700 Atherosclerosis of aorta: Secondary | ICD-10-CM | POA: Insufficient documentation

## 2023-05-02 DIAGNOSIS — J449 Chronic obstructive pulmonary disease, unspecified: Secondary | ICD-10-CM | POA: Diagnosis not present

## 2023-05-02 MED ORDER — LORAZEPAM 0.5 MG PO TABS: ORAL_TABLET | ORAL | 0 refills | Status: DC

## 2023-05-02 NOTE — Addendum Note (Signed)
Encounter addended by: Ronny Bacon, PA-C on: 05/02/2023 12:58 PM  Actions taken: Level of Service modified

## 2023-05-02 NOTE — Progress Notes (Signed)
Thoracic Location of Tumor / Histology: Right Middle Lobe Lung, brain and bone metastasis.  Patient presented to his PCP with complaints of chest pain.  Chest xray obtained and showed a right pleural effusion and atelectasis.  MRI Pelvis 04/27/2023: Widespread osseous metastatic disease throughout the bony pelvis, asymmetric to the right.  Asymmetric involvement of the right superior pubic ramus with surrounding soft tissue edema and enhancement, findings which may reflect an early pathologic fracture.  Additional lesion involving the right lesser femoral trochanter is associated with cortical thinning, also at risk for pathologic fracture.  No periarticular soft tissue masses identified.  Small amount of pelvic ascites.  Bronchoscopy 04/18/2023  PET 04/06/2023: hypermetabolic activity within the right middle lobe, mediastinum including adenopathy extending into the upper abdominal chains, with osseous disease as well.  Hypermetabolic activity was seen in the right acetabulum, proximal right femur, bilateral adrenal glands, and right scapula as well as destruction involving the third and fourth anterolateral left ribs.   MRI Brain 03/30/2023:  peripherally enhancing 1.5 x 1.7 cm lesion in the left frontal lobe. Complex peripherally enhancing 1.5 cm lesion more superiorly along the high left frontal lobe near the vertex.  Approximately 5 mm peripherally enhancing lesion in the right frontal lobe.  Probable peripherally enhancing 5 mm lesion in the right occipital lobe and in the posterior left temporal lobe. Approximately 8 mm enhancing lesion in the left parietal lobe. Possible punctate lesion in the right frontal operculum and parasagittal right frontal lobe. Ill-defined enhancing lesion in the right temporal lobe. Possible punctate enhancing lesion in the superior cerebellum  CT Chest 03/17/2023: 28 cm mass in the right middle lobe with associated complete collapse of the right middle lobe and  nodularity of the right minor fissure.   Biopsies of Right Middle Lobe Lung 04/18/2023    Past/Anticipated interventions by cardiothoracic surgery, if any:   Past/Anticipated interventions by medical oncology, if any:  Dr. Ellin Saba 04/24/2023 -We will make a referral to radiation oncology for [facility].  -Recommend MRI of the pelvis to see if he needs any surgical intervention. Otherwise I would recommend palliative radiation for pain control.    Tobacco/Marijuana/Snuff/ETOH use: Former smoker, quit 2012   Ambulatory status? Walker? Wheelchair?: Ambulatory  Signs/Symptoms Weight changes, if any: He reports few pounds lost over the past month. Respiratory complaints, if any: He reports feeling winded with increased activities. Hemoptysis, if any: He reports occasional cough with clear phlegm.  Denies hemoptysis. Pain issues, if any: He reports some tightness in his chest when he sneezes.   Recent neurologic symptoms, if any:  Seizures: No Headaches: No Nausea: No Dizziness/ataxia: No Difficulty with hand coordination: No Focal numbness/weakness: Fingertip numbness on occasion. Visual deficits/changes: No Confusion/Memory deficits: None   SAFETY ISSUES: Prior radiation? No Pacemaker/ICD? No  Possible current pregnancy? N/a Is the patient on methotrexate? No  Current Complaints / other details:   -Patient voiced he would like to consider having his radiation treatments closer to home.

## 2023-05-03 DIAGNOSIS — Z510 Encounter for antineoplastic radiation therapy: Secondary | ICD-10-CM | POA: Diagnosis not present

## 2023-05-04 ENCOUNTER — Other Ambulatory Visit: Payer: Self-pay

## 2023-05-04 ENCOUNTER — Ambulatory Visit
Admission: RE | Admit: 2023-05-04 | Discharge: 2023-05-04 | Disposition: A | Discharge status: Home / Self Care | Payer: Medicare Other | Source: Ambulatory Visit | Attending: Radiation Oncology | Admitting: Radiation Oncology

## 2023-05-04 ENCOUNTER — Encounter (HOSPITAL_COMMUNITY): Payer: Self-pay

## 2023-05-04 DIAGNOSIS — Z510 Encounter for antineoplastic radiation therapy: Secondary | ICD-10-CM | POA: Diagnosis not present

## 2023-05-04 LAB — RAD ONC ARIA SESSION SUMMARY

## 2023-05-05 ENCOUNTER — Other Ambulatory Visit: Payer: Self-pay

## 2023-05-05 ENCOUNTER — Ambulatory Visit
Admission: RE | Admit: 2023-05-05 | Discharge: 2023-05-05 | Disposition: A | Discharge status: Home / Self Care | Payer: Medicare Other | Source: Ambulatory Visit | Attending: Radiation Oncology | Admitting: Radiation Oncology

## 2023-05-05 DIAGNOSIS — Z510 Encounter for antineoplastic radiation therapy: Secondary | ICD-10-CM | POA: Diagnosis not present

## 2023-05-05 LAB — RAD ONC ARIA SESSION SUMMARY

## 2023-05-09 ENCOUNTER — Other Ambulatory Visit: Payer: Self-pay

## 2023-05-09 ENCOUNTER — Ambulatory Visit
Admission: RE | Admit: 2023-05-09 | Discharge: 2023-05-09 | Disposition: A | Discharge status: Home / Self Care | Payer: Medicare Other | Source: Ambulatory Visit | Attending: Radiation Oncology | Admitting: Radiation Oncology

## 2023-05-09 ENCOUNTER — Other Ambulatory Visit: Payer: Self-pay | Admitting: Radiation Oncology

## 2023-05-09 ENCOUNTER — Other Ambulatory Visit: Payer: Self-pay | Admitting: *Deleted

## 2023-05-09 ENCOUNTER — Inpatient Hospital Stay: Payer: Medicare Other | Admitting: Hematology

## 2023-05-09 DIAGNOSIS — C7931 Secondary malignant neoplasm of brain: Secondary | ICD-10-CM

## 2023-05-09 DIAGNOSIS — Z510 Encounter for antineoplastic radiation therapy: Secondary | ICD-10-CM | POA: Diagnosis not present

## 2023-05-09 LAB — RAD ONC ARIA SESSION SUMMARY
Course Elapsed Days: 5
Plan Fractions Treated to Date: 3
Plan Fractions Treated to Date: 3
Plan Prescribed Dose Per Fraction: 3 Gy
Plan Prescribed Dose Per Fraction: 3 Gy
Plan Total Fractions Prescribed: 10
Plan Total Fractions Prescribed: 10
Plan Total Prescribed Dose: 30 Gy
Plan Total Prescribed Dose: 30 Gy
Reference Point Dosage Given to Date: 9 Gy
Reference Point Dosage Given to Date: 9 Gy
Reference Point Session Dosage Given: 3 Gy
Reference Point Session Dosage Given: 3 Gy
Session Number: 3

## 2023-05-09 MED ORDER — GADOPICLENOL 0.5 MMOL/ML IV SOLN
6.0000 mL | Freq: Once | INTRAVENOUS | Status: AC | PRN
  Administered 2023-05-09: 6 mL via INTRAVENOUS

## 2023-05-09 NOTE — Progress Notes (Signed)
[facility] [address] [place] [address]    Clinic Day:  05/10/2023  Referring physician: Gareth Morgan, MD  Patient Care Team: Gareth Morgan, MD as PCP - General (Family Medicine)   ASSESSMENT & PLAN:   Assessment: 1.  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 Sudie Bailey with a chest x-ray on 03/15/2023, with right pleural effusion. - CT chest (03/17/2023): Right middle lobe mass with complete collapse of the right middle lobe, moderate right pleural effusion, nodularity of the right minor fissure, lytic lesions of the right scapula and left third and fourth ribs.  Multiple enhancing liver lesions concerning for metastatic disease. - PET scan (04/06/2023): Right middle lobe primary bronchogenic carcinoma with mediastinal nodal, bone metastasis and probable upper abdominal nodal metastasis.  Right sided pneumothorax.  Metastasis with right acetabulum and proximal right femur.  Bilateral adrenal hypermetabolism with mild nodularity suspicious for early metastasis.  Right renal mass is not hypermetabolic.  No hepatic hypermetabolism. - Brain MRI (03/30/2023): 1.5 x 1.7 cm lesion in the left frontal lobe.  1.5 cm lesion in the high left frontal lobe.  5 mm lesion in the right frontal lobe.  Few other subcentimeter lesions, total of 10 reported. - Right pleural fluid cytology (04/11/2023): Malignant cells consistent with adenocarcinoma. - RML lung FNA (04/18/2023): Lung adenocarcinoma. - Guardant360: EGFR G719C, EGFR S768I.  MSI-high not detected. - [id number] tissue next (04/26/2023): PD-L1 22 C3 TPS<1%.  EGFR G719C, EGFR S768I, T p53, EGFR amplification, PIK3CA amplification, BRAF amplification, RB1 splice site SNV.  MSI-high not detected. - Afatinib 30 mg daily started on   2.  Social/family history: - He lives by himself.  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  He had exposure to chemicals.  He quit smoking in 2012.  Smoked 1 pack/day starting age 45. - Maternal grandmother and maternal uncle had colon cancers.    Plan: 1.  Metastatic adenocarcinoma of the lung to the brain, bones and adrenals: - We reviewed Guardant360 tissue next results. - I have reviewed literature on both medications including afatinib and was Nilotinib.  Most of the data is with afatinib and these uncommon EGFR mutations. - Recommend starting afatinib 30 mg daily.  If he tolerates well, will increase to 40 mg daily.  Discussed side effects in detail including diarrhea, skin rashes, LFT abnormalities, hypertension among others. - He will start taking it once he receives the shipment.  RTC 3 weeks for follow-up.   2.  Brain metastasis: - MRI 3T SRS protocol showed mild interval increase in size and multiple new small metastasis.  He has follow-up with radiation oncology later today to discuss SRS versus WBRT.  He is asymptomatic.   3.  Right hip pain: - He started radiation therapy to the right shoulder and right hip last week. - He has right hip pain and takes Advil 200 mg 2-3 times daily.  He does not want narcotics as they made him nauseous.    Orders Placed This Encounter  Procedures   CBC with Differential    Standing Status:   Future    Standing Expiration Date:   05/09/2024   Comprehensive metabolic panel    Standing Status:   Future    Standing Expiration Date:   05/09/2024   Magnesium  Standing Status:   Future    Standing Expiration Date:   05/09/2024      I,Katie Daubenspeck,acting as a scribe for [name], MD.,have documented all relevant documentation on the behalf of [name], MD,as directed by  [name], MD while in the presence of [name], MD.   I, [name] MD, have reviewed the above  documentation for accuracy and completeness, and I agree with the above.   [name], MD   5/29/20246:00 PM  CHIEF COMPLAINT:   Diagnosis: metastatic non-small cell adenocarcinoma    Cancer Staging  Lung cancer [facility]) Staging form: Lung, AJCC 8th Edition - Clinical stage from 04/24/2023: Stage IVB (cT1c, cN2, pM1c) - Signed by [name], MD on 05/10/2023    Prior Therapy: none  Current Therapy:  Brain radiation, EGFR TKI    HISTORY OF PRESENT ILLNESS:   Oncology History  Lung cancer (HCC)  04/10/2023 Initial Diagnosis   Lung cancer (HCC)   04/24/2023 Cancer Staging   Staging form: Lung, AJCC 8th Edition - Clinical stage from 04/24/2023: Stage IVB (cT1c, cN2, pM1c) - Signed by [name], MD on 05/10/2023 Histopathologic type: Adenocarcinoma, NOS Stage prefix: Initial diagnosis      INTERVAL HISTORY:   Theodore [name] is a 67 y.o. male presenting to clinic today for follow up of metastatic non-small cell adenocarcinoma. He was last seen by me on 04/24/23 in consultation.  Since his last visit, he underwent pelvis MRI on 04/27/23 showing: widespread osseous metastatic disease throughout bony pelvis, asymmetric to right; risk for pathologic fracture at right superior pubic ramus and right lesser femoral trochanter; no periarticular soft tissue masses.  He also met with Dr. Mitzi Hansen on 05/02/23 to discuss potential brain irradiation. He had CT simulation the same day and began treatment on 05/04/23.  He also underwent treatment planning brain MRI (3T SRS Protocol) yesterday, 05/09/23, showing: mild interval increase in size of previously seen metastases; multiple new small metastases, some of which were likely present on prior but better characterized on this study.  Today, he states that he is doing well overall. His appetite level is at 100%. His energy level is at 60%.  PAST MEDICAL HISTORY:   Past Medical History: Past Medical History:  Diagnosis Date    COPD (chronic obstructive pulmonary disease) (HCC)     Surgical History: Past Surgical History:  Procedure Laterality Date   BRONCHIAL BIOPSY  04/18/2023   Procedure: BRONCHIAL BIOPSIES;  Surgeon: Josephine Igo, DO;  Location: [facility] ENDOSCOPY;  Service: Pulmonary;;   BRONCHIAL NEEDLE ASPIRATION BIOPSY  04/18/2023   Procedure: BRONCHIAL NEEDLE ASPIRATION BIOPSIES;  Surgeon: Josephine Igo, DO;  Location: [facility] ENDOSCOPY;  Service: Pulmonary;;   CHEST TUBE INSERTION Right 04/11/2023   Procedure: CHEST TUBE INSERTION;  Surgeon: Omar Person, MD;  [facility];  Service: Pulmonary;  Laterality: Right;  fentanyl IV only   excision of cyst left ankle     MASS EXCISION Left 08/27/2018   Procedure: EXCISION LIPOMA LEFT BUTTOCK;  Surgeon: Franky Macho, MD;  Location: [facility] ORS;  Service: General;  Laterality: Left;    Social History: Social History   Socioeconomic History   Marital status: Married    Spouse name: Not on file   Number of children: Not on file   Years of education: Not on file   Highest education level: Not on file  Occupational History   Not on file  Tobacco Use   Smoking status: Former    Types: Cigarettes  Quit date: 2012    Years since quitting: 12.4   Smokeless tobacco: Never  Vaping Use   Vaping Use: Never used  Substance and Sexual Activity   Alcohol use: Not Currently    Comment: Quit 1988   Drug use: Not Currently   Sexual activity: Yes    Birth control/protection: None  Other Topics Concern   Not on file  Social History Narrative   Not on file   Social Determinants of Health   Financial Resource Strain: Not on file  Food Insecurity: Food Insecurity Present (05/02/2023)   Hunger Vital Sign    Worried About Running Out of Food in the Last Year: Never true    Ran Out of Food in the Last Year: Sometimes true  Transportation Needs: No Transportation Needs (05/02/2023)   PRAPARE - Administrator, Civil Service (Medical): No    Lack  of Transportation (Non-Medical): No  Physical Activity: Not on file  Stress: Not on file  Social Connections: Not on file  Intimate Partner Violence: Not At Risk (05/02/2023)   Humiliation, Afraid, Rape, and Kick questionnaire    Fear of Current or Ex-Partner: No    Emotionally Abused: No    Physically Abused: No    Sexually Abused: No    Family History: Family History  Problem Relation Age of Onset   Colon cancer Maternal Grandmother    Colon cancer Maternal Uncle     Current Medications:  Current Outpatient Medications:    acetaminophen (TYLENOL) 325 MG tablet, Take 2 tablets (650 mg total) by mouth every 6 (six) hours as needed for mild pain, moderate pain, headache or fever (or Fever >/= 101)., Disp: , Rfl:    afatinib dimaleate (GILOTRIF) 30 MG tablet, Take 1 tablet (30 mg total) by mouth daily. Take on an empty stomach 1hr before or 2hrs after meals., Disp: 30 tablet, Rfl: 0   albuterol (PROVENTIL HFA;VENTOLIN HFA) 108 (90 Base) MCG/ACT inhaler, Inhale 2 puffs into the lungs every 4 (four) hours as needed for wheezing or shortness of breath., Disp: , Rfl:    folic acid (FOLVITE) 1 MG tablet, Take 1 tablet (1 mg total) by mouth daily., Disp: 30 tablet, Rfl: 1   ibuprofen (ADVIL) 200 MG tablet, Take 400 mg by mouth every 6 (six) hours as needed for moderate pain., Disp: , Rfl:    LORazepam (ATIVAN) 0.5 MG tablet, 1 tab po 30 minutes prior to radiation or MRI scans, Disp: 30 tablet, Rfl: 0   SYMBICORT 160-4.5 MCG/ACT inhaler, Inhale 1 puff into the lungs 2 (two) times daily as needed., Disp: , Rfl: 11   Allergies: No Active Allergies   REVIEW OF SYSTEMS:   Review of Systems  Constitutional:  Negative for chills, fatigue and fever.  HENT:   Negative for lump/mass, mouth sores, nosebleeds, sore throat and trouble swallowing.   Eyes:  Negative for eye problems.  Respiratory:  Positive for cough and shortness of breath.   Cardiovascular:  Negative for chest pain, leg swelling  and palpitations.  Gastrointestinal:  Negative for abdominal pain, constipation, diarrhea, nausea and vomiting.  Genitourinary:  Positive for frequency. Negative for bladder incontinence, difficulty urinating, dysuria, hematuria and nocturia.   Musculoskeletal:  Positive for arthralgias. Negative for back pain, flank pain, myalgias and neck pain.  Skin:  Negative for itching and rash.  Neurological:  Negative for dizziness, headaches and numbness.  Hematological:  Does not bruise/bleed easily.  Psychiatric/Behavioral:  Negative for depression, sleep disturbance and  suicidal ideas. The patient is not nervous/anxious.   All other systems reviewed and are negative.    VITALS:   Blood pressure 113/80, pulse (!) 114, temperature 97.9 F (36.6 C), temperature source Oral, resp. rate 19, height 5\' 9"  (1.753 m), weight 122 lb 8 oz (55.6 kg), SpO2 97 %.  Wt Readings from Last 3 Encounters:  05/10/23 122 lb 8 oz (55.6 kg)  05/02/23 124 lb (56.2 kg)  04/24/23 124 lb (56.2 kg)    Body mass index is 18.09 kg/m.  Performance status (ECOG): 1 - Symptomatic but completely ambulatory  PHYSICAL EXAM:   Physical Exam Vitals and nursing note reviewed. Exam conducted with a chaperone present.  Constitutional:      Appearance: Normal appearance.  Cardiovascular:     Rate and Rhythm: Normal rate and regular rhythm.     Pulses: Normal pulses.     Heart sounds: Normal heart sounds.  Pulmonary:     Effort: Pulmonary effort is normal.     Breath sounds: Normal breath sounds.  Abdominal:     Palpations: Abdomen is soft. There is no hepatomegaly, splenomegaly or mass.     Tenderness: There is no abdominal tenderness.  Musculoskeletal:     Right lower leg: No edema.     Left lower leg: No edema.  Lymphadenopathy:     Cervical: No cervical adenopathy.     Right cervical: No superficial, deep or posterior cervical adenopathy.    Left cervical: No superficial, deep or posterior cervical adenopathy.      Upper Body:     Right upper body: No supraclavicular or axillary adenopathy.     Left upper body: No supraclavicular or axillary adenopathy.  Neurological:     General: No focal deficit present.     Mental Status: He is alert and oriented to person, place, and time.  Psychiatric:        Mood and Affect: Mood normal.        Behavior: Behavior normal.     LABS:      Latest Ref Rng & Units 04/19/2023    8:12 AM 04/11/2023    4:10 AM 04/10/2023    5:42 PM  CBC  WBC 4.0 - 10.5 K/uL 16.4  12.6  14.1   Hemoglobin 13.0 - 17.0 g/dL 16.1  09.6  04.5   Hematocrit 39.0 - 52.0 % 43.1  42.2  47.2   Platelets 150 - 400 K/uL 379  339  358       Latest Ref Rng & Units 04/19/2023    8:06 AM 04/12/2023    4:38 AM 04/11/2023    4:10 AM  CMP  Glucose 70 - 99 mg/dL 99   96   BUN 8 - 23 mg/dL 14   15   Creatinine 4.09 - 1.24 mg/dL 8.11   9.14   Sodium 782 - 145 mmol/L 130   135   Potassium 3.5 - 5.1 mmol/L 4.0   3.7   Chloride 98 - 111 mmol/L 96   100   CO2 22 - 32 mmol/L 21   26   Calcium 8.9 - 10.3 mg/dL 9.1   8.9   Total Protein 6.5 - 8.1 g/dL  6.5  6.3   Total Bilirubin 0.3 - 1.2 mg/dL   0.6   Alkaline Phos 38 - 126 U/L   115   AST 15 - 41 U/L   17   ALT 0 - 44 U/L   23  No results found for: "CEA1", "CEA" / No results found for: "CEA1", "CEA" No results found for: "PSA1" No results found for: "ZOX096" No results found for: "CAN125"  No results found for: "TOTALPROTELP", "ALBUMINELP", "A1GS", "A2GS", "BETS", "BETA2SER", "GAMS", "MSPIKE", "SPEI" No results found for: "TIBC", "FERRITIN", "IRONPCTSAT" Lab Results  Component Value Date   LDH 197 (H) 04/12/2023     STUDIES:   MR Brain W Wo Contrast  Result Date: 05/09/2023 CLINICAL DATA:  Metastatic disease evaluation 3T SRS Protocol for radiation treatment planning EXAM: MRI HEAD WITHOUT AND WITH CONTRAST TECHNIQUE: Multiplanar, multiecho pulse sequences of the brain and surrounding structures were obtained without and with  intravenous contrast. CONTRAST:  5mL Vueway COMPARISON:  MRI head March 30, 2023. FINDINGS: Brain: Similar versus slightly increased size of a left frontal lobe enhancing lesion, measuring 17 x 20 mm on series 14, image 111. similar versus slightly increased size of a 15 mm lesion more superiorly in the left frontal lobe (series 14, image 126). Similar versus slightly increased size of a 6 mm lesion in the right frontal lobe (series 14, image 124). Similar ill-defined lesion in the high right posterior frontal lobe (series 13, image 131). Mild interval increase in size of an inferior and posterior right frontal lobe lesion (series 13, image 99). Suspect slight interval increase in size/conspicuity of an enhancing lesion the right frontal lobe (series 13, image 86). Mildly increased size of a right occipital lesion (series 13, image 64). Mildly increased size of a lesion in the posterior left temporal lobe (series 13, image 66). Interval increase in size of a subcentimeter lesion in the posterior left parietal lobe (series 13, image 97). New punctate enhancing lesion in the lateral right temporal lobe (series 13, image 74). Increased conspicuity of additional subcentimeter lesions in the right parietal lobe (series 13, 25) and right temporal lobe (series 13, image 90), likely present on prior. New punctate lesion in the posterior high left frontal lobe (series 14, image 118). New punctate focus of enhancement in the inferior right frontal lobe (series 13, image 115). New punctate enhancing lesion in the right basal ganglia (series 13, image 88). New subcentimeter enhancing lesion in the left frontoparietal region (series 13, image 111). New punctate enhancing lesion in the left occipital lobe (series 13, image 67). No evidence of acute infarct, midline shift, acute hemorrhage, or hydrocephalus. Vascular: Major arterial flow voids are maintained skull base. Skull and upper cervical spine: Normal marrow signal.  Sinuses/Orbits: Negative. IMPRESSION: Mild interval increase in size of the previously seen metastases and multiple new small metastases, detailed above. Some of these lesions likely were present on the prior but better characterized on this 3T SRS study. Electronically Signed   By: Feliberto Harts M.D.   On: 05/09/2023 15:09   MR PELVIS W WO CONTRAST  Result Date: 04/27/2023 CLINICAL DATA:  Bone lesion, pelvis, incidental. Metastatic disease evaluation with pain. Non-small cell lung cancer. EXAM: MRI PELVIS WITHOUT AND WITH CONTRAST TECHNIQUE: Multiplanar multisequence MR imaging of the pelvis was performed both before and after administration of intravenous contrast. CONTRAST:  5.12mL GADAVIST GADOBUTROL 1 MMOL/ML IV SOLN COMPARISON:  PET-CT 04/06/2023. FINDINGS: Urinary Tract: The visualized distal ureters and bladder appear unremarkable. Bowel: No bowel wall thickening, distention or surrounding inflammation identified within the pelvis. Vascular/Lymphatic: No enlarged pelvic lymph nodes identified. No significant vascular findings. Reproductive: The prostate gland is mildly enlarged with central heterogeneity, likely due to BPH. Other: Small amount of pelvic ascites. Musculoskeletal: As demonstrated on  recent PET-CT, there is widespread osseous metastatic disease throughout the bony pelvis which is asymmetric to the right. These lesions demonstrate decreased T1 signal, increased T2 signal and heterogeneous enhancement following contrast. There are multiple lesions involving the right iliac bone, including the superior acetabulum. There is asymmetric involvement of the right superior pubic ramus with surrounding soft tissue edema and enhancement, findings which may reflect an early pathologic fracture. A lesion involving the right lesser femoral trochanter measures up to 4.2 cm on image 17/16 and is associated with cortical thinning, also at risk for pathologic fracture. A single small metastasis is noted  superiorly in the left iliac wing. There is multilevel spondylosis in the visualized lower lumbar spine. No periarticular soft tissue masses are identified. IMPRESSION: 1. Widespread osseous metastatic disease throughout the bony pelvis, asymmetric to the right. 2. Asymmetric involvement of the right superior pubic ramus with surrounding soft tissue edema and enhancement, findings which may reflect an early pathologic fracture. 3. Additional lesion involving the right lesser femoral trochanter is associated with cortical thinning, also at risk for pathologic fracture. 4. No periarticular soft tissue masses identified. 5. Small amount of pelvic ascites. Electronically Signed   By: Carey Bullocks M.D.   On: 04/27/2023 18:14   DG CHEST PORT 1 VIEW  Result Date: 04/19/2023 CLINICAL DATA:  Chest tube removal. EXAM: PORTABLE CHEST 1 VIEW COMPARISON:  Radiograph earlier today. CT yesterday FINDINGS: Right pigtail catheter has been removed. There is no pneumothorax. Small right pleural effusion persists. The exam is otherwise unchanged. IMPRESSION: Removal of right chest tube. No pneumothorax. Small right pleural effusion persists. Electronically Signed   By: Narda Rutherford M.D.   On: 04/19/2023 14:50   DG CHEST PORT 1 VIEW  Result Date: 04/19/2023 CLINICAL DATA:  Hypoxemia EXAM: PORTABLE CHEST 1 VIEW COMPARISON:  04/18/2023 FINDINGS: The lungs are hyperinflated in keeping with changes of underlying COPD. The lungs are clear. Right basilar pigtail chest tube is in place laterally. Small right pleural effusion is present. No pneumothorax. Cardiac size within normal limits. Pulmonary vascularity is normal. No acute bone abnormality. IMPRESSION: 1. Right basilar pigtail chest tube in place. Small right pleural effusion. No pneumothorax. Electronically Signed   By: Helyn Numbers M.D.   On: 04/19/2023 02:46   DG Chest Port 1 View  Result Date: 04/18/2023 CLINICAL DATA:  Status post bronchoscopy. EXAM: PORTABLE CHEST  1 VIEW COMPARISON:  Same day. FINDINGS: The heart size and mediastinal contours are within normal limits. Stable appearance of partial atelectasis of right middle lobe. Stable mild scarring or subsegmental atelectasis is noted in inferior portion of right upper lobe. No pneumothorax is noted. Right basilar chest tube is unchanged in position. The visualized skeletal structures are unremarkable. IMPRESSION: No pneumothorax status post bronchoscopy. Stable partial atelectasis of right middle lobe as well as mild scarring or subsegmental atelectasis in inferior portion of right upper lobe. Electronically Signed   By: Lupita Raider M.D.   On: 04/18/2023 14:57   DG C-ARM BRONCHOSCOPY  Result Date: 04/18/2023 C-ARM BRONCHOSCOPY: Fluoroscopy was utilized by the requesting physician.  No radiographic interpretation.   DG CHEST PORT 1 VIEW  Result Date: 04/18/2023 CLINICAL DATA:  Pleural effusion. EXAM: PORTABLE CHEST 1 VIEW COMPARISON:  Radiograph yesterday. Blunt chest CT available at time of radiograph interpretation. FINDINGS: Right pigtail catheter is coiled at the right lung base. Small right pleural effusion. No definite pneumothorax. Ill-defined opacities in the right mid lower lung zone, subsequently assessed on chest CT. The  heart is normal in size with stable mediastinal contours. Background hyperinflation. IMPRESSION: 1. Right pigtail catheter coiled at the right lung base. Small right pleural effusion. No definite pneumothorax. 2. Ill-defined opacities in the right mid lower lung zone, subsequently assessed on chest CT. Electronically Signed   By: Narda Rutherford M.D.   On: 04/18/2023 12:06   CT Super D Chest Wo Contrast  Result Date: 04/18/2023 CLINICAL DATA:  Progressive shortness of breath. New diagnosed of lung cancer. Recent pneumothorax. * Tracking Code: BO * EXAM: CT CHEST WITHOUT CONTRAST TECHNIQUE: Multidetector CT imaging of the chest was performed using thin slice collimation for  electromagnetic bronchoscopy planning purposes, without intravenous contrast. RADIATION DOSE REDUCTION: This exam was performed according to the departmental dose-optimization program which includes automated exposure control, adjustment of the mA and/or kV according to patient size and/or use of iterative reconstruction technique. COMPARISON:  04/06/2023 FINDINGS: Cardiovascular: Normal heart size. Aortic atherosclerosis and coronary artery calcifications. No pericardial effusion. Mediastinum/Nodes: Thyroid gland, trachea, and esophagus are unremarkable. No enlarged mediastinal or axillary lymph nodes. The hilar lymph nodes are suboptimally evaluated due to lack of IV contrast. Lungs/Pleura: Percutaneous pigtail drainage catheter is identified overlying the right anterior lung base. The previous right pneumothorax has resolved in the interval. Small right pleural effusion has decreased in volume compared with the previous exam. Tracer avid lesion within the perihilar right lung is again seen measuring approximate 2.8 by 2.3 cm, image 115/3. Similar to previous exam. There is associated postobstructive atelectasis of the entire right middle lobe. Similar appearance of pleural nodularity along the right major and minor fissure concerning for lymphangitic spread of disease. Upper Abdomen: No acute abnormality. Unchanged, non FDG avid enhancing lesion and right lobe of liver measuring 1.1 cm. Favor benign hemangioma. Unchanged, exophytic right renal mass with peripheral calcification. No FDG uptake noted within this lesion on the previous PET-CT which is favored to represent a benign complex cyst. No follow-up imaging recommended. Musculoskeletal: Lytic lesion involving the right scapula is again seen measuring 3.3 cm on today's study, image 11/3. Previously 3 cm. Lytic lesion within the head of the left clavicle measures 1 cm, image 26/3. Previously this measured the same. Unchanged lytic lesion involving the  anterolateral aspect of the left third rib and fourth rib, image 47/3 and image 56/3. IMPRESSION: 1. Percutaneous pigtail drainage catheter is identified overlying the right anterior lung base. The previous right pneumothorax has resolved in the interval. 2. Small right pleural effusion has decreased in volume compared with the previous exam. 3. Similar appearance of perihilar right lung mass with associated postobstructive atelectasis of the entire right middle lobe. 4. Unchanged appearance of pleural nodularity along the right major and minor fissure concerning for lymphangitic spread of disease. 5. Stable appearance of lytic lesions involving the right scapula, head of left clavicle, and left third and fourth ribs. 6. Aortic Atherosclerosis (ICD10-I70.0) and Emphysema (ICD10-J43.9). Electronically Signed   By: Signa Kell M.D.   On: 04/18/2023 08:34   DG CHEST PORT 1 VIEW  Result Date: 04/17/2023 CLINICAL DATA:  Chest tube EXAM: PORTABLE CHEST 1 VIEW COMPARISON:  Portable exam 0533 hours compared to 04/15/2023 FINDINGS: Pigtail drainage catheter at LEFT lung base versus RIGHT upper quadrant. Normal heart size, mediastinal contours, and pulmonary vascularity. Atherosclerotic calcification aorta. Increased opacity at medial RIGHT lung base could represent atelectasis, loculated fluid, no mass at this site by recent PET-CT. Lungs appear emphysematous with skin folds projecting over RIGHT upper lobe on 1. Minimal subsegmental  atelectasis RIGHT upper lobe. No additional infiltrate or effusion. Trace RIGHT apex pneumothorax. IMPRESSION: Trace RIGHT apex pneumothorax decreased from 04/15/2023. Electronically Signed   By: Ulyses Southward M.D.   On: 04/17/2023 12:54   DG CHEST PORT 1 VIEW  Result Date: 04/15/2023 CLINICAL DATA:  Follow-up right pneumothorax. EXAM: PORTABLE CHEST 1 VIEW COMPARISON:  04/14/2023 FINDINGS: Small approximately 10% right apical pneumothorax shows no significant change. Pleural pigtail  catheter again seen along the right hemidiaphragm. No evidence of pulmonary infiltrate or pleural fluid. Heart size is normal. IMPRESSION: No significant change in small right apical pneumothorax. Electronically Signed   By: Danae Orleans M.D.   On: 04/15/2023 10:10   DG CHEST PORT 1 VIEW  Result Date: 04/14/2023 CLINICAL DATA:  Chest tube EXAM: PORTABLE CHEST 1 VIEW COMPARISON:  Portable exam 0611 hours compared to 04/13/2023 FINDINGS: Pigtail thoracostomy tube at inferior RIGHT hemithorax. Normal heart size, mediastinal contours, and pulmonary vascularity. Atherosclerotic calcification aorta. Emphysematous changes consistent with COPD. Small residual RIGHT apex pneumothorax. Remaining lungs clear. Skin folds project over LEFT upper lobe. Osseous structures unremarkable. Calcified mass RIGHT upper quadrant 3.1 cm diameter, known renal lesion by prior PET-CT. IMPRESSION: COPD changes with small residual RIGHT apex pneumothorax. Aortic Atherosclerosis (ICD10-I70.0) and Emphysema (ICD10-J43.9). Electronically Signed   By: Ulyses Southward M.D.   On: 04/14/2023 08:40   DG CHEST PORT 1 VIEW  Result Date: 04/13/2023 CLINICAL DATA:  Chest tube. EXAM: PORTABLE CHEST 1 VIEW COMPARISON:  Apr 12, 2023. FINDINGS: The heart size and mediastinal contours are within normal limits. Stable right basilar chest tube is noted. Small right apical pneumothorax is noted and unchanged. No significant pulmonary consolidative process is noted. The visualized skeletal structures are unremarkable. IMPRESSION: Stable right-sided chest tube with stable small right apical pneumothorax. Electronically Signed   By: Lupita Raider M.D.   On: 04/13/2023 08:11   DG CHEST PORT 1 VIEW  Result Date: 04/12/2023 CLINICAL DATA:  16109 Hydropneumothorax 60454 EXAM: PORTABLE CHEST 1 VIEW COMPARISON:  04/11/2023 FINDINGS: Right basilar chest tube remains in place. Similar appearance with potential tube kinking at the skin entry site. Persistent right  hydropneumothorax. Air component estimated at 10-15%. Moderate-large layering fluid component. Persistent but slightly improving right basilar airspace opacity. Normal heart size. No abnormal shift of the heart or mediastinum. Left lung is clear. No left-sided pleural effusion or pneumothorax. IMPRESSION: 1. Persistent right hydropneumothorax with right basilar chest tube in place. Similar appearance with potential tube kinking at the skin entry site. 2. Persistent but slightly improving right basilar airspace opacity. Electronically Signed   By: Duanne Guess D.O.   On: 04/12/2023 08:18   DG CHEST PORT 1 VIEW  Result Date: 04/11/2023 CLINICAL DATA:  Left pneumothorax and pleural effusion, chest tube placement EXAM: PORTABLE CHEST 1 VIEW COMPARISON:  04/11/2023 at 8:03 a.m. FINDINGS: Interval placement of a pigtail pleural drainage catheter along the right lung base. This might possibly be partially kinked at the cutaneous margin where it seems to make an abrupt turn, although this may be projectional. Persistent 10% right pneumothorax. Persistent large right pleural fluid collection. Aside for mild basilar scarring the left lung remains clear. Atherosclerotic calcification of the aortic arch. Heart size within normal limits. IMPRESSION: 1. Interval placement of a pigtail pleural drainage catheter along the right lung base. Cannot exclude kinking at the skin surface, visual inspection suggested. Persistent 10% right pneumothorax and persistent large right pleural fluid collection. Electronically Signed   By: Annitta Needs.D.  On: 04/11/2023 14:38   DG CHEST PORT 1 VIEW  Result Date: 04/11/2023 CLINICAL DATA:  Hydropneumothorax EXAM: PORTABLE CHEST 1 VIEW COMPARISON:  04/10/2023, 04/06/2023 FINDINGS: Single frontal view of the chest demonstrates a persistent right-sided hydropneumothorax, with no significant change in the gas component since prior exam. Slight increase in fluid component and right  basilar consolidation since prior exam. Left chest is clear. Cardiac silhouette is stable. No acute fracture. IMPRESSION: 1. Persistent right-sided hydropneumothorax. Stable gas component, with slight increase in fluid component and right basilar consolidation since prior exam. Please refer to recent PET-CT describing underlying right middle lobe bronchogenic carcinoma, as well as nodal and bony metastases. Electronically Signed   By: Sharlet Salina M.D.   On: 04/11/2023 08:30

## 2023-05-10 ENCOUNTER — Inpatient Hospital Stay (HOSPITAL_BASED_OUTPATIENT_CLINIC_OR_DEPARTMENT_OTHER): Payer: Medicare Other | Admitting: Hematology

## 2023-05-10 ENCOUNTER — Ambulatory Visit
Admission: RE | Admit: 2023-05-10 | Discharge: 2023-05-10 | Disposition: A | Discharge status: Home / Self Care | Payer: Medicare Other | Source: Ambulatory Visit | Attending: Radiation Oncology | Admitting: Radiation Oncology

## 2023-05-10 ENCOUNTER — Other Ambulatory Visit: Payer: Self-pay

## 2023-05-10 ENCOUNTER — Telehealth: Payer: Self-pay

## 2023-05-10 ENCOUNTER — Telehealth: Payer: Self-pay | Admitting: Pharmacist

## 2023-05-10 ENCOUNTER — Other Ambulatory Visit (HOSPITAL_COMMUNITY): Payer: Self-pay

## 2023-05-10 VITALS — BP 113/80 | HR 114 | Temp 97.9°F | Resp 19 | Ht 69.0 in | Wt 122.5 lb

## 2023-05-10 DIAGNOSIS — C342 Malignant neoplasm of middle lobe, bronchus or lung: Secondary | ICD-10-CM

## 2023-05-10 DIAGNOSIS — Z510 Encounter for antineoplastic radiation therapy: Secondary | ICD-10-CM | POA: Diagnosis not present

## 2023-05-10 LAB — RAD ONC ARIA SESSION SUMMARY
Course Elapsed Days: 6
Plan Fractions Treated to Date: 4
Plan Fractions Treated to Date: 4
Plan Prescribed Dose Per Fraction: 3 Gy
Plan Prescribed Dose Per Fraction: 3 Gy
Plan Total Fractions Prescribed: 10
Plan Total Fractions Prescribed: 10
Plan Total Prescribed Dose: 30 Gy
Plan Total Prescribed Dose: 30 Gy
Reference Point Dosage Given to Date: 12 Gy
Reference Point Dosage Given to Date: 12 Gy
Reference Point Session Dosage Given: 3 Gy
Reference Point Session Dosage Given: 3 Gy
Session Number: 4

## 2023-05-10 MED ORDER — AFATINIB DIMALEATE 30 MG PO TABS
30.0000 mg | ORAL_TABLET | Freq: Every day | ORAL | 0 refills | Status: DC
Start: 2023-05-10 — End: 2023-06-02
  Filled 2023-05-10 – 2023-05-16 (×2): qty 30, 30d supply, fill #0

## 2023-05-10 NOTE — Telephone Encounter (Signed)
Oral Oncology Patient Advocate Encounter   Began application for assistance for Gilotrif through [employer] Cares Patient Assistance Program.   Application will be submitted upon completion of necessary supporting documentation.   [employer] Cares' phone number [phone].   I will continue to check the status until final determination.    Ardeen Fillers, CPhT Oncology Pharmacy Patient Advocate  [facility]  [phone] (phone) [phone] (fax) 05/10/2023 10:41 AM

## 2023-05-10 NOTE — Telephone Encounter (Signed)
Received MD signature on application. I will submit once I have patient signatures in hand.   Ardeen Fillers, CPhT Oncology Pharmacy Patient Advocate  [facility]  [phone] (phone) [phone] (fax) 05/10/2023 4:05 PM

## 2023-05-10 NOTE — Telephone Encounter (Addendum)
Oral Oncology Patient Advocate Encounter  Prior Authorization for [place] has been approved.    PA# [id number]  Effective dates: 05/10/23 through 12/12/23  Patients co-pay is $3,010.24.   PAP initiated. See additional encounter.    Ardeen Fillers, CPhT Oncology Pharmacy Patient Advocate  [facility]  [phone] (phone) [phone] (fax) 05/10/2023 10:33 AM

## 2023-05-10 NOTE — Patient Instructions (Addendum)
[facility] Discharge Instructions   You were seen and examined today by Dr. Ellin Saba.  He discussed with you the results of the special testing that was sent on your biopsy. It shows you have a mutation that would benefit from being treated with a pill called afatinib. The prescription will be sent to a specialty pharmacy. They will contact you with information regarding the drug and instructions regarding delivery. This medication will be delivered to your home.   Please make sure you have Imodium on hand at home as this medication is likely to cause you some diarrhea. Please take Imodium as instructed on the hand out you were given.   Return as scheduled.    Thank you for choosing [facility] at [facility] to provide your oncology and hematology care.  To afford each patient quality time with our provider, please arrive at least 15 minutes before your scheduled appointment time.   If you have a lab appointment with the Cancer Center please come in thru the Main Entrance and check in at the main information desk.  You need to re-schedule your appointment should you arrive 10 or more minutes late.  We strive to give you quality time with our providers, and arriving late affects you and other patients whose appointments are after yours.  Also, if you no show three or more times for appointments you may be dismissed from the clinic at the providers discretion.     Again, thank you for choosing [facility].  Our hope is that these requests will decrease the amount of time that you wait before being seen by our physicians.       _____________________________________________________________  Should you have questions after your visit to [facility], please contact our office at [phone] and follow the prompts.  Our office hours are 8:00 a.m. and 4:30 p.m. Monday - Friday.  Please note that voicemails left  after 4:00 p.m. may not be returned until the following business day.  We are closed weekends and major holidays.  You do have access to a nurse 24-7, just call the main number to the clinic [phone] and do not press any options, hold on the line and a nurse will answer the phone.    For prescription refill requests, have your pharmacy contact our office and allow 72 hours.    Due to Covid, you will need to wear a mask upon entering the hospital. If you do not have a mask, a mask will be given to you at the Main Entrance upon arrival. For doctor visits, patients may have 1 support person age 37 or older with them. For treatment visits, patients can not have anyone with them due to social distancing guidelines and our immunocompromised population.

## 2023-05-10 NOTE — Telephone Encounter (Signed)
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 Planned duration until disease progression or unacceptable drug toxicity.  BMP from 04/19/23 and CMP from 04/11/23 assessed, no relevant lab abnormalities. Prescription dose and frequency assessed.   Current medication list in Epic reviewed, no DDIs with afatinib identified.  Evaluated chart and no patient barriers to medication adherence identified.   Prescription has been e-scribed to the [facility] for benefits analysis and approval.  Oral Oncology Clinic will continue to follow for insurance authorization, copayment issues, initial counseling and start date.   Remi Haggard, PharmD, BCPS, BCOP, CPP Hematology/Oncology Clinical Pharmacist Practitioner [place]/DB/AP Oral Chemotherapy Navigation Clinic [phone]  05/10/2023 10:57 AM

## 2023-05-10 NOTE — Telephone Encounter (Signed)
Oral Oncology Patient Advocate Encounter  New authorization   Received notification that prior authorization for Gilotrif is required.   PA submitted on 05/10/23  Key [id number]  Status is pending     Ardeen Fillers, CPhT Oncology Pharmacy Patient Advocate  [facility]  [phone] (phone) [phone] (fax) 05/10/2023 8:59 AM

## 2023-05-10 NOTE — Telephone Encounter (Signed)
LM for pt to review MRI. I will try him again tomorrow by phone or in the building when here for treatment.

## 2023-05-11 ENCOUNTER — Ambulatory Visit
Admission: RE | Admit: 2023-05-11 | Discharge: 2023-05-11 | Disposition: A | Discharge status: Home / Self Care | Payer: Medicare Other | Source: Ambulatory Visit | Attending: Radiation Oncology | Admitting: Radiation Oncology

## 2023-05-11 ENCOUNTER — Other Ambulatory Visit: Payer: Self-pay

## 2023-05-11 DIAGNOSIS — Z510 Encounter for antineoplastic radiation therapy: Secondary | ICD-10-CM | POA: Diagnosis not present

## 2023-05-11 DIAGNOSIS — C3491 Malignant neoplasm of unspecified part of right bronchus or lung: Secondary | ICD-10-CM

## 2023-05-11 LAB — RAD ONC ARIA SESSION SUMMARY
Course Elapsed Days: 7
Plan Fractions Treated to Date: 5
Plan Fractions Treated to Date: 5
Plan Prescribed Dose Per Fraction: 3 Gy
Plan Prescribed Dose Per Fraction: 3 Gy
Plan Total Fractions Prescribed: 10
Plan Total Fractions Prescribed: 10
Plan Total Prescribed Dose: 30 Gy
Plan Total Prescribed Dose: 30 Gy
Reference Point Dosage Given to Date: 15 Gy
Reference Point Dosage Given to Date: 15 Gy
Reference Point Session Dosage Given: 3 Gy
Reference Point Session Dosage Given: 3 Gy
Session Number: 5

## 2023-05-11 NOTE — Progress Notes (Addendum)
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  He has EGFR amplification, and will start daily afatinib in the next few days.  He is currently receiving palliative radiation to his right pelvis/hip including the femur and right scapula.  He was found to have multiple findings in his brain consistent with metastatic disease and a 3T MRI scan was performed to determine if he would be a candidate for SRS.  The procedure was performed on 05/09/2023 and noted approximately 16 lesions.  Dr. Mitzi Hansen has recommended whole brain radiation and I saw him at the treatment machine today to discuss his MRI as we could not reach each other yesterday.  We discussed the rationale for whole brain radiation and discussed the risks, benefits, short and long-term effects of radiotherapy and he is interested in proceeding.  He is interested however since he has claustrophobia and needs to take Ativan prior to MRI scans and possibly prior to radiation to have his treatment closer to home and eating.  We will make a referral to Dr. Langston Masker for whole brain radiation, but he is aware that we would recommend continued surveillance in our brain oncology program such that if he needed salvage radiosurgery in the future we would be following him and he be established in that program.  He is in agreement with this.  He is aware that subsequent MRIs would need to be performed at [facility] or [facility] imaging for 3 Tesla scan.  We will place a urgent referral to Dr. Langston Masker so he can begin treatment for his brain in the very near future.  Otherwise he remains asymptomatic from his brain disease.  He is not on any dexamethasone. He will finish his palliative radiation to the pelvis and right scapula on 05/18/23.    Osker Mason, PAC

## 2023-05-12 ENCOUNTER — Other Ambulatory Visit: Payer: Self-pay

## 2023-05-12 ENCOUNTER — Ambulatory Visit
Admission: RE | Admit: 2023-05-12 | Discharge: 2023-05-12 | Disposition: A | Discharge status: Home / Self Care | Payer: Medicare Other | Source: Ambulatory Visit | Attending: Radiation Oncology | Admitting: Radiation Oncology

## 2023-05-12 DIAGNOSIS — Z510 Encounter for antineoplastic radiation therapy: Secondary | ICD-10-CM | POA: Diagnosis not present

## 2023-05-12 LAB — RAD ONC ARIA SESSION SUMMARY
Course Elapsed Days: 8
Plan Fractions Treated to Date: 6
Plan Fractions Treated to Date: 6
Plan Prescribed Dose Per Fraction: 3 Gy
Plan Prescribed Dose Per Fraction: 3 Gy
Plan Total Fractions Prescribed: 10
Plan Total Fractions Prescribed: 10
Plan Total Prescribed Dose: 30 Gy
Plan Total Prescribed Dose: 30 Gy
Reference Point Dosage Given to Date: 18 Gy
Reference Point Dosage Given to Date: 18 Gy
Reference Point Session Dosage Given: 3 Gy
Reference Point Session Dosage Given: 3 Gy
Session Number: 6

## 2023-05-12 NOTE — Telephone Encounter (Signed)
Oral Oncology Patient Advocate Encounter  Reached out and spoke with patient regarding PAP paperwork, explained that I would send it to their preferred email via DocuSign.   Confirmed email address: [email or web address].    Patient expressed understanding and consent.  Will follow up once paperwork has been signed and returned.   Ardeen Fillers, CPhT Oncology Pharmacy Patient Advocate  [facility]  [phone] (phone) [phone] (fax) 05/12/2023 9:42 AM

## 2023-05-12 NOTE — Telephone Encounter (Signed)
Patient decided to email me his Proof of Income to make things easier with transportation. Proof of Income has been submitted via e-fax to [facility] to continue processing application. I will continue to follow and update until final determination.    Ardeen Fillers, CPhT Oncology Pharmacy Patient Advocate  [facility]  [phone] (phone) [phone] (fax) 05/12/2023 3:36 PM

## 2023-05-12 NOTE — Telephone Encounter (Signed)
Oral Oncology Patient Advocate Encounter   Submitted application for assistance for Gilotrif to [employer].   Application submitted via e-fax to [phone]   [facility]' phone number [phone].   I will continue to check the status until final determination.    Ardeen Fillers, CPhT Oncology Pharmacy Patient Advocate  [facility]  [phone] (phone) [phone] (fax) 05/12/2023 11:23 AM

## 2023-05-12 NOTE — Telephone Encounter (Signed)
Received notification from [facility] Cares that physical Proof of Income was required. I called the patient and they will be dropping off Proof of Income to MD Office by Wednesday 05/17/23. I will submit to [facility] Cares once received. I will continue to follow and update until final determination.    Ardeen Fillers, CPhT Oncology Pharmacy Patient Advocate  [facility]  [phone] (phone) [phone] (fax) 05/12/2023 12:30 PM

## 2023-05-15 ENCOUNTER — Ambulatory Visit
Admission: RE | Admit: 2023-05-15 | Discharge: 2023-05-15 | Disposition: A | Discharge status: Home / Self Care | Payer: Medicare Other | Source: Ambulatory Visit | Attending: Radiation Oncology | Admitting: Radiation Oncology

## 2023-05-15 ENCOUNTER — Other Ambulatory Visit: Payer: Self-pay

## 2023-05-15 DIAGNOSIS — C3491 Malignant neoplasm of unspecified part of right bronchus or lung: Secondary | ICD-10-CM | POA: Diagnosis present

## 2023-05-15 LAB — RAD ONC ARIA SESSION SUMMARY
Course Elapsed Days: 11
Plan Fractions Treated to Date: 7
Plan Fractions Treated to Date: 7
Plan Prescribed Dose Per Fraction: 3 Gy
Plan Prescribed Dose Per Fraction: 3 Gy
Plan Total Fractions Prescribed: 10
Plan Total Fractions Prescribed: 10
Plan Total Prescribed Dose: 30 Gy
Plan Total Prescribed Dose: 30 Gy
Reference Point Dosage Given to Date: 21 Gy
Reference Point Dosage Given to Date: 21 Gy
Reference Point Session Dosage Given: 3 Gy
Reference Point Session Dosage Given: 3 Gy
Session Number: 7

## 2023-05-16 ENCOUNTER — Ambulatory Visit
Admission: RE | Admit: 2023-05-16 | Discharge: 2023-05-16 | Disposition: A | Discharge status: Home / Self Care | Payer: Medicare Other | Source: Ambulatory Visit | Attending: Radiation Oncology | Admitting: Radiation Oncology

## 2023-05-16 ENCOUNTER — Other Ambulatory Visit (HOSPITAL_COMMUNITY): Payer: Self-pay

## 2023-05-16 ENCOUNTER — Other Ambulatory Visit: Payer: Self-pay

## 2023-05-16 DIAGNOSIS — C3491 Malignant neoplasm of unspecified part of right bronchus or lung: Secondary | ICD-10-CM | POA: Diagnosis not present

## 2023-05-16 LAB — RAD ONC ARIA SESSION SUMMARY
Course Elapsed Days: 12
Plan Fractions Treated to Date: 8
Plan Fractions Treated to Date: 8
Plan Prescribed Dose Per Fraction: 3 Gy
Plan Prescribed Dose Per Fraction: 3 Gy
Plan Total Fractions Prescribed: 10
Plan Total Fractions Prescribed: 10
Plan Total Prescribed Dose: 30 Gy
Plan Total Prescribed Dose: 30 Gy
Reference Point Dosage Given to Date: 24 Gy
Reference Point Dosage Given to Date: 24 Gy
Reference Point Session Dosage Given: 3 Gy
Reference Point Session Dosage Given: 3 Gy
Session Number: 8

## 2023-05-16 NOTE — Telephone Encounter (Signed)
Patient successfully OnBoarded and drug education provided by pharmacist. Medication scheduled to be shipped on 05/17/23 to be delivered on 05/18/23 from [facility] to patient's address. Patient also knows to call me at [phone] with any questions or concerns regarding receiving medication or if there is any unexpected change in co-pay (currently at $0.00 with grant).    Ardeen Fillers, CPhT Oncology Pharmacy Patient Advocate  [facility]  [phone] (phone) [phone] (fax) 05/16/2023 3:19 PM

## 2023-05-16 NOTE — Telephone Encounter (Addendum)
Called to check status of application. Informed by representative that they were going to send notification today that they cannot accept the Proof of Income that patient provided as it was a bank statement. Patient has misplaced award letter from [employer] and representative informed me a letter of verification from MD would suffice in lieu of Social Security Award Letter or 1099 Form. I will send Letter of Attestation over to MD office for signature and will submit once returned. I will continue to follow and update until final determination.   Representative also provided me with fax number for expedited processing.   Fax number is [phone].   Ardeen Fillers, CPhT Oncology Pharmacy Patient Advocate  [facility]  [phone] (phone) [phone] (fax) 05/16/2023 10:21 AM

## 2023-05-16 NOTE — Telephone Encounter (Signed)
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 Planned duration until disease progression or unacceptable drug toxicity.  Counseled patient on administration, dosing, side effects, monitoring, drug-food interactions, safe handling, storage, and disposal. Patient will take 1 tablet (30 mg total) by mouth daily. Take on an empty stomach 1hr before or 2hrs after meals. .  Side effects include but not limited to: diarrhea, acne-like rash or rash, mouth sores, nail changes.   Diarrhea: Patient report having picked up loperamide to have on hand. He knows to start loperamide if needed and call the office if he is having four or more loose stools Rash or acne-like rash: Instructed patient to call to report any rash development  Reviewed with patient importance of keeping a medication schedule and plan for any missed doses.  After discussion with patient no patient barriers to medication adherence identified.   Theodore [name] voiced understanding and appreciation. All questions answered. Medication handout provided.  Provided patient with Oral Chemotherapy Navigation Clinic phone number. Patient knows to call the office with questions or concerns. Oral Chemotherapy Navigation Clinic will continue to follow.  Remi Haggard, PharmD, BCPS, BCOP, CPP Hematology/Oncology Clinical Pharmacist Practitioner [place]/DB/AP Oral Chemotherapy Navigation Clinic [phone]  05/16/2023 3:08 PM

## 2023-05-16 NOTE — Telephone Encounter (Signed)
Oral Oncology Patient Advocate Encounter  Funding opened while in process of PAP. PAP cancelled and grant funding obtained.   Was successful in securing patient a $4,000.00 grant from [employer] to provide copayment coverage for Gilotrif.  This will keep the out of pocket expense at $0.    The billing information is as follows and has been shared with [facility].   Member ID: [id number] Group ID: [id number] RxBin: [id number] PCN: [id number] Dates of Eligibility: 05/16/23 through 05/15/24  Fund name:  Non Small Cell Lung Cancer   Ardeen Fillers, CPhT Oncology Pharmacy Patient Advocate  [facility]  [phone] (phone) [phone] (fax) 05/16/2023 2:08 PM

## 2023-05-17 ENCOUNTER — Other Ambulatory Visit (HOSPITAL_COMMUNITY): Payer: Self-pay

## 2023-05-17 ENCOUNTER — Other Ambulatory Visit: Payer: Self-pay

## 2023-05-17 ENCOUNTER — Ambulatory Visit
Admission: RE | Admit: 2023-05-17 | Discharge: 2023-05-17 | Disposition: A | Discharge status: Home / Self Care | Payer: Medicare Other | Source: Ambulatory Visit | Attending: Radiation Oncology | Admitting: Radiation Oncology

## 2023-05-17 DIAGNOSIS — C3491 Malignant neoplasm of unspecified part of right bronchus or lung: Secondary | ICD-10-CM | POA: Diagnosis not present

## 2023-05-17 LAB — RAD ONC ARIA SESSION SUMMARY
Course Elapsed Days: 13
Plan Fractions Treated to Date: 9
Plan Fractions Treated to Date: 9
Plan Prescribed Dose Per Fraction: 3 Gy
Plan Prescribed Dose Per Fraction: 3 Gy
Plan Total Fractions Prescribed: 10
Plan Total Fractions Prescribed: 10
Plan Total Prescribed Dose: 30 Gy
Plan Total Prescribed Dose: 30 Gy
Reference Point Dosage Given to Date: 27 Gy
Reference Point Dosage Given to Date: 27 Gy
Reference Point Session Dosage Given: 3 Gy
Reference Point Session Dosage Given: 3 Gy
Session Number: 9

## 2023-05-18 ENCOUNTER — Other Ambulatory Visit: Payer: Self-pay

## 2023-05-18 ENCOUNTER — Ambulatory Visit
Admission: RE | Admit: 2023-05-18 | Discharge: 2023-05-18 | Disposition: A | Discharge status: Home / Self Care | Payer: Medicare Other | Source: Ambulatory Visit | Attending: Radiation Oncology | Admitting: Radiation Oncology

## 2023-05-18 DIAGNOSIS — C3491 Malignant neoplasm of unspecified part of right bronchus or lung: Secondary | ICD-10-CM

## 2023-05-18 LAB — RAD ONC ARIA SESSION SUMMARY
Course Elapsed Days: 14
Plan Fractions Treated to Date: 10
Plan Fractions Treated to Date: 10
Plan Prescribed Dose Per Fraction: 3 Gy
Plan Prescribed Dose Per Fraction: 3 Gy
Plan Total Fractions Prescribed: 10
Plan Total Fractions Prescribed: 10
Plan Total Prescribed Dose: 30 Gy
Plan Total Prescribed Dose: 30 Gy
Reference Point Dosage Given to Date: 30 Gy
Reference Point Dosage Given to Date: 30 Gy
Reference Point Session Dosage Given: 3 Gy
Reference Point Session Dosage Given: 3 Gy
Session Number: 10

## 2023-05-24 ENCOUNTER — Other Ambulatory Visit: Payer: Self-pay

## 2023-05-24 NOTE — Radiation Completion Notes (Addendum)
  Radiation Oncology         [phone] ________________________________  Name: Theodore [name] MRN: [id number]  Date of Service: 05/18/2023  DOB: 05-24-1956  End of Treatment Note   Diagnosis: Stage IV non-small cell lung cancer, EGFR mutated adenocarcinoma involving the brain, bones, and adrenal glands.   Intent: Palliative     ==========DELIVERED PLANS==========  First Treatment Date: 2023-05-04 - Last Treatment Date: 2023-05-18   Plan Name: Chest_R Site: Scapula, Right Technique: Isodose Plan Mode: Photon Dose Per Fraction: 3 Gy Prescribed Dose (Delivered / Prescribed): 30 Gy / 30 Gy Prescribed Fxs (Delivered / Prescribed): 10 / 10   Plan Name: Pelvis_R Site: Hip, Right Technique: Isodose Plan Mode: Photon Dose Per Fraction: 3 Gy Prescribed Dose (Delivered / Prescribed): 30 Gy / 30 Gy Prescribed Fxs (Delivered / Prescribed): 10 / 10     ==========ON TREATMENT VISIT DATES========== 2023-05-05, 2023-05-12     See weekly On Treatment Notes in Epic for details. The patient tolerated radiation.    The patient will receive a call in about one month from the radiation oncology department. He will continue follow up with Dr. Ellin Saba as well. He also was found to have multiple brain metastases and desired whole brain radiation closer to home so he was referred to Dr. Langston Masker at [facility] for whole brain radiation. He will continue to follow up with our department as well in the brain oncology program in case he needs salvage radiosurgery in the future.     Osker Mason, PAC

## 2023-05-25 MED ORDER — DIPHENOXYLATE-ATROPINE 2.5-0.025 MG PO TABS: 1.0000 | ORAL_TABLET | Freq: Four times a day (QID) | ORAL | 1 refills | Status: DC | PRN

## 2023-05-30 ENCOUNTER — Other Ambulatory Visit: Payer: Self-pay | Admitting: Radiation Therapy

## 2023-05-30 DIAGNOSIS — C7931 Secondary malignant neoplasm of brain: Secondary | ICD-10-CM

## 2023-05-31 NOTE — Progress Notes (Signed)
[facility] [address], [place] [address]    Clinic Day:  06/01/2023  Referring physician: Gareth Morgan, MD  Patient Care Team: Gareth Morgan, MD as PCP - General (Family Medicine)   ASSESSMENT & PLAN:   Assessment: 1.  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 Sudie Bailey with a chest x-ray on 03/15/2023, with right pleural effusion. - CT chest (03/17/2023): Right middle lobe mass with complete collapse of the right middle lobe, moderate right pleural effusion, nodularity of the right minor fissure, lytic lesions of the right scapula and left third and fourth ribs.  Multiple enhancing liver lesions concerning for metastatic disease. - PET scan (04/06/2023): Right middle lobe primary bronchogenic carcinoma with mediastinal nodal, bone metastasis and probable upper abdominal nodal metastasis.  Right sided pneumothorax.  Metastasis with right acetabulum and proximal right femur.  Bilateral adrenal hypermetabolism with mild nodularity suspicious for early metastasis.  Right renal mass is not hypermetabolic.  No hepatic hypermetabolism. - Brain MRI (03/30/2023): 1.5 x 1.7 cm lesion in the left frontal lobe.  1.5 cm lesion in the high left frontal lobe.  5 mm lesion in the right frontal lobe.  Few other subcentimeter lesions, total of 10 reported. - Right pleural fluid cytology (04/11/2023): Malignant cells consistent with adenocarcinoma. - RML lung FNA (04/18/2023): Lung adenocarcinoma. - Guardant360: EGFR G719C, EGFR S768I.  MSI-high not detected. - HQIONGEX528 tissue next (04/26/2023): PD-L1 22 C3 TPS<1%.  EGFR G719C, EGFR S768I, T p53, EGFR amplification, PIK3CA amplification, BRAF amplification, RB1 splice site SNV.  MSI-high not detected. - Afatinib 30 mg daily started on 05/18/2023   2.  Social/family history: - He lives by himself.  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  He had exposure to chemicals.  He quit smoking in 2012.  Smoked 1 pack/day starting age 26. - Maternal grandmother and maternal uncle had colon cancers.    Plan: 1.  Metastatic adenocarcinoma of the lung to the brain, bones and adrenals: - Afatinib was started on 05/18/2023. - He had diarrhea which was rough for the first 4 days and Imodium did not help.  We have called in Lomotil which is helping. - Reviewed labs: Normal LFTs and creatinine.  CBC grossly normal. - Continue afatinib 30 mg daily.  RTC 4 weeks for follow-up.  If he tolerates well, will increase to 40 mg daily.   2.  Brain metastasis: - He had multiple metastasis. - He was referred to Dr. Laren Boom for WBRT.   3.  Right hip pain: - He has completed XRT to the right shoulder and right hip. - He is not requiring any pain medication.  4.  Bone metastasis: - We discussed the use of bisphosphonates with zoledronic acid to decrease skeletal related events. - We discussed side effects including rare chance of osteonecrosis of the jaw.  He gives [place] permission to proceed with it. - We will start at at next visit in 4 weeks.  5.  Hypokalemia: - He will start potassium 20 mEq daily.    Orders Placed This Encounter  Procedures   CBC with Differential    Standing Status:   Future    Standing Expiration Date:   05/31/2024   Comprehensive metabolic panel    Standing Status:   Future  Standing Expiration Date:   05/31/2024   Magnesium    Standing Status:   Future    Standing Expiration Date:   05/31/2024      I,Katie Daubenspeck,acting as a scribe for [name], MD.,have documented all relevant documentation on the behalf of [name], MD,as directed by  [name], MD while in the presence of [name], MD.   I, [name] MD, have reviewed the above  documentation for accuracy and completeness, and I agree with the above.   [name], MD   6/20/20246:49 PM  CHIEF COMPLAINT:   Diagnosis: metastatic non-small cell adenocarcinoma    Cancer Staging  Lung cancer [facility]) Staging form: Lung, AJCC 8th Edition - Clinical stage from 04/24/2023: Stage IVB (cT1c, cN2, pM1c) - Signed by [name], MD on 05/10/2023    Prior Therapy: none  Current Therapy:  Brain radiation, EGFR TKI    HISTORY OF PRESENT ILLNESS:   Oncology History  Lung cancer (HCC)  04/10/2023 Initial Diagnosis   Lung cancer (HCC)   04/24/2023 Cancer Staging   Staging form: Lung, AJCC 8th Edition - Clinical stage from 04/24/2023: Stage IVB (cT1c, cN2, pM1c) - Signed by [name], MD on 05/10/2023 Histopathologic type: Adenocarcinoma, NOS Stage prefix: Initial diagnosis      INTERVAL HISTORY:   Theodore [name] is a 67 y.o. male presenting to clinic today for follow up of metastatic non-small cell adenocarcinoma. He was last seen by me on 05/10/23.  Today, he states that he is doing well overall. His appetite level is at 65%. His energy level is at 75%.  PAST MEDICAL HISTORY:   Past Medical History: Past Medical History:  Diagnosis Date   COPD (chronic obstructive pulmonary disease) (HCC)     Surgical History: Past Surgical History:  Procedure Laterality Date   BRONCHIAL BIOPSY  04/18/2023   Procedure: BRONCHIAL BIOPSIES;  Surgeon: Josephine Igo, DO;  Location: [facility] ENDOSCOPY;  Service: Pulmonary;;   BRONCHIAL NEEDLE ASPIRATION BIOPSY  04/18/2023   Procedure: BRONCHIAL NEEDLE ASPIRATION BIOPSIES;  Surgeon: Josephine Igo, DO;  Location: [facility] ENDOSCOPY;  Service: Pulmonary;;   CHEST TUBE INSERTION Right 04/11/2023   Procedure: CHEST TUBE INSERTION;  Surgeon: Omar Person, MD;  Location: [facility] ENDOSCOPY;  Service: Pulmonary;  Laterality: Right;  fentanyl IV only   excision of cyst left ankle     MASS EXCISION Left 08/27/2018   Procedure:  EXCISION LIPOMA LEFT BUTTOCK;  Surgeon: Franky Macho, MD;  Location: [facility] ORS;  Service: General;  Laterality: Left;    Social History: Social History   Socioeconomic History   Marital status: Married    Spouse name: Not on file   Number of children: Not on file   Years of education: Not on file   Highest education level: Not on file  Occupational History   Not on file  Tobacco Use   Smoking status: Former    Types: Cigarettes    Quit date: 2012    Years since quitting: 12.4   Smokeless tobacco: Never  Vaping Use   Vaping Use: Never used  Substance and Sexual Activity   Alcohol use: Not Currently    Comment: Quit 1988   Drug use: Not Currently   Sexual activity: Yes    Birth control/protection: None  Other Topics Concern   Not on file  Social History Narrative   Not on file   Social Determinants of Health   Financial Resource Strain: Not on file  Food Insecurity: Food Insecurity Present (  05/02/2023)   Hunger Vital Sign    Worried About Running Out of Food in the Last Year: Never true    Ran Out of Food in the Last Year: Sometimes true  Transportation Needs: No Transportation Needs (05/02/2023)   PRAPARE - Administrator, Civil Service (Medical): No    Lack of Transportation (Non-Medical): No  Physical Activity: Not on file  Stress: Not on file  Social Connections: Not on file  Intimate Partner Violence: Not At Risk (05/02/2023)   Humiliation, Afraid, Rape, and Kick questionnaire    Fear of Current or Ex-Partner: No    Emotionally Abused: No    Physically Abused: No    Sexually Abused: No    Family History: Family History  Problem Relation Age of Onset   Colon cancer Maternal Grandmother    Colon cancer Maternal Uncle     Current Medications:  Current Outpatient Medications:    acetaminophen (TYLENOL) 325 MG tablet, Take 2 tablets (650 mg total) by mouth every 6 (six) hours as needed for mild pain, moderate pain, headache or fever (or Fever >/=  101)., Disp: , Rfl:    afatinib dimaleate (GILOTRIF) 30 MG tablet, Take 1 tablet (30 mg total) by mouth daily. Take on an empty stomach 1hr before or 2hrs after meals., Disp: 30 tablet, Rfl: 0   albuterol (PROVENTIL HFA;VENTOLIN HFA) 108 (90 Base) MCG/ACT inhaler, Inhale 2 puffs into the lungs every 4 (four) hours as needed for wheezing or shortness of breath., Disp: , Rfl:    diphenoxylate-atropine (LOMOTIL) 2.5-0.025 MG tablet, Take 1 tablet by mouth 4 (four) times daily as needed for diarrhea or loose stools., Disp: 60 tablet, Rfl: 1   folic acid (FOLVITE) 1 MG tablet, Take 1 tablet (1 mg total) by mouth daily., Disp: 30 tablet, Rfl: 1   ibuprofen (ADVIL) 200 MG tablet, Take 400 mg by mouth every 6 (six) hours as needed for moderate pain., Disp: , Rfl:    LORazepam (ATIVAN) 0.5 MG tablet, 1 tab po 30 minutes prior to radiation or MRI scans, Disp: 30 tablet, Rfl: 0   potassium chloride SA (KLOR-CON M) 20 MEQ tablet, Take 1 tablet (20 mEq total) by mouth daily., Disp: 90 tablet, Rfl: 3   SYMBICORT 160-4.5 MCG/ACT inhaler, Inhale 1 puff into the lungs 2 (two) times daily as needed., Disp: , Rfl: 11   Allergies: No Active Allergies  REVIEW OF SYSTEMS:   Review of Systems  Constitutional:  Negative for chills, fatigue and fever.  HENT:   Negative for lump/mass, mouth sores, nosebleeds, sore throat and trouble swallowing.   Eyes:  Negative for eye problems.  Respiratory:  Positive for shortness of breath. Negative for cough.   Cardiovascular:  Negative for chest pain, leg swelling and palpitations.  Gastrointestinal:  Positive for diarrhea. Negative for abdominal pain, constipation, nausea and vomiting.  Genitourinary:  Negative for bladder incontinence, difficulty urinating, dysuria, frequency, hematuria and nocturia.   Musculoskeletal:  Negative for arthralgias, back pain, flank pain, myalgias and neck pain.  Skin:  Negative for itching and rash.  Neurological:  Positive for numbness.  Negative for dizziness and headaches.  Hematological:  Does not bruise/bleed easily.  Psychiatric/Behavioral:  Positive for depression. Negative for sleep disturbance and suicidal ideas. The patient is not nervous/anxious.   All other systems reviewed and are negative.    VITALS:   Blood pressure 127/74, pulse 95, temperature 97.7 F (36.5 C), temperature source Tympanic, resp. rate 18, height 5\' 9"  (1.753  m), weight 122 lb 6.4 oz (55.5 kg), SpO2 99 %.  Wt Readings from Last 3 Encounters:  06/01/23 122 lb 6.4 oz (55.5 kg)  05/10/23 122 lb 8 oz (55.6 kg)  05/02/23 124 lb (56.2 kg)    Body mass index is 18.08 kg/m.  Performance status (ECOG): 1 - Symptomatic but completely ambulatory  PHYSICAL EXAM:   Physical Exam Vitals and nursing note reviewed. Exam conducted with a chaperone present.  Constitutional:      Appearance: Normal appearance.  Cardiovascular:     Rate and Rhythm: Normal rate and regular rhythm.     Pulses: Normal pulses.     Heart sounds: Normal heart sounds.  Pulmonary:     Effort: Pulmonary effort is normal.     Breath sounds: Normal breath sounds.  Abdominal:     Palpations: Abdomen is soft. There is no hepatomegaly, splenomegaly or mass.     Tenderness: There is no abdominal tenderness.  Musculoskeletal:     Right lower leg: No edema.     Left lower leg: No edema.  Lymphadenopathy:     Cervical: No cervical adenopathy.     Right cervical: No superficial, deep or posterior cervical adenopathy.    Left cervical: No superficial, deep or posterior cervical adenopathy.     Upper Body:     Right upper body: No supraclavicular or axillary adenopathy.     Left upper body: No supraclavicular or axillary adenopathy.  Neurological:     General: No focal deficit present.     Mental Status: He is alert and oriented to person, place, and time.  Psychiatric:        Mood and Affect: Mood normal.        Behavior: Behavior normal.     LABS:      Latest Ref Rng  & Units 06/01/2023    8:47 AM 04/19/2023    8:12 AM 04/11/2023    4:10 AM  CBC  WBC 4.0 - 10.5 K/uL 8.7  16.4  12.6   Hemoglobin 13.0 - 17.0 g/dL 16.1  09.6  04.5   Hematocrit 39.0 - 52.0 % 44.2  43.1  42.2   Platelets 150 - 400 K/uL 309  379  339       Latest Ref Rng & Units 06/01/2023    8:47 AM 04/19/2023    8:06 AM 04/12/2023    4:38 AM  CMP  Glucose 70 - 99 mg/dL 409  99    BUN 8 - 23 mg/dL 6  14    Creatinine 8.11 - 1.24 mg/dL 9.14  7.82    Sodium 956 - 145 mmol/L 135  130    Potassium 3.5 - 5.1 mmol/L 3.1  4.0    Chloride 98 - 111 mmol/L 100  96    CO2 22 - 32 mmol/L 26  21    Calcium 8.9 - 10.3 mg/dL 8.7  9.1    Total Protein 6.5 - 8.1 g/dL 6.9   6.5   Total Bilirubin 0.3 - 1.2 mg/dL 0.6     Alkaline Phos 38 - 126 U/L 120     AST 15 - 41 U/L 24     ALT 0 - 44 U/L 34        No results found for: "CEA1", "CEA" / No results found for: "CEA1", "CEA" No results found for: "PSA1" No results found for: "OZH086" No results found for: "CAN125"  No results found for: "TOTALPROTELP", "ALBUMINELP", "A1GS", "A2GS", "BETS", "BETA2SER", "GAMS", "  MSPIKE", "SPEI" No results found for: "TIBC", "FERRITIN", "IRONPCTSAT" Lab Results  Component Value Date   LDH 197 (H) 04/12/2023     STUDIES:   MR Brain W Wo Contrast  Result Date: 05/09/2023 CLINICAL DATA:  Metastatic disease evaluation 3T SRS Protocol for radiation treatment planning EXAM: MRI HEAD WITHOUT AND WITH CONTRAST TECHNIQUE: Multiplanar, multiecho pulse sequences of the brain and surrounding structures were obtained without and with intravenous contrast. CONTRAST:  5mL Vueway COMPARISON:  MRI head March 30, 2023. FINDINGS: Brain: Similar versus slightly increased size of a left frontal lobe enhancing lesion, measuring 17 x 20 mm on series 14, image 111. similar versus slightly increased size of a 15 mm lesion more superiorly in the left frontal lobe (series 14, image 126). Similar versus slightly increased size of a 6 mm lesion in  the right frontal lobe (series 14, image 124). Similar ill-defined lesion in the high right posterior frontal lobe (series 13, image 131). Mild interval increase in size of an inferior and posterior right frontal lobe lesion (series 13, image 99). Suspect slight interval increase in size/conspicuity of an enhancing lesion the right frontal lobe (series 13, image 86). Mildly increased size of a right occipital lesion (series 13, image 64). Mildly increased size of a lesion in the posterior left temporal lobe (series 13, image 66). Interval increase in size of a subcentimeter lesion in the posterior left parietal lobe (series 13, image 97). New punctate enhancing lesion in the lateral right temporal lobe (series 13, image 74). Increased conspicuity of additional subcentimeter lesions in the right parietal lobe (series 13, 25) and right temporal lobe (series 13, image 90), likely present on prior. New punctate lesion in the posterior high left frontal lobe (series 14, image 118). New punctate focus of enhancement in the inferior right frontal lobe (series 13, image 115). New punctate enhancing lesion in the right basal ganglia (series 13, image 88). New subcentimeter enhancing lesion in the left frontoparietal region (series 13, image 111). New punctate enhancing lesion in the left occipital lobe (series 13, image 67). No evidence of acute infarct, midline shift, acute hemorrhage, or hydrocephalus. Vascular: Major arterial flow voids are maintained skull base. Skull and upper cervical spine: Normal marrow signal. Sinuses/Orbits: Negative. IMPRESSION: Mild interval increase in size of the previously seen metastases and multiple new small metastases, detailed above. Some of these lesions likely were present on the prior but better characterized on this 3T SRS study. Electronically Signed   By: Feliberto Harts M.D.   On: 05/09/2023 15:09

## 2023-06-01 ENCOUNTER — Inpatient Hospital Stay (HOSPITAL_BASED_OUTPATIENT_CLINIC_OR_DEPARTMENT_OTHER): Payer: Medicare Other | Admitting: Hematology

## 2023-06-01 ENCOUNTER — Other Ambulatory Visit: Payer: Self-pay

## 2023-06-01 ENCOUNTER — Inpatient Hospital Stay: Payer: Medicare Other | Attending: Hematology

## 2023-06-01 VITALS — BP 127/74 | HR 95 | Temp 97.7°F | Resp 18 | Ht 69.0 in | Wt 122.4 lb

## 2023-06-01 DIAGNOSIS — J449 Chronic obstructive pulmonary disease, unspecified: Secondary | ICD-10-CM | POA: Insufficient documentation

## 2023-06-01 DIAGNOSIS — E876 Hypokalemia: Secondary | ICD-10-CM | POA: Diagnosis not present

## 2023-06-01 DIAGNOSIS — C3490 Malignant neoplasm of unspecified part of unspecified bronchus or lung: Secondary | ICD-10-CM

## 2023-06-01 DIAGNOSIS — C7931 Secondary malignant neoplasm of brain: Secondary | ICD-10-CM | POA: Diagnosis present

## 2023-06-01 DIAGNOSIS — Z800 Family history of malignant neoplasm of digestive organs: Secondary | ICD-10-CM | POA: Insufficient documentation

## 2023-06-01 DIAGNOSIS — C7971 Secondary malignant neoplasm of right adrenal gland: Secondary | ICD-10-CM | POA: Diagnosis not present

## 2023-06-01 DIAGNOSIS — C7951 Secondary malignant neoplasm of bone: Secondary | ICD-10-CM | POA: Insufficient documentation

## 2023-06-01 DIAGNOSIS — C342 Malignant neoplasm of middle lobe, bronchus or lung: Secondary | ICD-10-CM | POA: Insufficient documentation

## 2023-06-01 DIAGNOSIS — C7972 Secondary malignant neoplasm of left adrenal gland: Secondary | ICD-10-CM | POA: Diagnosis not present

## 2023-06-01 DIAGNOSIS — Z79899 Other long term (current) drug therapy: Secondary | ICD-10-CM | POA: Insufficient documentation

## 2023-06-01 DIAGNOSIS — Z87891 Personal history of nicotine dependence: Secondary | ICD-10-CM | POA: Diagnosis not present

## 2023-06-01 LAB — CBC WITH DIFFERENTIAL/PLATELET
Abs Immature Granulocytes: 0.04 10*3/uL (ref 0.00–0.07)
Basophils Absolute: 0 10*3/uL (ref 0.0–0.1)
Basophils Relative: 0 %
Eosinophils Absolute: 0.2 10*3/uL (ref 0.0–0.5)
Eosinophils Relative: 3 %
HCT: 44.2 % (ref 39.0–52.0)
Hemoglobin: 14.5 g/dL (ref 13.0–17.0)
Immature Granulocytes: 1 %
Lymphocytes Relative: 14 %
Lymphs Abs: 1.2 10*3/uL (ref 0.7–4.0)
MCH: 27.5 pg (ref 26.0–34.0)
MCHC: 32.8 g/dL (ref 30.0–36.0)
MCV: 83.9 fL (ref 80.0–100.0)
Monocytes Absolute: 1 10*3/uL (ref 0.1–1.0)
Monocytes Relative: 12 %
Neutro Abs: 6.2 10*3/uL (ref 1.7–7.7)
Neutrophils Relative %: 70 %
Platelets: 309 10*3/uL (ref 150–400)
RBC: 5.27 MIL/uL (ref 4.22–5.81)
RDW: 15.9 % — ABNORMAL HIGH (ref 11.5–15.5)
WBC: 8.7 10*3/uL (ref 4.0–10.5)
nRBC: 0 % (ref 0.0–0.2)

## 2023-06-01 LAB — COMPREHENSIVE METABOLIC PANEL
ALT: 34 U/L (ref 0–44)
AST: 24 U/L (ref 15–41)
Albumin: 3.3 g/dL — ABNORMAL LOW (ref 3.5–5.0)
Alkaline Phosphatase: 120 U/L (ref 38–126)
Anion gap: 9 (ref 5–15)
BUN: 6 mg/dL — ABNORMAL LOW (ref 8–23)
CO2: 26 mmol/L (ref 22–32)
Calcium: 8.7 mg/dL — ABNORMAL LOW (ref 8.9–10.3)
Chloride: 100 mmol/L (ref 98–111)
Creatinine, Ser: 0.85 mg/dL (ref 0.61–1.24)
GFR, Estimated: >60 mL/min (ref >60)
Glucose, Bld: 118 mg/dL — ABNORMAL HIGH (ref 70–99)
Potassium: 3.1 mmol/L — ABNORMAL LOW (ref 3.5–5.1)
Sodium: 135 mmol/L (ref 135–145)
Total Bilirubin: 0.6 mg/dL (ref 0.3–1.2)
Total Protein: 6.9 g/dL (ref 6.5–8.1)

## 2023-06-01 LAB — MAGNESIUM: Magnesium: 1.8 mg/dL (ref 1.7–2.4)

## 2023-06-01 MED ORDER — POTASSIUM CHLORIDE CRYS ER 20 MEQ PO TBCR: 20.0000 meq | EXTENDED_RELEASE_TABLET | Freq: Every day | ORAL | 3 refills | Status: DC

## 2023-06-01 NOTE — Progress Notes (Signed)
Patient is taking Gilotrif as prescribed.  He has not missed any doses and reports no side effects at this time.

## 2023-06-01 NOTE — Patient Instructions (Addendum)
[facility] at [facility] Discharge Instructions   You were seen and examined today by Dr. Ellin Saba.  He reviewed the results of your lab work which is mostly normal/stable. Your potassium is low at 3.1. We sent a prescription for potassium tablets to your pharmacy. You will take one pill a day.   Continue Gilotrif as prescribed.   He discussed with you receiving a bone strengthening medication to help prevent fractures from happening in the bones.   We will see you back in 4 weeks. We will repeat blood work at that time.    Thank you for choosing [facility] at [facility] to provide your oncology and hematology care.  To afford each patient quality time with our provider, please arrive at least 15 minutes before your scheduled appointment time.   If you have a lab appointment with the Cancer Center please come in thru the Main Entrance and check in at the main information desk.  You need to re-schedule your appointment should you arrive 10 or more minutes late.  We strive to give you quality time with our providers, and arriving late affects you and other patients whose appointments are after yours.  Also, if you no show three or more times for appointments you may be dismissed from the clinic at the providers discretion.     Again, thank you for choosing [facility].  Our hope is that these requests will decrease the amount of time that you wait before being seen by our physicians.       _____________________________________________________________  Should you have questions after your visit to [facility], please contact our office at [phone] and follow the prompts.  Our office hours are 8:00 a.m. and 4:30 p.m. Monday - Friday.  Please note that voicemails left after 4:00 p.m. may not be returned until the following business day.  We are closed weekends and major holidays.  You do have access to a nurse 24-7,  just call the main number to the clinic [phone] and do not press any options, hold on the line and a nurse will answer the phone.    For prescription refill requests, have your pharmacy contact our office and allow 72 hours.    Due to Covid, you will need to wear a mask upon entering the hospital. If you do not have a mask, a mask will be given to you at the Main Entrance upon arrival. For doctor visits, patients may have 1 support person age 90 or older with them. For treatment visits, patients can not have anyone with them due to social distancing guidelines and our immunocompromised population.

## 2023-06-02 ENCOUNTER — Other Ambulatory Visit (HOSPITAL_COMMUNITY): Payer: Self-pay

## 2023-06-02 ENCOUNTER — Other Ambulatory Visit: Payer: Self-pay | Admitting: Hematology

## 2023-06-02 ENCOUNTER — Other Ambulatory Visit: Payer: Self-pay

## 2023-06-02 DIAGNOSIS — C342 Malignant neoplasm of middle lobe, bronchus or lung: Secondary | ICD-10-CM

## 2023-06-02 MED ORDER — AFATINIB DIMALEATE 30 MG PO TABS
30.0000 mg | ORAL_TABLET | Freq: Every day | ORAL | 0 refills | Status: DC
Start: 2023-06-02 — End: 2023-06-27
  Filled 2023-06-02: qty 30, 30d supply, fill #0

## 2023-06-07 ENCOUNTER — Other Ambulatory Visit (HOSPITAL_COMMUNITY): Payer: Self-pay

## 2023-06-08 ENCOUNTER — Other Ambulatory Visit (HOSPITAL_COMMUNITY): Payer: Self-pay

## 2023-06-08 ENCOUNTER — Other Ambulatory Visit: Payer: Self-pay

## 2023-06-09 ENCOUNTER — Other Ambulatory Visit (HOSPITAL_COMMUNITY): Payer: Self-pay

## 2023-06-09 ENCOUNTER — Other Ambulatory Visit: Payer: Self-pay

## 2023-06-09 ENCOUNTER — Telehealth: Payer: Self-pay

## 2023-06-09 MED ORDER — CLINDAMYCIN PHOSPHATE 1 % EX GEL: Freq: Two times a day (BID) | CUTANEOUS | 0 refills | Status: DC

## 2023-06-09 MED ORDER — HYDROCORTISONE 2.5 % EX SOLN: CUTANEOUS | 0 refills | Status: AC

## 2023-06-09 NOTE — Telephone Encounter (Signed)
Patient called reporting a rash on his face with dry skin. Dr.Katragadda made aware. Patient to remain on Afatinib, but orders received for Clindamycin gel BID and Hydrocortisone cream BID. Patient called and made aware. Understanding verbalized.

## 2023-06-13 NOTE — Progress Notes (Signed)
  Radiation Oncology         [phone] ________________________________  Name: Theodore [name] MRN: [id number]  Date of Service: 06/19/2023  DOB: [birth year]  Post Treatment Telephone Note  Diagnosis:  Stage IV non-small cell lung cancer, EGFR mutated adenocarcinoma involving the brain, bones, and adrenal glands.   (as documented in provider EOT note)   The patient was available for call today.  The patient did not note fatigue during radiation. The patient did not note skin changes in the field of radiation during therapy. The patient has noticed improvement in pain in the area(s) treated with radiation. The patient is not taking dexamethasone. The patient does not have symptoms of  weakness or loss of control of the extremities. The patient does not have symptoms of headache. The patient does not have symptoms of seizure or uncontrolled movement. The patient does not have symptoms of changes in vision. The patient does not have changes in speech. The patient does not have confusion.   Patient currently complains of occasional, mild SOB w/ exertion.  The patient is scheduled for ongoing care with Dr. Ellin Saba  in medical oncology. The patient was encouraged to call if he develops concerns or questions regarding radiation.   This concludes the interaction.  Ruel Favors, LPN\

## 2023-06-19 ENCOUNTER — Ambulatory Visit
Admission: RE | Admit: 2023-06-19 | Discharge: 2023-06-19 | Disposition: A | Discharge status: Home / Self Care | Payer: Medicare Other | Source: Ambulatory Visit | Attending: Radiation Oncology | Admitting: Radiation Oncology

## 2023-06-27 ENCOUNTER — Other Ambulatory Visit: Payer: Self-pay | Admitting: Hematology

## 2023-06-27 ENCOUNTER — Other Ambulatory Visit: Payer: Self-pay

## 2023-06-27 ENCOUNTER — Other Ambulatory Visit (HOSPITAL_COMMUNITY): Payer: Self-pay

## 2023-06-27 DIAGNOSIS — C342 Malignant neoplasm of middle lobe, bronchus or lung: Secondary | ICD-10-CM

## 2023-06-27 MED ORDER — AFATINIB DIMALEATE 30 MG PO TABS
30.0000 mg | ORAL_TABLET | Freq: Every day | ORAL | 0 refills | Status: DC
Start: 2023-06-27 — End: 2023-07-06
  Filled 2023-06-27: qty 30, 30d supply, fill #0

## 2023-06-28 ENCOUNTER — Other Ambulatory Visit: Payer: Self-pay | Admitting: Hematology

## 2023-06-29 ENCOUNTER — Encounter (INDEPENDENT_AMBULATORY_CARE_PROVIDER_SITE_OTHER): Payer: Self-pay | Admitting: *Deleted

## 2023-07-03 ENCOUNTER — Other Ambulatory Visit (HOSPITAL_COMMUNITY): Payer: Self-pay

## 2023-07-05 NOTE — Progress Notes (Signed)
[facility] [address] [place] [address]    Clinic Day:  07/06/2023  Referring physician: Gareth Morgan, MD  Patient Care Team: Benita Stabile, MD as PCP - General (Internal Medicine)   ASSESSMENT & PLAN:   Assessment: 1.  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 Sudie Bailey with a chest x-ray on 03/15/2023, with right pleural effusion. - CT chest (03/17/2023): Right middle lobe mass with complete collapse of the right middle lobe, moderate right pleural effusion, nodularity of the right minor fissure, lytic lesions of the right scapula and left third and fourth ribs.  Multiple enhancing liver lesions concerning for metastatic disease. - PET scan (04/06/2023): Right middle lobe primary bronchogenic carcinoma with mediastinal nodal, bone metastasis and probable upper abdominal nodal metastasis.  Right sided pneumothorax.  Metastasis with right acetabulum and proximal right femur.  Bilateral adrenal hypermetabolism with mild nodularity suspicious for early metastasis.  Right renal mass is not hypermetabolic.  No hepatic hypermetabolism. - Brain MRI (03/30/2023): 1.5 x 1.7 cm lesion in the left frontal lobe.  1.5 cm lesion in the high left frontal lobe.  5 mm lesion in the right frontal lobe.  Few other subcentimeter lesions, total of 10 reported. - Right pleural fluid cytology (04/11/2023): Malignant cells consistent with adenocarcinoma. - RML lung FNA (04/18/2023): Lung adenocarcinoma. - Guardant360: EGFR G719C, EGFR S768I.  MSI-high not detected. - [id number] tissue next (04/26/2023): PD-L1 22 C3 TPS<1%.  EGFR G719C, EGFR S768I, T p53, EGFR amplification, PIK3CA amplification, BRAF amplification, RB1 splice site SNV.  MSI-high not detected. - Afatinib 30 mg daily started on 05/18/2023, dose increased to 40 mg daily on 07/06/2023   2.  Social/family history: - Theodore [name]  lives by himself.  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  Theodore [name] had exposure to chemicals.  Theodore [name] quit smoking in 2012.  Smoked 1 pack/day starting age 42. - Maternal grandmother and maternal uncle had colon cancers.    Plan: 1.  Metastatic adenocarcinoma of the lung to the brain, bones and adrenals: - Theodore [name] is tolerating afatinib reasonably well. - Theodore [name] has about 2 soft to watery stools per day.  Theodore [name] requires about 1 Lomotil tablet daily as needed. - Theodore [name] also had acneform rash on the face.  Continue hydrocortisone 2.5% solution twice daily.  Rash has improved. - Reviewed labs today: Normal LFTs and creatinine.  CBC grossly normal. - Recommend increasing afatinib dose to 40 mg daily.  RTC 4 weeks for follow-up.   2.  Brain metastasis: - Theodore [name] has multiple brain metastasis.  Theodore [name] discussed with Dr. Langston Masker and patient was made to watchful wait at this time because of high brain penetration of TKI's. - Theodore [name] is scheduled for MRI in [place] in September.   3.  Right hip pain: - S/p XRT to the right shoulder and right hip.  Not requiring pain medication.  4.  Bone metastasis: - We discussed use of bisphosphonates to decrease skeletal related events. - Theodore [name] is edentulous in the upper jaw and has about 10 teeth left on the lower jaw.  Theodore [name] does not have any dental issues. - We discussed rare chance of osteonecrosis of the jaw.  Recommend start taking calcium 1 tablet daily. - Theodore [name] will proceed with Zometa today.  5.  Hypokalemia: - Continue  potassium 20 mill colons daily.  Potassium is normal today.    No orders of the defined types were placed in this encounter.     Alben Deeds Teague,acting as a Neurosurgeon for [name], MD.,have documented all relevant documentation on the behalf of [name], MD,as directed by  [name], MD while in the presence of [name], MD.  I,  [name] MD, have reviewed the above documentation for accuracy and completeness, and I agree with the above.    [name], MD   7/25/202412:42 PM  CHIEF COMPLAINT:   Diagnosis: metastatic non-small cell adenocarcinoma    Cancer Staging  Lung cancer [facility]) Staging form: Lung, AJCC 8th Edition - Clinical stage from 04/24/2023: Stage IVB (cT1c, cN2, pM1c) - Signed by [name], MD on 05/10/2023    Prior Therapy: none  Current Therapy:  Brain radiation, EGFR TKI    HISTORY OF PRESENT ILLNESS:   Oncology History  Lung cancer (HCC)  04/10/2023 Initial Diagnosis   Lung cancer (HCC)   04/24/2023 Cancer Staging   Staging form: Lung, AJCC 8th Edition - Clinical stage from 04/24/2023: Stage IVB (cT1c, cN2, pM1c) - Signed by [name], MD on 05/10/2023 Histopathologic type: Adenocarcinoma, NOS Stage prefix: Initial diagnosis      INTERVAL HISTORY:   Theodore [name] is a 67 y.o. male presenting to clinic today for follow up of metastatic non-small cell adenocarcinoma. Theodore [name] was last seen by me on 06/01/23.  Today, Theodore [name] states that Theodore [name] is doing well overall. His appetite level is at 90%. His energy level is at 70%.  PAST MEDICAL HISTORY:   Past Medical History: Past Medical History:  Diagnosis Date   COPD (chronic obstructive pulmonary disease) (HCC)     Surgical History: Past Surgical History:  Procedure Laterality Date   BRONCHIAL BIOPSY  04/18/2023   Procedure: BRONCHIAL BIOPSIES;  Surgeon: Josephine Igo, DO;  Location: [facility];  Service: Pulmonary;;   BRONCHIAL NEEDLE ASPIRATION BIOPSY  04/18/2023   Procedure: BRONCHIAL NEEDLE ASPIRATION BIOPSIES;  Surgeon: Josephine Igo, DO;  Location: [facility] ENDOS[facility];  Service: Pulmonary;;   CHEST TUBE INSERTION Right 04/11/2023   Procedure: CHEST TUBE INSERTION;  Surgeon: Omar Person, MD;  Location: [facility] ENDOSCOPY;  Service: Pulmonary;  Laterality: Right;  fentanyl IV only   excision of cyst left ankle      MASS EXCISION Left 08/27/2018   Procedure: EXCISION LIPOMA LEFT BUTTOCK;  Surgeon: Franky Macho, MD;  Location: [facility] ORS;  Service: General;  Laterality: Left;    Social History: Social History   Socioeconomic History   Marital status: Married    Spouse name: Not on file   Number of children: Not on file   Years of education: Not on file   Highest education level: Not on file  Occupational History   Not on file  Tobacco Use   Smoking status: Former    Current packs/day: 0.00    Types: Cigarettes    Quit date: 2012    Years since quitting: 12.5   Smokeless tobacco: Never  Vaping Use   Vaping status: Never Used  Substance and Sexual Activity   Alcohol use: Not Currently    Comment: Quit 1988   Drug use: Not Currently   Sexual activity: Yes    Birth control/protection: None  Other Topics Concern   Not on file  Social History Narrative   Not on file   Social Determinants of Health   Financial Resource Strain: Not on file  Food  Insecurity: Food Insecurity Present (05/02/2023)   Hunger Vital Sign    Worried About Running Out of Food in the Last Year: Never true    Ran Out of Food in the Last Year: Sometimes true  Transportation Needs: No Transportation Needs (05/02/2023)   PRAPARE - Administrator, Civil Service (Medical): No    Lack of Transportation (Non-Medical): No  Physical Activity: Not on file  Stress: Not on file  Social Connections: Not on file  Intimate Partner Violence: Not At Risk (05/02/2023)   Humiliation, Afraid, Rape, and Kick questionnaire    Fear of Current or Ex-Partner: No    Emotionally Abused: No    Physically Abused: No    Sexually Abused: No    Family History: Family History  Problem Relation Age of Onset   Colon cancer Maternal Grandmother    Colon cancer Maternal Uncle     Current Medications:  Current Outpatient Medications:    acetaminophen (TYLENOL) 325 MG tablet, Take 2 tablets (650 mg total) by mouth every 6 (six)  hours as needed for mild pain, moderate pain, headache or fever (or Fever >/= 101)., Disp: , Rfl:    albuterol (PROVENTIL HFA;VENTOLIN HFA) 108 (90 Base) MCG/ACT inhaler, Inhale 2 puffs into the lungs every 4 (four) hours as needed for wheezing or shortness of breath., Disp: , Rfl:    clindamycin (CLINDAGEL) 1 % gel, Apply topically 2 (two) times daily., Disp: 30 g, Rfl: 0   diphenoxylate-atropine (LOMOTIL) 2.5-0.025 MG tablet, Take 1 tablet by mouth 4 (four) times daily as needed for diarrhea or loose stools., Disp: 60 tablet, Rfl: 1   folic acid (FOLVITE) 1 MG tablet, TAKE 1 TABLET BY MOUTH EVERY DAY, Disp: 30 tablet, Rfl: 1   HYDROCORTISONE, TOPICAL, 2.5 % SOLN, Apply topically twice daily., Disp: 30 mL, Rfl: 0   ibuprofen (ADVIL) 200 MG tablet, Take 400 mg by mouth every 6 (six) hours as needed for moderate pain., Disp: , Rfl:    LORazepam (ATIVAN) 0.5 MG tablet, 1 tab po 30 minutes prior to radiation or MRI scans, Disp: 30 tablet, Rfl: 0   potassium chloride SA (KLOR-CON M) 20 MEQ tablet, Take 1 tablet (20 mEq total) by mouth daily., Disp: 90 tablet, Rfl: 3   SYMBICORT 160-4.5 MCG/ACT inhaler, Inhale 1 puff into the lungs 2 (two) times daily as needed., Disp: , Rfl: 11   afatinib dimaleate (GILOTRIF) 40 MG tablet, Take 1 tablet (40 mg total) by mouth daily. Take on an empty stomach 1hr before or 2hrs after meals., Disp: 30 tablet, Rfl: 3 No current facility-administered medications for this visit.  Facility-Administered Medications Ordered in Other Visits:    Zoledronic Acid (ZOMETA) IVPB 4 mg, 4 mg, Intravenous, Once, [name], MD, Last Rate: 400 mL/hr at 07/06/23 1238, 4 mg at 07/06/23 1238   Allergies: No Active Allergies  REVIEW OF SYSTEMS:   Review of Systems  Constitutional:  Negative for chills, fatigue and fever.  HENT:   Negative for lump/mass, mouth sores, nosebleeds, sore throat and trouble swallowing.   Eyes:  Negative for eye problems.  Respiratory:  Positive  for shortness of breath. Negative for cough.   Cardiovascular:  Negative for chest pain, leg swelling and palpitations.  Gastrointestinal:  Positive for diarrhea. Negative for abdominal pain, constipation, nausea and vomiting.  Genitourinary:  Negative for bladder incontinence, difficulty urinating, dysuria, frequency, hematuria and nocturia.   Musculoskeletal:  Negative for arthralgias, back pain, flank pain, myalgias and neck pain.  Skin:  Negative for itching and rash.  Neurological:  Positive for numbness. Negative for dizziness and headaches.  Hematological:  Does not bruise/bleed easily.  Psychiatric/Behavioral:  Positive for sleep disturbance. Negative for depression and suicidal ideas. The patient is not nervous/anxious.   All other systems reviewed and are negative.    VITALS:   Blood pressure 123/87, pulse (!) 104, temperature 98 F (36.7 C), temperature source Oral, resp. rate 18, height 5\' 9"  (1.753 m), weight 120 lb 4.8 oz (54.6 kg), SpO2 97%.  Wt Readings from Last 3 Encounters:  07/06/23 120 lb 4.8 oz (54.6 kg)  06/01/23 122 lb 6.4 oz (55.5 kg)  05/10/23 122 lb 8 oz (55.6 kg)    Body mass index is 17.77 kg/m.  Performance status (ECOG): 1 - Symptomatic but completely ambulatory  PHYSICAL EXAM:   Physical Exam Vitals and nursing note reviewed. Exam conducted with a chaperone present.  Constitutional:      Appearance: Normal appearance.  Cardiovascular:     Rate and Rhythm: Normal rate and regular rhythm.     Pulses: Normal pulses.     Heart sounds: Normal heart sounds.  Pulmonary:     Effort: Pulmonary effort is normal.     Breath sounds: Normal breath sounds.  Abdominal:     Palpations: Abdomen is soft. There is no hepatomegaly, splenomegaly or mass.     Tenderness: There is no abdominal tenderness.  Musculoskeletal:     Right lower leg: No edema.     Left lower leg: No edema.  Lymphadenopathy:     Cervical: No cervical adenopathy.     Right cervical: No  superficial, deep or posterior cervical adenopathy.    Left cervical: No superficial, deep or posterior cervical adenopathy.     Upper Body:     Right upper body: No supraclavicular or axillary adenopathy.     Left upper body: No supraclavicular or axillary adenopathy.  Neurological:     General: No focal deficit present.     Mental Status: Theodore [name] is alert and oriented to person, place, and time.  Psychiatric:        Mood and Affect: Mood normal.        Behavior: Behavior normal.     LABS:      Latest Ref Rng & Units 07/06/2023   10:56 AM 06/01/2023    8:47 AM 04/19/2023    8:12 AM  CBC  WBC 4.0 - 10.5 K/uL 6.3  8.7  16.4   Hemoglobin 13.0 - 17.0 g/dL 16.1  09.6  04.5   Hematocrit 39.0 - 52.0 % 43.2  44.2  43.1   Platelets 150 - 400 K/uL 301  309  379       Latest Ref Rng & Units 07/06/2023   10:56 AM 06/01/2023    8:47 AM 04/19/2023    8:06 AM  CMP  Glucose 70 - 99 mg/dL 409  811  99   BUN 8 - 23 mg/dL 8  6  14    Creatinine 0.61 - 1.24 mg/dL 9.14  7.82  9.56   Sodium 135 - 145 mmol/L 135  135  130   Potassium 3.5 - 5.1 mmol/L 4.0  3.1  4.0   Chloride 98 - 111 mmol/L 100  100  96   CO2 22 - 32 mmol/L 27  26  21    Calcium 8.9 - 10.3 mg/dL 8.8  8.7  9.1   Total Protein 6.5 - 8.1 g/dL 7.3  6.9  Total Bilirubin 0.3 - 1.2 mg/dL 0.9  0.6    Alkaline Phos 38 - 126 U/L 93  120    AST 15 - 41 U/L 24  24    ALT 0 - 44 U/L 38  34       No results found for: "CEA1", "CEA" / No results found for: "CEA1", "CEA" No results found for: "PSA1" No results found for: "ZOX096" No results found for: "CAN125"  No results found for: "TOTALPROTELP", "ALBUMINELP", "A1GS", "A2GS", "BETS", "BETA2SER", "GAMS", "MSPIKE", "SPEI" No results found for: "TIBC", "FERRITIN", "IRONPCTSAT" Lab Results  Component Value Date   LDH 197 (H) 04/12/2023     STUDIES:   No results found.

## 2023-07-06 ENCOUNTER — Other Ambulatory Visit (HOSPITAL_COMMUNITY): Payer: Self-pay

## 2023-07-06 ENCOUNTER — Inpatient Hospital Stay: Payer: Medicare Other | Admitting: Hematology

## 2023-07-06 ENCOUNTER — Inpatient Hospital Stay: Payer: Medicare Other | Attending: Hematology

## 2023-07-06 ENCOUNTER — Inpatient Hospital Stay: Payer: Medicare Other

## 2023-07-06 ENCOUNTER — Other Ambulatory Visit: Payer: Self-pay

## 2023-07-06 VITALS — BP 120/76 | HR 91 | Temp 97.6°F | Resp 18

## 2023-07-06 DIAGNOSIS — Z87891 Personal history of nicotine dependence: Secondary | ICD-10-CM | POA: Diagnosis not present

## 2023-07-06 DIAGNOSIS — C7931 Secondary malignant neoplasm of brain: Secondary | ICD-10-CM | POA: Diagnosis present

## 2023-07-06 DIAGNOSIS — M25551 Pain in right hip: Secondary | ICD-10-CM | POA: Diagnosis not present

## 2023-07-06 DIAGNOSIS — C342 Malignant neoplasm of middle lobe, bronchus or lung: Secondary | ICD-10-CM

## 2023-07-06 DIAGNOSIS — C7951 Secondary malignant neoplasm of bone: Secondary | ICD-10-CM

## 2023-07-06 DIAGNOSIS — Z79899 Other long term (current) drug therapy: Secondary | ICD-10-CM | POA: Insufficient documentation

## 2023-07-06 DIAGNOSIS — Z923 Personal history of irradiation: Secondary | ICD-10-CM | POA: Diagnosis not present

## 2023-07-06 DIAGNOSIS — Z7951 Long term (current) use of inhaled steroids: Secondary | ICD-10-CM | POA: Insufficient documentation

## 2023-07-06 DIAGNOSIS — E876 Hypokalemia: Secondary | ICD-10-CM | POA: Diagnosis not present

## 2023-07-06 DIAGNOSIS — C3490 Malignant neoplasm of unspecified part of unspecified bronchus or lung: Secondary | ICD-10-CM

## 2023-07-06 LAB — CBC WITH DIFFERENTIAL/PLATELET
Abs Immature Granulocytes: 0.01 10*3/uL (ref 0.00–0.07)
Basophils Absolute: 0 10*3/uL (ref 0.0–0.1)
Basophils Relative: 0 %
Eosinophils Absolute: 0.3 10*3/uL (ref 0.0–0.5)
Eosinophils Relative: 5 %
HCT: 43.2 % (ref 39.0–52.0)
Hemoglobin: 14.1 g/dL (ref 13.0–17.0)
Immature Granulocytes: 0 %
Lymphocytes Relative: 30 %
Lymphs Abs: 1.9 10*3/uL (ref 0.7–4.0)
MCH: 27.9 pg (ref 26.0–34.0)
MCHC: 32.6 g/dL (ref 30.0–36.0)
MCV: 85.5 fL (ref 80.0–100.0)
Monocytes Absolute: 0.9 10*3/uL (ref 0.1–1.0)
Monocytes Relative: 13 %
Neutro Abs: 3.2 10*3/uL (ref 1.7–7.7)
Neutrophils Relative %: 52 %
Platelets: 301 10*3/uL (ref 150–400)
RBC: 5.05 MIL/uL (ref 4.22–5.81)
RDW: 18.2 % — ABNORMAL HIGH (ref 11.5–15.5)
WBC: 6.3 10*3/uL (ref 4.0–10.5)
nRBC: 0 % (ref 0.0–0.2)

## 2023-07-06 LAB — COMPREHENSIVE METABOLIC PANEL
ALT: 38 U/L (ref 0–44)
AST: 24 U/L (ref 15–41)
Albumin: 3.7 g/dL (ref 3.5–5.0)
Alkaline Phosphatase: 93 U/L (ref 38–126)
Anion gap: 8 (ref 5–15)
BUN: 8 mg/dL (ref 8–23)
CO2: 27 mmol/L (ref 22–32)
Calcium: 8.8 mg/dL — ABNORMAL LOW (ref 8.9–10.3)
Chloride: 100 mmol/L (ref 98–111)
Creatinine, Ser: 0.77 mg/dL (ref 0.61–1.24)
GFR, Estimated: >60 mL/min (ref >60)
Glucose, Bld: 110 mg/dL — ABNORMAL HIGH (ref 70–99)
Potassium: 4 mmol/L (ref 3.5–5.1)
Sodium: 135 mmol/L (ref 135–145)
Total Bilirubin: 0.9 mg/dL (ref 0.3–1.2)
Total Protein: 7.3 g/dL (ref 6.5–8.1)

## 2023-07-06 LAB — MAGNESIUM: Magnesium: 1.9 mg/dL (ref 1.7–2.4)

## 2023-07-06 MED ORDER — AFATINIB DIMALEATE 40 MG PO TABS
40.0000 mg | ORAL_TABLET | Freq: Every day | ORAL | 3 refills | Status: DC
Start: 2023-07-06 — End: 2023-10-17
  Filled 2023-07-06: qty 30, 30d supply, fill #0
  Filled 2023-08-01: qty 30, 30d supply, fill #1
  Filled 2023-08-23: qty 30, 30d supply, fill #2
  Filled 2023-09-18: qty 30, 30d supply, fill #3

## 2023-07-06 MED ORDER — SODIUM CHLORIDE 0.9 % IV SOLN: Freq: Once | INTRAVENOUS | Status: AC

## 2023-07-06 MED ORDER — ZOLEDRONIC ACID 4 MG/100ML IV SOLN
4.0000 mg | Freq: Once | INTRAVENOUS | Status: AC
  Administered 2023-07-06: 4 mg via INTRAVENOUS
  Filled 2023-07-06: qty 100

## 2023-07-06 NOTE — Patient Instructions (Signed)
[facility]  Discharge Instructions: Thank you for choosing [facility] to provide your oncology and hematology care.  If you have a lab appointment with the Cancer Center - please note that after April 8th, 2024, all labs will be drawn in the cancer center.  You do not have to check in or register with the main entrance as you have in the past but will complete your check-in in the cancer center.  Wear comfortable clothing and clothing appropriate for easy access to any Portacath or PICC line.   We strive to give you quality time with your provider. You may need to reschedule your appointment if you arrive late (15 or more minutes).  Arriving late affects you and other patients whose appointments are after yours.  Also, if you miss three or more appointments without notifying the office, you may be dismissed from the clinic at the provider's discretion.      For prescription refill requests, have your pharmacy contact our office and allow 72 hours for refills to be completed.    Today you received Zometa 4mg  infusion.   BELOW ARE SYMPTOMS THAT SHOULD BE REPORTED IMMEDIATELY: *FEVER GREATER THAN 100.4 F (38 C) OR HIGHER *CHILLS OR SWEATING *NAUSEA AND VOMITING THAT IS NOT CONTROLLED WITH YOUR NAUSEA MEDICATION *UNUSUAL SHORTNESS OF BREATH *UNUSUAL BRUISING OR BLEEDING *URINARY PROBLEMS (pain or burning when urinating, or frequent urination) *BOWEL PROBLEMS (unusual diarrhea, constipation, pain near the anus) TENDERNESS IN MOUTH AND THROAT WITH OR WITHOUT PRESENCE OF ULCERS (sore throat, sores in mouth, or a toothache) UNUSUAL RASH, SWELLING OR PAIN  UNUSUAL VAGINAL DISCHARGE OR ITCHING   Items with * indicate a potential emergency and should be followed up as soon as possible or go to the Emergency Department if any problems should occur.  Please show the CHEMOTHERAPY ALERT CARD or IMMUNOTHERAPY ALERT CARD at check-in to the Emergency Department and  triage nurse.  Should you have questions after your visit or need to cancel or reschedule your appointment, please contact [facility] [phone]  and follow the prompts.  Office hours are 8:00 a.m. to 4:30 p.m. Monday - Friday. Please note that voicemails left after 4:00 p.m. may not be returned until the following business day.  We are closed weekends and major holidays. You have access to a nurse at all times for urgent questions. Please call the main number to the clinic [phone] and follow the prompts.  For any non-urgent questions, you may also contact your provider using MyChart. We now offer e-Visits for anyone 35 and older to request care online for non-urgent symptoms. For details visit [email or web address].   Also download the MyChart app! Go to the app store, search "MyChart", open the app, select [facility], and log in with your MyChart username and password.

## 2023-07-06 NOTE — Progress Notes (Signed)
Pt presents today for Zometa 4mg  IV per provider's order. Vital signs stable and pt voiced no new complaints at this time.  Peripheral IV started with good blood return pre and post infusion.  Treatment given today per MD orders. Tolerated infusion without adverse affects. Vital signs stable. No complaints at this time. Discharged from clinic ambulatory with walker in stable condition. Alert and oriented x 3. F/U with [facility] as scheduled.

## 2023-07-06 NOTE — Patient Instructions (Signed)
[facility] at [facility] Discharge Instructions   You were seen and examined today by Dr. Ellin Saba.  He reviewed the results of your lab work which are normal/stable.   We will proceed with your Zometa infusion today. You will need to take calcium supplements while receiving this medication.   Dr. Kirtland Bouchard increased the dose of your afatinib to 40 mg. Continue taking the 30 mg tablets until you receive the new pills.  We will see you back in 4 weeks.   Return as scheduled.    Thank you for choosing [facility] at [facility] to provide your oncology and hematology care.  To afford each patient quality time with our provider, please arrive at least 15 minutes before your scheduled appointment time.   If you have a lab appointment with the Cancer Center please come in thru the Main Entrance and check in at the main information desk.  You need to re-schedule your appointment should you arrive 10 or more minutes late.  We strive to give you quality time with our providers, and arriving late affects you and other patients whose appointments are after yours.  Also, if you no show three or more times for appointments you may be dismissed from the clinic at the providers discretion.     Again, thank you for choosing [facility].  Our hope is that these requests will decrease the amount of time that you wait before being seen by our physicians.       _____________________________________________________________  Should you have questions after your visit to [facility], please contact our office at [phone] and follow the prompts.  Our office hours are 8:00 a.m. and 4:30 p.m. Monday - Friday.  Please note that voicemails left after 4:00 p.m. may not be returned until the following business day.  We are closed weekends and major holidays.  You do have access to a nurse 24-7, just call the main number to the clinic [phone]  and do not press any options, hold on the line and a nurse will answer the phone.    For prescription refill requests, have your pharmacy contact our office and allow 72 hours.    Due to Covid, you will need to wear a mask upon entering the hospital. If you do not have a mask, a mask will be given to you at the Main Entrance upon arrival. For doctor visits, patients may have 1 support person age 42 or older with them. For treatment visits, patients can not have anyone with them due to social distancing guidelines and our immunocompromised population.

## 2023-07-06 NOTE — Progress Notes (Signed)
Patient is taking afatinib as prescribed. He has not missed any doses and reports no side effects at this time.

## 2023-07-07 ENCOUNTER — Other Ambulatory Visit (HOSPITAL_COMMUNITY): Payer: Self-pay

## 2023-07-07 ENCOUNTER — Other Ambulatory Visit: Payer: Self-pay

## 2023-08-01 ENCOUNTER — Other Ambulatory Visit (HOSPITAL_COMMUNITY): Payer: Self-pay

## 2023-08-02 ENCOUNTER — Other Ambulatory Visit (HOSPITAL_COMMUNITY): Payer: Self-pay

## 2023-08-08 ENCOUNTER — Other Ambulatory Visit: Payer: Self-pay

## 2023-08-08 DIAGNOSIS — C342 Malignant neoplasm of middle lobe, bronchus or lung: Secondary | ICD-10-CM

## 2023-08-08 NOTE — Progress Notes (Signed)
[facility] [address], [place] [address]    Clinic Day:  08/09/2023  Referring physician: Benita Stabile, MD  Patient Care Team: Benita Stabile, MD as PCP - General (Internal Medicine)   ASSESSMENT & PLAN:   Assessment: 1.  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 Sudie Bailey with a chest x-ray on 03/15/2023, with right pleural effusion. - CT chest (03/17/2023): Right middle lobe mass with complete collapse of the right middle lobe, moderate right pleural effusion, nodularity of the right minor fissure, lytic lesions of the right scapula and left third and fourth ribs.  Multiple enhancing liver lesions concerning for metastatic disease. - PET scan (04/06/2023): Right middle lobe primary bronchogenic carcinoma with mediastinal nodal, bone metastasis and probable upper abdominal nodal metastasis.  Right sided pneumothorax.  Metastasis with right acetabulum and proximal right femur.  Bilateral adrenal hypermetabolism with mild nodularity suspicious for early metastasis.  Right renal mass is not hypermetabolic.  No hepatic hypermetabolism. - Brain MRI (03/30/2023): 1.5 x 1.7 cm lesion in the left frontal lobe.  1.5 cm lesion in the high left frontal lobe.  5 mm lesion in the right frontal lobe.  Few other subcentimeter lesions, total of 10 reported. - Right pleural fluid cytology (04/11/2023): Malignant cells consistent with adenocarcinoma. - RML lung FNA (04/18/2023): Lung adenocarcinoma. - Guardant360: EGFR G719C, EGFR S768I.  MSI-high not detected. - [id number] tissue next (04/26/2023): PD-L1 22 C3 TPS<1%.  EGFR G719C, EGFR S768I, T p53, EGFR amplification, PIK3CA amplification, BRAF amplification, RB1 splice site SNV.  MSI-high not detected. - Afatinib 30 mg daily started on 05/18/2023, dose increased to 40 mg daily on 07/06/2023   2.  Social/family history: - He  lives by himself.  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  He had exposure to chemicals.  He quit smoking in 2012.  Smoked 1 pack/day starting age 68. - Maternal grandmother and maternal uncle had colon cancers.    Plan: 1.  Metastatic adenocarcinoma of the lung to the brain, bones and adrenals: - He is tolerating afatinib very well. - Rash has improved on the face.  Continue hydrocortisone cream. - Reviewed labs today: Normal LFTs.  Creatinine normal.  CBC grossly normal. - He reports that he is eating well.  However he lost 7 pounds since last visit. - Recommend follow-up in 2 weeks with PET scan to evaluate response.   2.  Brain metastasis: - He has multiple brain metastasis. - He has MRI brain scheduled in September.   3.  Diarrhea: - He has up to 3 watery stools per day. - Continue Lomotil up to 4 tablets/day.   4.  Bone metastasis: - We started him on Zometa a month ago which she tolerated well.  Calcium today is 8.2.  Proceed with Zometa today.   5.  Hypokalemia: - Continue K-Dur 20 milliequivalents daily.  Potassium 3.6 today.    Orders Placed This Encounter  Procedures   NM PET Image Restag (PS) Skull Base To Thigh    Standing Status:   Future    Standing Expiration Date:   08/08/2024    Order Specific Question:   If indicated for the ordered procedure, I authorize the administration of a radiopharmaceutical per Radiology protocol    Answer:   Yes  Order Specific Question:   Preferred imaging location?    Answer:   [name]      I,Katie Daubenspeck,acting as a Neurosurgeon for [employer], MD.,have documented all relevant documentation on the behalf of Theodore Massed, MD,as directed by  Theodore Massed, MD while in the presence of Theodore Massed, MD.   I, Theodore Massed MD, have reviewed the above documentation for accuracy and completeness,  and I agree with the above.   Theodore Massed, MD   8/28/20246:00 PM  CHIEF COMPLAINT:   Diagnosis: metastatic non-small cell adenocarcinoma    Cancer Staging  Lung cancer [facility]) Staging form: Lung, AJCC 8th Edition - Clinical stage from 04/24/2023: Stage IVB (cT1c, cN2, pM1c) - Signed by Theodore Massed, MD on 05/10/2023    Prior Therapy: palliative XRT to right scapula and right pelvis, 05/04/23 - 05/18/23  Current Therapy:  afatinib   HISTORY OF PRESENT ILLNESS:   Oncology History  Lung cancer (HCC)  04/10/2023 Initial Diagnosis   Lung cancer (HCC)   04/24/2023 Cancer Staging   Staging form: Lung, AJCC 8th Edition - Clinical stage from 04/24/2023: Stage IVB (cT1c, cN2, pM1c) - Signed by Theodore Massed, MD on 05/10/2023 Histopathologic type: Adenocarcinoma, NOS Stage prefix: Initial diagnosis      INTERVAL HISTORY:   Theodore [name] is a 67 y.o. male presenting to clinic today for follow up of metastatic non-small cell adenocarcinoma. He was last seen by me on 07/06/23.  Today, he states that he is doing well overall. His appetite level is at 80%. His energy level is at 20%.  PAST MEDICAL HISTORY:   Past Medical History: Past Medical History:  Diagnosis Date   COPD (chronic obstructive pulmonary disease) (HCC)     Surgical History: Past Surgical History:  Procedure Laterality Date   BRONCHIAL BIOPSY  04/18/2023   Procedure: BRONCHIAL BIOPSIES;  Surgeon: Josephine Igo, DO;  Location: [facility] ENDOSCOPY;  Service: Pulmonary;;   BRONCHIAL NEEDLE ASPIRATION BIOPSY  04/18/2023   Procedure: BRONCHIAL NEEDLE ASPIRATION BIOPSIES;  Surgeon: Josephine Igo, DO;  Location: [facility] ENDOSCOPY;  Service: Pulmonary;;   CHEST TUBE INSERTION Right 04/11/2023   Procedure: CHEST TUBE INSERTION;  Surgeon: Omar Person, MD;  Location: [facility] ENDOSCOPY;  Service: Pulmonary;  Laterality: Right;  fentanyl IV only   excision of cyst left ankle     MASS EXCISION Left 08/27/2018   Procedure:  EXCISION LIPOMA LEFT BUTTOCK;  Surgeon: Franky Macho, MD;  Location: [facility] ORS;  Service: General;  Laterality: Left;    Social History: Social History   Socioeconomic History   Marital status: Married    Spouse name: Not on file   Number of children: Not on file   Years of education: Not on file   Highest education level: Not on file  Occupational History   Not on file  Tobacco Use   Smoking status: Former    Current packs/day: 0.00    Types: Cigarettes    Quit date: 2012    Years since quitting: 12.6   Smokeless tobacco: Never  Vaping Use   Vaping status: Never Used  Substance and Sexual Activity   Alcohol use: Not Currently    Comment: Quit 1988   Drug use: Not Currently   Sexual activity: Yes    Birth control/protection: None  Other Topics Concern   Not on file  Social History Narrative   Not on file   Social Determinants of Health   Financial Resource Strain: Not on file  Food Insecurity: Food  Insecurity Present (05/02/2023)   Hunger Vital Sign    Worried About Running Out of Food in the Last Year: Never true    Ran Out of Food in the Last Year: Sometimes true  Transportation Needs: No Transportation Needs (05/02/2023)   PRAPARE - Administrator, Civil Service (Medical): No    Lack of Transportation (Non-Medical): No  Physical Activity: Not on file  Stress: Not on file  Social Connections: Not on file  Intimate Partner Violence: Not At Risk (05/02/2023)   Humiliation, Afraid, Rape, and Kick questionnaire    Fear of Current or Ex-Partner: No    Emotionally Abused: No    Physically Abused: No    Sexually Abused: No    Family History: Family History  Problem Relation Age of Onset   Colon cancer Maternal Grandmother    Colon cancer Maternal Uncle     Current Medications:  Current Outpatient Medications:    acetaminophen (TYLENOL) 325 MG tablet, Take 2 tablets (650 mg total) by mouth every 6 (six) hours as needed for mild pain, moderate pain,  headache or fever (or Fever >/= 101)., Disp: , Rfl:    afatinib dimaleate (GILOTRIF) 40 MG tablet, Take 1 tablet (40 mg total) by mouth daily. Take on an empty stomach 1hr before or 2hrs after meals., Disp: 30 tablet, Rfl: 3   albuterol (PROVENTIL HFA;VENTOLIN HFA) 108 (90 Base) MCG/ACT inhaler, Inhale 2 puffs into the lungs every 4 (four) hours as needed for wheezing or shortness of breath., Disp: , Rfl:    BREZTRI AEROSPHERE 160-9-4.8 MCG/ACT AERO, SMARTSIG:2 Puff(s) By Mouth Twice Daily, Disp: , Rfl:    clindamycin (CLINDAGEL) 1 % gel, Apply topically 2 (two) times daily., Disp: 30 g, Rfl: 0   diphenoxylate-atropine (LOMOTIL) 2.5-0.025 MG tablet, Take 1 tablet by mouth 4 (four) times daily as needed for diarrhea or loose stools., Disp: 60 tablet, Rfl: 1   folic acid (FOLVITE) 1 MG tablet, TAKE 1 TABLET BY MOUTH EVERY DAY, Disp: 30 tablet, Rfl: 1   HYDROCORTISONE, TOPICAL, 2.5 % SOLN, Apply topically twice daily., Disp: 30 mL, Rfl: 0   ibuprofen (ADVIL) 200 MG tablet, Take 400 mg by mouth every 6 (six) hours as needed for moderate pain., Disp: , Rfl:    LORazepam (ATIVAN) 0.5 MG tablet, 1 tab po 30 minutes prior to radiation or MRI scans, Disp: 30 tablet, Rfl: 0   potassium chloride SA (KLOR-CON M) 20 MEQ tablet, Take 1 tablet (20 mEq total) by mouth daily., Disp: 90 tablet, Rfl: 3   Allergies: No Active Allergies  REVIEW OF SYSTEMS:   Review of Systems  Constitutional:  Negative for chills, fatigue and fever.  HENT:   Negative for lump/mass, mouth sores, nosebleeds, sore throat and trouble swallowing.   Eyes:  Negative for eye problems.  Respiratory:  Positive for shortness of breath. Negative for cough.   Cardiovascular:  Negative for chest pain, leg swelling and palpitations.  Gastrointestinal:  Positive for diarrhea. Negative for abdominal pain, constipation, nausea and vomiting.  Genitourinary:  Negative for bladder incontinence, difficulty urinating, dysuria, frequency, hematuria and  nocturia.   Musculoskeletal:  Negative for arthralgias, back pain, flank pain, myalgias and neck pain.  Skin:  Negative for itching and rash.  Neurological:  Positive for numbness. Negative for dizziness and headaches.  Hematological:  Does not bruise/bleed easily.  Psychiatric/Behavioral:  Positive for depression. Negative for sleep disturbance and suicidal ideas. The patient is not nervous/anxious.   All other systems reviewed  and are negative.    VITALS:   There were no vitals taken for this visit.  Wt Readings from Last 3 Encounters:  07/06/23 120 lb 4.8 oz (54.6 kg)  06/01/23 122 lb 6.4 oz (55.5 kg)  05/10/23 122 lb 8 oz (55.6 kg)    There is no height or weight on file to calculate BMI.  Performance status (ECOG): 1 - Symptomatic but completely ambulatory  PHYSICAL EXAM:   Physical Exam Vitals and nursing note reviewed. Exam conducted with a chaperone present.  Constitutional:      Appearance: Normal appearance.  Cardiovascular:     Rate and Rhythm: Normal rate and regular rhythm.     Pulses: Normal pulses.     Heart sounds: Normal heart sounds.  Pulmonary:     Effort: Pulmonary effort is normal.     Breath sounds: Normal breath sounds.  Abdominal:     Palpations: Abdomen is soft. There is no hepatomegaly, splenomegaly or mass.     Tenderness: There is no abdominal tenderness.  Musculoskeletal:     Right lower leg: No edema.     Left lower leg: No edema.  Lymphadenopathy:     Cervical: No cervical adenopathy.     Right cervical: No superficial, deep or posterior cervical adenopathy.    Left cervical: No superficial, deep or posterior cervical adenopathy.     Upper Body:     Right upper body: No supraclavicular or axillary adenopathy.     Left upper body: No supraclavicular or axillary adenopathy.  Neurological:     General: No focal deficit present.     Mental Status: He is alert and oriented to person, place, and time.  Psychiatric:        Mood and Affect:  Mood normal.        Behavior: Behavior normal.     LABS:      Latest Ref Rng & Units 08/09/2023   10:36 AM 07/06/2023   10:56 AM 06/01/2023    8:47 AM  CBC  WBC 4.0 - 10.5 K/uL 6.6  6.3  8.7   Hemoglobin 13.0 - 17.0 g/dL 96.2  95.2  84.1   Hematocrit 39.0 - 52.0 % 40.4  43.2  44.2   Platelets 150 - 400 K/uL 287  301  309       Latest Ref Rng & Units 08/09/2023   10:36 AM 07/06/2023   10:56 AM 06/01/2023    8:47 AM  CMP  Glucose 70 - 99 mg/dL 324  401  027   BUN 8 - 23 mg/dL 10  8  6    Creatinine 0.61 - 1.24 mg/dL 2.53  6.64  4.03   Sodium 135 - 145 mmol/L 135  135  135   Potassium 3.5 - 5.1 mmol/L 3.6  4.0  3.1   Chloride 98 - 111 mmol/L 104  100  100   CO2 22 - 32 mmol/L 23  27  26    Calcium 8.9 - 10.3 mg/dL 8.2  8.8  8.7   Total Protein 6.5 - 8.1 g/dL 7.3  7.3  6.9   Total Bilirubin 0.3 - 1.2 mg/dL 1.0  0.9  0.6   Alkaline Phos 38 - 126 U/L 79  93  120   AST 15 - 41 U/L 16  24  24    ALT 0 - 44 U/L 22  38  34      No results found for: "CEA1", "CEA" / No results found for: "CEA1", "CEA" No  results found for: "PSA1" No results found for: "ZOX096" No results found for: "CAN125"  No results found for: "TOTALPROTELP", "ALBUMINELP", "A1GS", "A2GS", "BETS", "BETA2SER", "GAMS", "MSPIKE", "SPEI" No results found for: "TIBC", "FERRITIN", "IRONPCTSAT" Lab Results  Component Value Date   LDH 197 (H) 04/12/2023     STUDIES:   No results found.

## 2023-08-09 ENCOUNTER — Inpatient Hospital Stay: Payer: Medicare Other | Admitting: Hematology

## 2023-08-09 ENCOUNTER — Inpatient Hospital Stay: Payer: Medicare Other

## 2023-08-09 ENCOUNTER — Inpatient Hospital Stay: Payer: Medicare Other | Attending: Hematology

## 2023-08-09 DIAGNOSIS — C3490 Malignant neoplasm of unspecified part of unspecified bronchus or lung: Secondary | ICD-10-CM

## 2023-08-09 DIAGNOSIS — C3411 Malignant neoplasm of upper lobe, right bronchus or lung: Secondary | ICD-10-CM | POA: Insufficient documentation

## 2023-08-09 DIAGNOSIS — C7951 Secondary malignant neoplasm of bone: Secondary | ICD-10-CM

## 2023-08-09 DIAGNOSIS — R197 Diarrhea, unspecified: Secondary | ICD-10-CM | POA: Insufficient documentation

## 2023-08-09 DIAGNOSIS — C7931 Secondary malignant neoplasm of brain: Secondary | ICD-10-CM | POA: Diagnosis not present

## 2023-08-09 DIAGNOSIS — C7970 Secondary malignant neoplasm of unspecified adrenal gland: Secondary | ICD-10-CM | POA: Diagnosis not present

## 2023-08-09 DIAGNOSIS — E876 Hypokalemia: Secondary | ICD-10-CM | POA: Insufficient documentation

## 2023-08-09 DIAGNOSIS — C342 Malignant neoplasm of middle lobe, bronchus or lung: Secondary | ICD-10-CM

## 2023-08-09 DIAGNOSIS — Z79899 Other long term (current) drug therapy: Secondary | ICD-10-CM | POA: Insufficient documentation

## 2023-08-09 LAB — CBC WITH DIFFERENTIAL/PLATELET
Abs Immature Granulocytes: 0.02 10*3/uL (ref 0.00–0.07)
Basophils Absolute: 0 10*3/uL (ref 0.0–0.1)
Basophils Relative: 0 %
Eosinophils Absolute: 0.2 10*3/uL (ref 0.0–0.5)
Eosinophils Relative: 3 %
HCT: 40.4 % (ref 39.0–52.0)
Hemoglobin: 13.2 g/dL (ref 13.0–17.0)
Immature Granulocytes: 0 %
Lymphocytes Relative: 25 %
Lymphs Abs: 1.7 10*3/uL (ref 0.7–4.0)
MCH: 28.2 pg (ref 26.0–34.0)
MCHC: 32.7 g/dL (ref 30.0–36.0)
MCV: 86.3 fL (ref 80.0–100.0)
Monocytes Absolute: 0.7 10*3/uL (ref 0.1–1.0)
Monocytes Relative: 10 %
Neutro Abs: 4 10*3/uL (ref 1.7–7.7)
Neutrophils Relative %: 62 %
Platelets: 287 10*3/uL (ref 150–400)
RBC: 4.68 MIL/uL (ref 4.22–5.81)
RDW: 17.2 % — ABNORMAL HIGH (ref 11.5–15.5)
WBC: 6.6 10*3/uL (ref 4.0–10.5)
nRBC: 0 % (ref 0.0–0.2)

## 2023-08-09 LAB — COMPREHENSIVE METABOLIC PANEL
ALT: 22 U/L (ref 0–44)
AST: 16 U/L (ref 15–41)
Albumin: 3.6 g/dL (ref 3.5–5.0)
Alkaline Phosphatase: 79 U/L (ref 38–126)
Anion gap: 8 (ref 5–15)
BUN: 10 mg/dL (ref 8–23)
CO2: 23 mmol/L (ref 22–32)
Calcium: 8.2 mg/dL — ABNORMAL LOW (ref 8.9–10.3)
Chloride: 104 mmol/L (ref 98–111)
Creatinine, Ser: 0.77 mg/dL (ref 0.61–1.24)
GFR, Estimated: >60 mL/min (ref >60)
Glucose, Bld: 108 mg/dL — ABNORMAL HIGH (ref 70–99)
Potassium: 3.6 mmol/L (ref 3.5–5.1)
Sodium: 135 mmol/L (ref 135–145)
Total Bilirubin: 1 mg/dL (ref 0.3–1.2)
Total Protein: 7.3 g/dL (ref 6.5–8.1)

## 2023-08-09 LAB — MAGNESIUM: Magnesium: 2 mg/dL (ref 1.7–2.4)

## 2023-08-09 MED ORDER — ZOLEDRONIC ACID 4 MG/100ML IV SOLN
4.0000 mg | Freq: Once | INTRAVENOUS | Status: AC
  Administered 2023-08-09: 4 mg via INTRAVENOUS
  Filled 2023-08-09: qty 100

## 2023-08-09 MED ORDER — SODIUM CHLORIDE 0.9 % IV SOLN: Freq: Once | INTRAVENOUS | Status: AC

## 2023-08-09 NOTE — Patient Instructions (Signed)
[facility]  Discharge Instructions: Thank you for choosing [facility] to provide your oncology and hematology care.  If you have a lab appointment with the Cancer Center - please note that after April 8th, 2024, all labs will be drawn in the cancer center.  You do not have to check in or register with the main entrance as you have in the past but will complete your check-in in the cancer center.  Wear comfortable clothing and clothing appropriate for easy access to any Portacath or PICC line.   We strive to give you quality time with your provider. You may need to reschedule your appointment if you arrive late (15 or more minutes).  Arriving late affects you and other patients whose appointments are after yours.  Also, if you miss three or more appointments without notifying the office, you may be dismissed from the clinic at the provider's discretion.      For prescription refill requests, have your pharmacy contact our office and allow 72 hours for refills to be completed.    Today you received the following chemotherapy and/or immunotherapy agents Zometa      To help prevent nausea and vomiting after your treatment, we encourage you to take your nausea medication as directed.  BELOW ARE SYMPTOMS THAT SHOULD BE REPORTED IMMEDIATELY: *FEVER GREATER THAN 100.4 F (38 C) OR HIGHER *CHILLS OR SWEATING *NAUSEA AND VOMITING THAT IS NOT CONTROLLED WITH YOUR NAUSEA MEDICATION *UNUSUAL SHORTNESS OF BREATH *UNUSUAL BRUISING OR BLEEDING *URINARY PROBLEMS (pain or burning when urinating, or frequent urination) *BOWEL PROBLEMS (unusual diarrhea, constipation, pain near the anus) TENDERNESS IN MOUTH AND THROAT WITH OR WITHOUT PRESENCE OF ULCERS (sore throat, sores in mouth, or a toothache) UNUSUAL RASH, SWELLING OR PAIN  UNUSUAL VAGINAL DISCHARGE OR ITCHING   Items with * indicate a potential emergency and should be followed up as soon as possible or go to the  Emergency Department if any problems should occur.  Please show the CHEMOTHERAPY ALERT CARD or IMMUNOTHERAPY ALERT CARD at check-in to the Emergency Department and triage nurse.  Should you have questions after your visit or need to cancel or reschedule your appointment, please contact [facility] [phone]  and follow the prompts.  Office hours are 8:00 a.m. to 4:30 p.m. Monday - Friday. Please note that voicemails left after 4:00 p.m. may not be returned until the following business day.  We are closed weekends and major holidays. You have access to a nurse at all times for urgent questions. Please call the main number to the clinic [phone] and follow the prompts.  For any non-urgent questions, you may also contact your provider using MyChart. We now offer e-Visits for anyone 18 and older to request care online for non-urgent symptoms. For details visit [email or web address].   Also download the MyChart app! Go to the app store, search "MyChart", open the app, select [facility], and log in with your MyChart username and password.   

## 2023-08-09 NOTE — Progress Notes (Signed)
Patient Presents today for Zometa infusion per providers order.  Vital signs and labs reviewed by MD.  Message received from Peggye Fothergill RN/Dr. Ellin Saba patient okay for Zometa infusion.  Peripheral IV started blood return noted pre and post infusion.  Stable during infusion without adverse affects.  Vital signs stable.  No complaints at this time.  Discharge from clinic ambulatory in stable condition.  Alert and oriented X 3.  Follow up with [facility] as scheduled.

## 2023-08-09 NOTE — Progress Notes (Signed)
Patient is taking Gilotrif as prescribed.  He has not missed any doses and reports no side effects at this time.

## 2023-08-09 NOTE — Patient Instructions (Addendum)
[facility] Discharge Instructions   You were seen and examined today by Dr. Ellin Saba.  He reviewed the results of your lab work which are normal/stable.   Continue Gilotrif (chemo pill) as prescribed.   We will proceed with your Zometa infusion today.   We will see you back in 2 weeks. We will arrange for you to have a PET scan prior to this visit.   Return as scheduled.    Thank you for choosing [facility] to provide your oncology and hematology care.  To afford each patient quality time with our provider, please arrive at least 15 minutes before your scheduled appointment time.   If you have a lab appointment with the Cancer Center please come in thru the Main Entrance and check in at the main information desk.  You need to re-schedule your appointment should you arrive 10 or more minutes late.  We strive to give you quality time with our providers, and arriving late affects you and other patients whose appointments are after yours.  Also, if you no show three or more times for appointments you may be dismissed from the clinic at the providers discretion.     Again, thank you for choosing [facility].  Our hope is that these requests will decrease the amount of time that you wait before being seen by our physicians.       _____________________________________________________________  Should you have questions after your visit to [facility], please contact our office at [phone] and follow the prompts.  Our office hours are 8:00 a.m. and 4:30 p.m. Monday - Friday.  Please note that voicemails left after 4:00 p.m. may not be returned until the following business day.  We are closed weekends and major holidays.  You do have access to a nurse 24-7, just call the main number to the clinic [phone] and do not press any options, hold on the line and a nurse will answer the phone.     For prescription refill requests, have your pharmacy contact our office and allow 72 hours.    Due to Covid, you will need to wear a mask upon entering the hospital. If you do not have a mask, a mask will be given to you at the Main Entrance upon arrival. For doctor visits, patients may have 1 support person age 61 or older with them. For treatment visits, patients can not have anyone with them due to social distancing guidelines and our immunocompromised population.

## 2023-08-17 ENCOUNTER — Encounter (HOSPITAL_COMMUNITY)
Admission: RE | Admit: 2023-08-17 | Discharge: 2023-08-17 | Disposition: A | Discharge status: Home / Self Care | Payer: Medicare Other | Source: Ambulatory Visit | Attending: Hematology | Admitting: Hematology

## 2023-08-17 DIAGNOSIS — C3490 Malignant neoplasm of unspecified part of unspecified bronchus or lung: Secondary | ICD-10-CM | POA: Diagnosis present

## 2023-08-17 DIAGNOSIS — C7951 Secondary malignant neoplasm of bone: Secondary | ICD-10-CM | POA: Diagnosis present

## 2023-08-17 MED ORDER — FLUDEOXYGLUCOSE F - 18 (FDG) INJECTION
6.1000 | Freq: Once | INTRAVENOUS | Status: AC | PRN
  Administered 2023-08-17: 6.1 via INTRAVENOUS

## 2023-08-22 ENCOUNTER — Other Ambulatory Visit: Payer: Medicare Other

## 2023-08-23 ENCOUNTER — Other Ambulatory Visit: Payer: Self-pay

## 2023-08-23 NOTE — Progress Notes (Signed)
[facility] [address] [place] [address]    Clinic Day:  08/24/2023  Referring physician: Benita Stabile, MD  Patient Care Team: Benita Stabile, MD as PCP - General (Internal Medicine)   ASSESSMENT & PLAN:   Assessment: 1.  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 Sudie Bailey with a chest x-ray on 03/15/2023, with right pleural effusion. - CT chest (03/17/2023): Right middle lobe mass with complete collapse of the right middle lobe, moderate right pleural effusion, nodularity of the right minor fissure, lytic lesions of the right scapula and left third and fourth ribs.  Multiple enhancing liver lesions concerning for metastatic disease. - PET scan (04/06/2023): Right middle lobe primary bronchogenic carcinoma with mediastinal nodal, bone metastasis and probable upper abdominal nodal metastasis.  Right sided pneumothorax.  Metastasis with right acetabulum and proximal right femur.  Bilateral adrenal hypermetabolism with mild nodularity suspicious for early metastasis.  Right renal mass is not hypermetabolic.  No hepatic hypermetabolism. - Brain MRI (03/30/2023): 1.5 x 1.7 cm lesion in the left frontal lobe.  1.5 cm lesion in the high left frontal lobe.  5 mm lesion in the right frontal lobe.  Few other subcentimeter lesions, total of 10 reported. - Right pleural fluid cytology (04/11/2023): Malignant cells consistent with adenocarcinoma. - RML lung FNA (04/18/2023): Lung adenocarcinoma. - Guardant360: EGFR G719C, EGFR S768I.  MSI-high not detected. - [id number] tissue next (04/26/2023): PD-L1 22 C3 TPS<1%.  EGFR G719C, EGFR S768I, T p53, EGFR amplification, PIK3CA amplification, BRAF amplification, RB1 splice site SNV.  MSI-high not detected. - Afatinib 30 mg daily started on 05/18/2023, dose increased to 40 mg daily on 07/06/2023   2.  Social/family history: - He  lives by himself.  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  He had exposure to chemicals.  He quit smoking in 2012.  Smoked 1 pack/day starting age 49. - Maternal grandmother and maternal uncle had colon cancers.    Plan: 1.  Metastatic adenocarcinoma of the lung to the brain, bones and adrenals: - He is tolerating afatinib reasonably well. - He has lost 2 pounds since 07/06/2023.  He restarted drinking Ensure 1 can per day.  He is eating 3 small meals per day. - Labs from 08/09/2023: Normal LFTs and creatinine.  CBC grossly normal. - PET scan from 08/17/2023: Decrease size and metabolic activity of the central right middle lobe mass and associated right hilar and mediastinal adenopathy.  Bone lesions appear bigger but without residual hypermetabolic activity consistent with treated disease.  Recurrent large right pleural effusion with subtotal right lung collapse with no metabolic activity in the right pleural space.  No evidence of progression. - Recommend continuing afatinib 40 mg daily. - Recommend follow-up with our dietitian.  RTC 6 weeks for follow-up.   2.  Brain metastasis: - He has multiple brain metastasis.  He will have MRI repeated on 08/30/2023.   3.  Diarrhea: - He has up to 3 watery stools per day.  Continue Lomotil up to 4 tablets/day.   4.  Bone metastasis: - He does not report any jaw pains since the start of Zometa.  Calcium today is 8.2.  Albumin 3.6.  Continue Zometa monthly.   5.  Hypokalemia: - Continue K-Dur 20 milliequivalents daily.  Potassium is  3.6.    Orders Placed This Encounter  Procedures   CBC with Differential    Standing Status:   Future    Standing Expiration Date:   08/23/2024   Comprehensive metabolic panel    Standing Status:   Future    Standing Expiration Date:   08/23/2024   Magnesium    Standing Status:   Future    Standing Expiration Date:    08/23/2024      Mikeal Hawthorne R Teague,acting as a scribe for [name], MD.,have documented all relevant documentation on the behalf of [name], MD,as directed by  [name], MD while in the presence of [name], MD.  I, [name] MD, have reviewed the above documentation for accuracy and completeness, and I agree with the above.    [name], MD   9/12/20245:33 PM  CHIEF COMPLAINT:   Diagnosis: metastatic non-small cell adenocarcinoma    Cancer Staging  Lung cancer [facility]) Staging form: Lung, AJCC 8th Edition - Clinical stage from 04/24/2023: Stage IVB (cT1c, cN2, pM1c) - Signed by [name], MD on 05/10/2023    Prior Therapy: palliative XRT to right scapula and right pelvis, 05/04/23 - 05/18/23  Current Therapy:  afatinib   HISTORY OF PRESENT ILLNESS:   Oncology History  Lung cancer (HCC)  04/10/2023 Initial Diagnosis   Lung cancer (HCC)   04/24/2023 Cancer Staging   Staging form: Lung, AJCC 8th Edition - Clinical stage from 04/24/2023: Stage IVB (cT1c, cN2, pM1c) - Signed by [name], MD on 05/10/2023 Histopathologic type: Adenocarcinoma, NOS Stage prefix: Initial diagnosis      INTERVAL HISTORY:   Theodore [name] is a 67 y.o. male presenting to clinic today for follow up of metastatic non-small cell adenocarcinoma. He was last seen by me on 08/09/23.  Since his last visit, he underwent restaging PET on 08/17/23 that found: interval response to therapy with decreased size and metabolic activity of the central right middle lobe mass and associated right hilar and mediastinal adenopathy; widespread osseous metastatic disease has progressed in extent compared with previous PET-CT, without residual hypermetabolic activity, consistent with treated disease; recurrent large right pleural effusion with subtotal right lung collapse; no hypermetabolic activity identified within the right pleural space, consider  thoracentesis; and no evidence of progressive metastatic disease.  Today, he states that he is doing well overall. He is tolerating afatinib well. He has started drinking 1 Ensure a day since our last visit and eats 3 small meals a day. He has lost 2 pounds since 07/06/23. When he first started treatment, he had diarrhea and was not drinking Ensure. He has a brain MRI scheduled on 08/30/23.   PAST MEDICAL HISTORY:   Past Medical History: Past Medical History:  Diagnosis Date   COPD (chronic obstructive pulmonary disease) (HCC)     Surgical History: Past Surgical History:  Procedure Laterality Date   BRONCHIAL BIOPSY  04/18/2023   Procedure: BRONCHIAL BIOPSIES;  Surgeon: Josephine Igo, DO;  Location: [facility] ENDOSCOPY;  Service: Pulmonary;;   BRONCHIAL NEEDLE ASPIRATION BIOPSY  04/18/2023   Procedure: BRONCHIAL NEEDLE ASPIRATION BIOPSIES;  Surgeon: Josephine Igo, DO;  Location: [facility] ENDOSCOPY;  Service: Pulmonary;;   CHEST TUBE INSERTION Right 04/11/2023   Procedure: CHEST TUBE INSERTION;  Surgeon: Omar Person, MD;  Location: [facility]COPY;  Service: Pulmonary;  Laterality: Right;  fentanyl IV only   excision of cyst left ankle     MASS EXCISION Left 08/27/2018   Procedure: EXCISION LIPOMA LEFT BUTTOCK;  Surgeon:  Franky Macho, MD;  Location: [facility] ORS;  Service: General;  Laterality: Left;    Social History: Social History   Socioeconomic History   Marital status: Married    Spouse name: Not on file   Number of children: Not on file   Years of education: Not on file   Highest education level: Not on file  Occupational History   Not on file  Tobacco Use   Smoking status: Former    Current packs/day: 0.00    Types: Cigarettes    Quit date: 2012    Years since quitting: 12.7   Smokeless tobacco: Never  Vaping Use   Vaping status: Never Used  Substance and Sexual Activity   Alcohol use: Not Currently    Comment: Quit 1988   Drug use: Not Currently   Sexual activity: Yes     Birth control/protection: None  Other Topics Concern   Not on file  Social History Narrative   Not on file   Social Determinants of Health   Financial Resource Strain: Not on file  Food Insecurity: Food Insecurity Present (05/02/2023)   Hunger Vital Sign    Worried About Running Out of Food in the Last Year: Never true    Ran Out of Food in the Last Year: Sometimes true  Transportation Needs: No Transportation Needs (05/02/2023)   PRAPARE - Administrator, Civil Service (Medical): No    Lack of Transportation (Non-Medical): No  Physical Activity: Not on file  Stress: Not on file  Social Connections: Not on file  Intimate Partner Violence: Not At Risk (05/02/2023)   Humiliation, Afraid, Rape, and Kick questionnaire    Fear of Current or Ex-Partner: No    Emotionally Abused: No    Physically Abused: No    Sexually Abused: No    Family History: Family History  Problem Relation Age of Onset   Colon cancer Maternal Grandmother    Colon cancer Maternal Uncle     Current Medications:  Current Outpatient Medications:    acetaminophen (TYLENOL) 325 MG tablet, Take 2 tablets (650 mg total) by mouth every 6 (six) hours as needed for mild pain, moderate pain, headache or fever (or Fever >/= 101)., Disp: , Rfl:    afatinib dimaleate (GILOTRIF) 40 MG tablet, Take 1 tablet (40 mg total) by mouth daily. Take on an empty stomach 1hr before or 2hrs after meals., Disp: 30 tablet, Rfl: 3   albuterol (PROVENTIL HFA;VENTOLIN HFA) 108 (90 Base) MCG/ACT inhaler, Inhale 2 puffs into the lungs every 4 (four) hours as needed for wheezing or shortness of breath., Disp: , Rfl:    BREZTRI AEROSPHERE 160-9-4.8 MCG/ACT AERO, SMARTSIG:2 Puff(s) By Mouth Twice Daily, Disp: , Rfl:    clindamycin (CLINDAGEL) 1 % gel, Apply topically 2 (two) times daily., Disp: 30 g, Rfl: 0   diphenoxylate-atropine (LOMOTIL) 2.5-0.025 MG tablet, Take 1 tablet by mouth 4 (four) times daily as needed for diarrhea or  loose stools., Disp: 60 tablet, Rfl: 1   folic acid (FOLVITE) 1 MG tablet, TAKE 1 TABLET BY MOUTH EVERY DAY, Disp: 30 tablet, Rfl: 1   HYDROCORTISONE, TOPICAL, 2.5 % SOLN, Apply topically twice daily., Disp: 30 mL, Rfl: 0   ibuprofen (ADVIL) 200 MG tablet, Take 400 mg by mouth every 6 (six) hours as needed for moderate pain., Disp: , Rfl:    LORazepam (ATIVAN) 0.5 MG tablet, 1 tab po 30 minutes prior to radiation or MRI scans, Disp: 30 tablet, Rfl: 0  potassium chloride SA (KLOR-CON M) 20 MEQ tablet, Take 1 tablet (20 mEq total) by mouth daily., Disp: 90 tablet, Rfl: 3   Allergies: No Active Allergies  REVIEW OF SYSTEMS:   Review of Systems  Constitutional:  Negative for chills, fatigue and fever.  HENT:   Negative for lump/mass, mouth sores, nosebleeds, sore throat and trouble swallowing.   Eyes:  Negative for eye problems.  Respiratory:  Positive for shortness of breath (with exertion). Negative for cough.   Cardiovascular:  Negative for chest pain, leg swelling and palpitations.  Gastrointestinal:  Positive for diarrhea. Negative for abdominal pain, constipation, nausea and vomiting.  Genitourinary:  Negative for bladder incontinence, difficulty urinating, dysuria, frequency, hematuria and nocturia.   Musculoskeletal:  Negative for arthralgias, back pain, flank pain, myalgias and neck pain.  Skin:  Negative for itching and rash.  Neurological:  Positive for headaches and numbness (tingling in fingertips). Negative for dizziness.  Hematological:  Does not bruise/bleed easily.  Psychiatric/Behavioral:  Negative for depression, sleep disturbance and suicidal ideas. The patient is not nervous/anxious.   All other systems reviewed and are negative.    VITALS:   Blood pressure 120/72, pulse 87, temperature 98.1 F (36.7 C), temperature source Oral, resp. rate 17, weight 117 lb 14.4 oz (53.5 kg), SpO2 98%.  Wt Readings from Last 3 Encounters:  08/24/23 117 lb 14.4 oz (53.5 kg)   07/06/23 120 lb 4.8 oz (54.6 kg)  06/01/23 122 lb 6.4 oz (55.5 kg)    Body mass index is 17.41 kg/m.  Performance status (ECOG): 1 - Symptomatic but completely ambulatory  PHYSICAL EXAM:   Physical Exam Vitals and nursing note reviewed. Exam conducted with a chaperone present.  Constitutional:      Appearance: Normal appearance.  Cardiovascular:     Rate and Rhythm: Normal rate and regular rhythm.     Pulses: Normal pulses.     Heart sounds: Normal heart sounds.  Pulmonary:     Effort: Pulmonary effort is normal.     Breath sounds: Normal breath sounds.  Abdominal:     Palpations: Abdomen is soft. There is no hepatomegaly, splenomegaly or mass.     Tenderness: There is no abdominal tenderness.  Musculoskeletal:     Right lower leg: No edema.     Left lower leg: No edema.  Lymphadenopathy:     Cervical: No cervical adenopathy.     Right cervical: No superficial, deep or posterior cervical adenopathy.    Left cervical: No superficial, deep or posterior cervical adenopathy.     Upper Body:     Right upper body: No supraclavicular or axillary adenopathy.     Left upper body: No supraclavicular or axillary adenopathy.  Neurological:     General: No focal deficit present.     Mental Status: He is alert and oriented to person, place, and time.  Psychiatric:        Mood and Affect: Mood normal.        Behavior: Behavior normal.     LABS:      Latest Ref Rng & Units 08/09/2023   10:36 AM 07/06/2023   10:56 AM 06/01/2023    8:47 AM  CBC  WBC 4.0 - 10.5 K/uL 6.6  6.3  8.7   Hemoglobin 13.0 - 17.0 g/dL 40.9  81.1  91.4   Hematocrit 39.0 - 52.0 % 40.4  43.2  44.2   Platelets 150 - 400 K/uL 287  301  309  Latest Ref Rng & Units 08/09/2023   10:36 AM 07/06/2023   10:56 AM 06/01/2023    8:47 AM  CMP  Glucose 70 - 99 mg/dL 409  811  914   BUN 8 - 23 mg/dL 10  8  6    Creatinine 0.61 - 1.24 mg/dL 7.82  9.56  2.13   Sodium 135 - 145 mmol/L 135  135  135   Potassium 3.5  - 5.1 mmol/L 3.6  4.0  3.1   Chloride 98 - 111 mmol/L 104  100  100   CO2 22 - 32 mmol/L 23  27  26    Calcium 8.9 - 10.3 mg/dL 8.2  8.8  8.7   Total Protein 6.5 - 8.1 g/dL 7.3  7.3  6.9   Total Bilirubin 0.3 - 1.2 mg/dL 1.0  0.9  0.6   Alkaline Phos 38 - 126 U/L 79  93  120   AST 15 - 41 U/L 16  24  24    ALT 0 - 44 U/L 22  38  34      No results found for: "CEA1", "CEA" / No results found for: "CEA1", "CEA" No results found for: "PSA1" No results found for: "YQM578" No results found for: "CAN125"  No results found for: "TOTALPROTELP", "ALBUMINELP", "A1GS", "A2GS", "BETS", "BETA2SER", "GAMS", "MSPIKE", "SPEI" No results found for: "TIBC", "FERRITIN", "IRONPCTSAT" Lab Results  Component Value Date   LDH 197 (H) 04/12/2023     STUDIES:   NM PET Image Restag (PS) Skull Base To Thigh  Result Date: 08/24/2023 CLINICAL DATA:  Subsequent treatment strategy for metastatic lung cancer. EXAM: NUCLEAR MEDICINE PET SKULL BASE TO THIGH TECHNIQUE: 6.1 mCi F-18 FDG was injected intravenously. Full-ring PET imaging was performed from the skull base to thigh after the radiotracer. CT data was obtained and used for attenuation correction and anatomic localization. Fasting blood glucose: 97 mg/dl COMPARISON:  PET-CT 46/96/2952.  Chest CT 04/18/2023. FINDINGS: Mediastinal blood pool activity: SUV max 1.7 NECK: No hypermetabolic cervical lymph nodes are identified. No suspicious activity identified within the pharyngeal mucosal space. Incidental CT findings: Ethmoid and left maxillary sinus mucosal thickening with a possible air-level in the left maxillary sinus. CHEST: Recurrent large right pleural effusion with resulting near complete collapse of the right middle and lower lobes compared with the most recent chest CT. Previously demonstrated central right middle lobe mass has decreased in size and metabolic activity, currently within SUV max of 3.2 (previously 9.1). Associated hypermetabolic right hilar and  mediastinal adenopathy has nearly resolved. There is low-level metabolic activity within a small subcarinal lymph node (SUV max 2.3, previously 3.1). No new adenopathy. No hypermetabolic activity associated with the right pleural effusion. No hypermetabolic pulmonary activity or suspicious nodularity on the left. Incidental CT findings: As above, recurrent large right pleural effusion with associated subtotal right lung collapse. No residual pneumothorax. Aortic and coronary artery atherosclerosis. ABDOMEN/PELVIS: There is no hypermetabolic activity within the liver, adrenal glands, spleen or pancreas. There is no hypermetabolic nodal activity in the abdomen or pelvis. Incidental CT findings: Unchanged peripherally calcified 3.7 cm mass in the interpolar region of the right kidney, without associated hypermetabolic activity. Aortic and branch vessel atherosclerosis without evidence of aneurysm. Paucity of intra-abdominal fat limits assessment. SKELETON: Interval progression in widespread osseous metastatic disease without residual hypermetabolic activity, consistent with treated disease. Representative lesion superiorly in the L2 vertebral body measures 1.9 cm on image 95/202, previously approximately 5 mm. No hypermetabolic lesions identified. Incidental CT findings: Multilevel  spondylosis, most advanced at L5-S1. No evidence of pathologic fracture. Chronic fatty atrophy within the erector spinae musculature. IMPRESSION: 1. Interval response to therapy with decreased size and metabolic activity of the central right middle lobe mass and associated right hilar and mediastinal adenopathy. 2. Widespread osseous metastatic disease has progressed in extent compared with previous PET-CT, without residual hypermetabolic activity, consistent with treated disease. 3. Recurrent large right pleural effusion with subtotal right lung collapse. No hypermetabolic activity identified within the right pleural space. Consider  thoracentesis. 4. No evidence of progressive metastatic disease. 5.  Aortic Atherosclerosis (ICD10-I70.0). Electronically Signed   By: Carey Bullocks M.D.   On: 08/24/2023 09:07

## 2023-08-24 ENCOUNTER — Inpatient Hospital Stay: Payer: Medicare Other | Attending: Hematology | Admitting: Hematology

## 2023-08-24 VITALS — BP 120/72 | HR 87 | Temp 98.1°F | Resp 17 | Wt 117.9 lb

## 2023-08-24 DIAGNOSIS — C3490 Malignant neoplasm of unspecified part of unspecified bronchus or lung: Secondary | ICD-10-CM | POA: Diagnosis not present

## 2023-08-24 DIAGNOSIS — C342 Malignant neoplasm of middle lobe, bronchus or lung: Secondary | ICD-10-CM | POA: Insufficient documentation

## 2023-08-24 DIAGNOSIS — Z87891 Personal history of nicotine dependence: Secondary | ICD-10-CM | POA: Diagnosis not present

## 2023-08-24 DIAGNOSIS — C7931 Secondary malignant neoplasm of brain: Secondary | ICD-10-CM | POA: Diagnosis not present

## 2023-08-24 DIAGNOSIS — C7951 Secondary malignant neoplasm of bone: Secondary | ICD-10-CM | POA: Diagnosis present

## 2023-08-24 NOTE — Progress Notes (Signed)
Patient is taking afatinib as prescribed. He has not missed any doses and reports no side effects at this time.

## 2023-08-24 NOTE — Patient Instructions (Addendum)
[facility] at [facility] Discharge Instructions   You were seen and examined today by Dr. Ellin Saba.  He reviewed the results of your PET scan which looks good. The pills are working to help shrink and control the lung cancer.   Continue afatinib as prescribed.   We will see you back in 6 weeks. We will repeat lab work at that time.    Thank you for choosing [facility] at [facility] to provide your oncology and hematology care.  To afford each patient quality time with our provider, please arrive at least 15 minutes before your scheduled appointment time.   If you have a lab appointment with the Cancer Center please come in thru the Main Entrance and check in at the main information desk.  You need to re-schedule your appointment should you arrive 10 or more minutes late.  We strive to give you quality time with our providers, and arriving late affects you and other patients whose appointments are after yours.  Also, if you no show three or more times for appointments you may be dismissed from the clinic at the providers discretion.     Again, thank you for choosing [facility].  Our hope is that these requests will decrease the amount of time that you wait before being seen by our physicians.       _____________________________________________________________  Should you have questions after your visit to [facility], please contact our office at [phone] and follow the prompts.  Our office hours are 8:00 a.m. and 4:30 p.m. Monday - Friday.  Please note that voicemails left after 4:00 p.m. may not be returned until the following business day.  We are closed weekends and major holidays.  You do have access to a nurse 24-7, just call the main number to the clinic [phone] and do not press any options, hold on the line and a nurse will answer the phone.    For prescription refill requests, have your pharmacy  contact our office and allow 72 hours.    Due to Covid, you will need to wear a mask upon entering the hospital. If you do not have a mask, a mask will be given to you at the Main Entrance upon arrival. For doctor visits, patients may have 1 support person age 48 or older with them. For treatment visits, patients can not have anyone with them due to social distancing guidelines and our immunocompromised population.

## 2023-08-28 ENCOUNTER — Ambulatory Visit: Payer: Medicare Other | Admitting: Radiation Oncology

## 2023-08-28 ENCOUNTER — Inpatient Hospital Stay: Payer: Medicare Other

## 2023-08-30 ENCOUNTER — Ambulatory Visit
Admission: RE | Admit: 2023-08-30 | Discharge: 2023-08-30 | Disposition: A | Discharge status: Home / Self Care | Payer: Medicare Other | Source: Ambulatory Visit | Attending: Radiation Oncology | Admitting: Radiation Oncology

## 2023-08-30 DIAGNOSIS — C7931 Secondary malignant neoplasm of brain: Secondary | ICD-10-CM

## 2023-08-30 MED ORDER — GADOPICLENOL 0.5 MMOL/ML IV SOLN
6.0000 mL | Freq: Once | INTRAVENOUS | Status: AC | PRN
  Administered 2023-08-30: 6 mL via INTRAVENOUS

## 2023-09-04 ENCOUNTER — Encounter: Payer: Self-pay | Admitting: Radiation Oncology

## 2023-09-04 ENCOUNTER — Ambulatory Visit
Admission: RE | Admit: 2023-09-04 | Discharge: 2023-09-04 | Disposition: A | Discharge status: Home / Self Care | Payer: Medicare Other | Source: Ambulatory Visit | Attending: Radiation Oncology | Admitting: Radiation Oncology

## 2023-09-04 DIAGNOSIS — C7951 Secondary malignant neoplasm of bone: Secondary | ICD-10-CM

## 2023-09-04 DIAGNOSIS — C3491 Malignant neoplasm of unspecified part of right bronchus or lung: Secondary | ICD-10-CM

## 2023-09-04 NOTE — Progress Notes (Signed)
Telephone nursing appointment for review of most recent scan results. I verified patient's identity x2 and began nursing interview.   Patient reports mild SOB and insomnia but is doing well. No other issues conveyed at this time.   Meaningful use complete.   Patient aware of their 1:30pm telephone appointment w/ Laurence Aly PA-C. I left my extension [phone] in case patient needs anything. Patient verbalized understanding. This concludes the nursing interview.   Patient contact [phone]     Ruel Favors, LPN

## 2023-09-05 ENCOUNTER — Other Ambulatory Visit: Payer: Self-pay | Admitting: Radiation Therapy

## 2023-09-05 ENCOUNTER — Other Ambulatory Visit: Payer: Self-pay

## 2023-09-05 DIAGNOSIS — C7931 Secondary malignant neoplasm of brain: Secondary | ICD-10-CM

## 2023-09-05 DIAGNOSIS — C3490 Malignant neoplasm of unspecified part of unspecified bronchus or lung: Secondary | ICD-10-CM

## 2023-09-05 NOTE — Progress Notes (Signed)
Radiation Oncology         [phone] ________________________________  Outpatient follow up - Conducted via telephone at patient request.  I spoke with the patient to conduct this  visit via telephone. The patient was notified in advance and was offered an in person or telemedicine meeting to allow for face to face communication but instead preferred to proceed with a telephone follow up.   Name: Theodore [name]        MRN: [id number]  Date of Service: 09/04/2023 DOB: Dec 10, 1956  NW:GNFA, Kathleene Hazel, MD  Benita Stabile, MD     REFERRING PHYSICIAN: Benita Stabile, MD   DIAGNOSIS: The primary encounter diagnosis was Metastasis to bone [facility]). A diagnosis of Malignant neoplasm of right lung, unspecified part of lung (HCC) was also pertinent to this visit.   HISTORY OF PRESENT ILLNESS: Theodore [name] is a 67 y.o. 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 He was originally diagnosed after a CT scan  on 03/17/23 showed a 28 cm mass in the RML with associated complete collapse of the RML and nodularity of the right minor fissure. There were multiple enhancing liver lesions and a lytic change of the right scapula left 3 rd and 4th ribs. an MRI brain on 03/30/23 showed at least 11 called, but multiple brain metastases. A  PET scan on 04/06/2023 showed hypermetabolic activity within the right middle lobe, mediastinum including adenopathy extending into the upper abdominal chains, with osseous disease as well.  Hypermetabolic activity was seen in the right acetabulum, proximal right femur, bilateral adrenal glands, and right scapula as well as destruction involving the third and fourth anterolateral left ribs.  He underwent bronchoscopy on 04/18/2023, fine-needle aspirate of the right middle lobe showed a non-small cell carcinoma consistent with adenocarcinoma that was  EGFR mutated.  He went on to receive palliative radiation to the right lung, and right pelvis including the hip. He desired whole brain radiation in [place] and met with Dr. Langston Masker. He was encouraged to continue his Afatinib to see if this would address his brain disease prior to proceeding with whole brain radiation. He has tolerated this medication, and MRI was recommended for follow up. He proceeded with this on 08/30/23 that showed a response to therapy with only 2 remaining lesions that were significantly reduced in size. He's contacted to review these results.    PREVIOUS RADIATION THERAPY:      05/04/23-05/18/23 Plan Name: Chest_R Site: Scapula, Right Technique: Isodose Plan Mode: Photon Dose Per Fraction: 3 Gy Prescribed Dose (Delivered / Prescribed): 30 Gy / 30 Gy Prescribed Fxs (Delivered / Prescribed): 10 / 10   Plan Name: Pelvis_R Site: Hip, Right Technique: Isodose Plan Mode: Photon Dose Per Fraction: 3 Gy Prescribed Dose (Delivered / Prescribed): 30 Gy / 30 Gy Prescribed Fxs (Delivered / Prescribed): 10 / 10   PAST MEDICAL HISTORY:  Past Medical History:  Diagnosis Date   COPD (chronic obstructive pulmonary disease) (HCC)        PAST SURGICAL HISTORY: Past Surgical History:  Procedure Laterality Date   BRONCHIAL BIOPSY  04/18/2023   Procedure: BRONCHIAL BIOPSIES;  Surgeon: Josephine Igo, DO;  Location: [facility];  Service: Pulmonary;;   BRONCHIAL NEEDLE ASPIRATION BIOPSY  04/18/2023   Procedure: BRONCHIAL NEEDLE ASPIRATION BIOPSIES;  Surgeon: Josephine Igo, DO;  Location:  [facility];  Service: Pulmonary;;   CHEST TUBE INSERTION Right 04/11/2023   Procedure: CHEST TUBE INSERTION;  Surgeon: Omar Person, MD;  Location: [facility] ENDOSCOPY;  Service: Pulmonary;  Laterality: Right;  fentanyl IV only   excision of cyst left ankle     MASS EXCISION Left 08/27/2018   Procedure: EXCISION LIPOMA LEFT BUTTOCK;  Surgeon: Franky Macho, MD;  Location: [facility];  Service:  General;  Laterality: Left;     FAMILY HISTORY:  Family History  Problem Relation Age of Onset   Colon cancer Maternal Grandmother    Colon cancer Maternal Uncle      SOCIAL HISTORY:  reports that he quit smoking about 12 years ago. His smoking use included cigarettes. He has never used smokeless tobacco. He reports that he does not currently use alcohol. He reports that he does not currently use drugs. The patient is married and lives in [place].    ALLERGIES: Patient has no active allergies.   MEDICATIONS:  Current Outpatient Medications  Medication Sig Dispense Refill   acetaminophen (TYLENOL) 325 MG tablet Take 2 tablets (650 mg total) by mouth every 6 (six) hours as needed for mild pain, moderate pain, headache or fever (or Fever >/= 101).     afatinib dimaleate (GILOTRIF) 40 MG tablet Take 1 tablet (40 mg total) by mouth daily. Take on an empty stomach 1hr before or 2hrs after meals. 30 tablet 3   albuterol (PROVENTIL HFA;VENTOLIN HFA) 108 (90 Base) MCG/ACT inhaler Inhale 2 puffs into the lungs every 4 (four) hours as needed for wheezing or shortness of breath.     BREZTRI AEROSPHERE 160-9-4.8 MCG/ACT AERO SMARTSIG:2 Puff(s) By Mouth Twice Daily     clindamycin (CLINDAGEL) 1 % gel Apply topically 2 (two) times daily. 30 g 0   diphenoxylate-atropine (LOMOTIL) 2.5-0.025 MG tablet Take 1 tablet by mouth 4 (four) times daily as needed for diarrhea or loose stools. 60 tablet 1   folic acid (FOLVITE) 1 MG tablet TAKE 1 TABLET BY MOUTH EVERY DAY 30 tablet 1   HYDROCORTISONE, TOPICAL, 2.5 % SOLN Apply topically twice daily. 30 mL 0   ibuprofen (ADVIL) 200 MG tablet Take 400 mg by mouth every 6 (six) hours as needed for moderate pain.     LORazepam (ATIVAN) 0.5 MG tablet 1 tab po 30 minutes prior to radiation or MRI scans 30 tablet 0   potassium chloride SA (KLOR-CON M) 20 MEQ tablet Take 1 tablet (20 mEq total) by mouth daily. 90 tablet 3   No current facility-administered  medications for this encounter.     REVIEW OF SYSTEMS: On review of systems, the patient reports that he is doing well. He reports rare symptoms of headache or dizziness. He feels like he's doing very well and his main side effect he's navigating from his Afatinib is acne. He acknowledges his hip/pelvic and scapular pain have resolved since his prior radiation. No other complaints are verbalized.   PHYSICAL EXAM:  Unable to assess due to encounter type.    ECOG = 1  0 - Asymptomatic (Fully active, able to carry on all predisease activities without restriction)  1 - Symptomatic but completely ambulatory (Restricted in physically strenuous activity but ambulatory and able to carry out work of a light or sedentary nature. For example, light housework, office work)  2 - Symptomatic, <50% in bed during the day (Ambulatory and capable of all self care but unable to carry out any work activities. Up and about more than  50% of waking hours)  3 - Symptomatic, >50% in bed, but not bedbound (Capable of only limited self-care, confined to bed or chair 50% or more of waking hours)  4 - Bedbound (Completely disabled. Cannot carry on any self-care. Totally confined to bed or chair)  5 - Death   Santiago Glad MM, Creech RH, Tormey DC, et al. [phone]). "Toxicity and response criteria of the [facility]". Am. Evlyn Clines. Oncol. 5 (6): 649-55    LABORATORY DATA:  Lab Results  Component Value Date   WBC 6.6 08/09/2023   HGB 13.2 08/09/2023   HCT 40.4 08/09/2023   MCV 86.3 08/09/2023   PLT 287 08/09/2023   Lab Results  Component Value Date   NA 135 08/09/2023   K 3.6 08/09/2023   CL 104 08/09/2023   CO2 23 08/09/2023   Lab Results  Component Value Date   ALT 22 08/09/2023   AST 16 08/09/2023   ALKPHOS 79 08/09/2023   BILITOT 1.0 08/09/2023      RADIOGRAPHY: MR Brain W Wo Contrast  Result Date: 09/04/2023 CLINICAL DATA:  Follow-up whole-brain radiotherapy. EXAM: MRI HEAD  WITHOUT AND WITH CONTRAST TECHNIQUE: Multiplanar, multiecho pulse sequences of the brain and surrounding structures were obtained without and with intravenous contrast. CONTRAST:  6 cc of vueway intravenous COMPARISON:  05/09/2023 FINDINGS: Brain: Most of the patient's previously seen brain metastases have resolved on postcontrast imaging. Two areas of residual wispy enhancement are seen along the superior left frontal lobe with significant reduction in size, up to 11 mm on 13:108. No new metastasis, infarct, hydrocephalus, collection, or shift. Vascular: Major flow voids and vascular enhancements are preserved Skull and upper cervical spine: No focal marrow lesion. Upper cervical spine degeneration. Sinuses/Orbits: No significant finding IMPRESSION: Positive response to therapy, only 2 remaining metastases which are significantly reduced in size. No new lesion. Electronically Signed   By: Tiburcio Pea M.D.   On: 09/04/2023 08:43   NM PET Image Restag (PS) Skull Base To Thigh  Result Date: 08/24/2023 CLINICAL DATA:  Subsequent treatment strategy for metastatic lung cancer. EXAM: NUCLEAR MEDICINE PET SKULL BASE TO THIGH TECHNIQUE: 6.1 mCi F-18 FDG was injected intravenously. Full-ring PET imaging was performed from the skull base to thigh after the radiotracer. CT data was obtained and used for attenuation correction and anatomic localization. Fasting blood glucose: 97 mg/dl COMPARISON:  PET-CT 59/56/3875.  Chest CT 04/18/2023. FINDINGS: Mediastinal blood pool activity: SUV max 1.7 NECK: No hypermetabolic cervical lymph nodes are identified. No suspicious activity identified within the pharyngeal mucosal space. Incidental CT findings: Ethmoid and left maxillary sinus mucosal thickening with a possible air-level in the left maxillary sinus. CHEST: Recurrent large right pleural effusion with resulting near complete collapse of the right middle and lower lobes compared with the most recent chest CT. Previously  demonstrated central right middle lobe mass has decreased in size and metabolic activity, currently within SUV max of 3.2 (previously 9.1). Associated hypermetabolic right hilar and mediastinal adenopathy has nearly resolved. There is low-level metabolic activity within a small subcarinal lymph node (SUV max 2.3, previously 3.1). No new adenopathy. No hypermetabolic activity associated with the right pleural effusion. No hypermetabolic pulmonary activity or suspicious nodularity on the left. Incidental CT findings: As above, recurrent large right pleural effusion with associated subtotal right lung collapse. No residual pneumothorax. Aortic and coronary artery atherosclerosis. ABDOMEN/PELVIS: There is no hypermetabolic activity within the liver, adrenal glands, spleen or pancreas. There is no hypermetabolic nodal activity in  the abdomen or pelvis. Incidental CT findings: Unchanged peripherally calcified 3.7 cm mass in the interpolar region of the right kidney, without associated hypermetabolic activity. Aortic and branch vessel atherosclerosis without evidence of aneurysm. Paucity of intra-abdominal fat limits assessment. SKELETON: Interval progression in widespread osseous metastatic disease without residual hypermetabolic activity, consistent with treated disease. Representative lesion superiorly in the L2 vertebral body measures 1.9 cm on image 95/202, previously approximately 5 mm. No hypermetabolic lesions identified. Incidental CT findings: Multilevel spondylosis, most advanced at L5-S1. No evidence of pathologic fracture. Chronic fatty atrophy within the erector spinae musculature. IMPRESSION: 1. Interval response to therapy with decreased size and metabolic activity of the central right middle lobe mass and associated right hilar and mediastinal adenopathy. 2. Widespread osseous metastatic disease has progressed in extent compared with previous PET-CT, without residual hypermetabolic activity, consistent  with treated disease. 3. Recurrent large right pleural effusion with subtotal right lung collapse. No hypermetabolic activity identified within the right pleural space. Consider thoracentesis. 4. No evidence of progressive metastatic disease. 5.  Aortic Atherosclerosis (ICD10-I70.0). Electronically Signed   By: Carey Bullocks M.D.   On: 08/24/2023 09:07       IMPRESSION/PLAN: 1. 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 The patient appears to be doing very well radiographically regarding the brain. He also appears to have  improvement in his disease based as well in my review of his recent PET scan. We will continue to follow him closely and proceed with another MRI brain in 3-4 months. He will continue Afatinib and follow with Dr. Ellin Saba.   2.    Claustrophobia. The patient continues to use Ativan prior to 3T MRIs.     This encounter was conducted via telephone.  The patient has provided two factor identification and has given verbal consent for this type of encounter and has been advised to only accept a meeting of this type in a secure network environment. The time spent during this encounter was 35 minutes including preparation, discussion, and coordination of the patient's care. The attendants for this meeting include  Ronny Bacon  and Marca Ancona.  During the encounter,   Ronny Bacon was located remotely ASLAN BURDITT was located at home.     Osker Mason, [facility]   **Disclaimer: This note was dictated with voice recognition software. Similar sounding words can inadvertently be transcribed and this note may contain transcription errors which may not have been corrected upon publication of note.**

## 2023-09-06 ENCOUNTER — Inpatient Hospital Stay: Payer: Medicare Other

## 2023-09-06 VITALS — BP 115/81 | HR 78 | Temp 96.6°F | Resp 18 | Wt 116.0 lb

## 2023-09-06 DIAGNOSIS — C3490 Malignant neoplasm of unspecified part of unspecified bronchus or lung: Secondary | ICD-10-CM

## 2023-09-06 DIAGNOSIS — C7951 Secondary malignant neoplasm of bone: Secondary | ICD-10-CM

## 2023-09-06 LAB — COMPREHENSIVE METABOLIC PANEL
ALT: 34 U/L (ref 0–44)
AST: 19 U/L (ref 15–41)
Albumin: 3.8 g/dL (ref 3.5–5.0)
Alkaline Phosphatase: 90 U/L (ref 38–126)
Anion gap: 8 (ref 5–15)
BUN: 13 mg/dL (ref 8–23)
CO2: 26 mmol/L (ref 22–32)
Calcium: 8.6 mg/dL — ABNORMAL LOW (ref 8.9–10.3)
Chloride: 100 mmol/L (ref 98–111)
Creatinine, Ser: 0.75 mg/dL (ref 0.61–1.24)
GFR, Estimated: >60 mL/min (ref >60)
Glucose, Bld: 105 mg/dL — ABNORMAL HIGH (ref 70–99)
Potassium: 4 mmol/L (ref 3.5–5.1)
Sodium: 134 mmol/L — ABNORMAL LOW (ref 135–145)
Total Bilirubin: 0.8 mg/dL (ref 0.3–1.2)
Total Protein: 7.5 g/dL (ref 6.5–8.1)

## 2023-09-06 MED ORDER — ZOLEDRONIC ACID 4 MG/100ML IV SOLN
4.0000 mg | Freq: Once | INTRAVENOUS | Status: AC
  Administered 2023-09-06: 4 mg via INTRAVENOUS
  Filled 2023-09-06: qty 100

## 2023-09-06 MED ORDER — SODIUM CHLORIDE 0.9 % IV SOLN: Freq: Once | INTRAVENOUS | Status: AC

## 2023-09-06 NOTE — Progress Notes (Signed)
Patient presents today for Zometa 4 mg IV per provider's order. Vital signs stable and pt voiced no new complaints at this time.  Peripheral IV started with good blood return pre and post infusion.  Treatment given today per MD orders. Tolerated infusion without adverse affects. Vital signs stable. No complaints at this time. Discharged from clinic ambulatory with walker in stable condition. Alert and oriented x 3. F/U with [facility] as scheduled.

## 2023-09-06 NOTE — Patient Instructions (Signed)
[facility]  Discharge Instructions: Thank you for choosing [facility] to provide your oncology and hematology care.  If you have a lab appointment with the Cancer Center - please note that after April 8th, 2024, all labs will be drawn in the cancer center.  You do not have to check in or register with the main entrance as you have in the past but will complete your check-in in the cancer center.  Wear comfortable clothing and clothing appropriate for easy access to any Portacath or PICC line.   We strive to give you quality time with your provider. You may need to reschedule your appointment if you arrive late (15 or more minutes).  Arriving late affects you and other patients whose appointments are after yours.  Also, if you miss three or more appointments without notifying the office, you may be dismissed from the clinic at the provider's discretion.      For prescription refill requests, have your pharmacy contact our office and allow 72 hours for refills to be completed.    Today you received Zometa 4mg  IV      BELOW ARE SYMPTOMS THAT SHOULD BE REPORTED IMMEDIATELY: *FEVER GREATER THAN 100.4 F (38 C) OR HIGHER *CHILLS OR SWEATING *NAUSEA AND VOMITING THAT IS NOT CONTROLLED WITH YOUR NAUSEA MEDICATION *UNUSUAL SHORTNESS OF BREATH *UNUSUAL BRUISING OR BLEEDING *URINARY PROBLEMS (pain or burning when urinating, or frequent urination) *BOWEL PROBLEMS (unusual diarrhea, constipation, pain near the anus) TENDERNESS IN MOUTH AND THROAT WITH OR WITHOUT PRESENCE OF ULCERS (sore throat, sores in mouth, or a toothache) UNUSUAL RASH, SWELLING OR PAIN  UNUSUAL VAGINAL DISCHARGE OR ITCHING   Items with * indicate a potential emergency and should be followed up as soon as possible or go to the Emergency Department if any problems should occur.  Please show the CHEMOTHERAPY ALERT CARD or IMMUNOTHERAPY ALERT CARD at check-in to the Emergency Department and triage  nurse.  Should you have questions after your visit or need to cancel or reschedule your appointment, please contact [facility] [phone]  and follow the prompts.  Office hours are 8:00 a.m. to 4:30 p.m. Monday - Friday. Please note that voicemails left after 4:00 p.m. may not be returned until the following business day.  We are closed weekends and major holidays. You have access to a nurse at all times for urgent questions. Please call the main number to the clinic [phone] and follow the prompts.  For any non-urgent questions, you may also contact your provider using MyChart. We now offer e-Visits for anyone 28 and older to request care online for non-urgent symptoms. For details visit [email or web address].   Also download the MyChart app! Go to the app store, search "MyChart", open the app, select [facility], and log in with your MyChart username and password.

## 2023-09-07 ENCOUNTER — Telehealth: Payer: Self-pay | Admitting: Radiation Therapy

## 2023-09-07 NOTE — Telephone Encounter (Signed)
I spoke with Mr. Skluzacek about the brain MRI and telephone follow-up with Jill Side in January. He has this information written down and plans to attend.   Jalene Mullet R.T.(R)(T) Radiation Special Procedures Navigator

## 2023-09-18 ENCOUNTER — Other Ambulatory Visit: Payer: Self-pay

## 2023-09-18 NOTE — Progress Notes (Signed)
Specialty Pharmacy Refill Coordination Note  Theodore [name] is a 67 y.o. male contacted today regarding refills of specialty medication(s) Afatinib Dimaleate   Patient requested Delivery   Delivery date: 09/26/23   Verified address: [address]   [place] [address]   Medication will be filled on 09/25/23.

## 2023-09-25 ENCOUNTER — Other Ambulatory Visit: Payer: Self-pay | Admitting: Hematology

## 2023-10-04 ENCOUNTER — Inpatient Hospital Stay: Payer: Medicare Other

## 2023-10-04 ENCOUNTER — Inpatient Hospital Stay: Payer: Medicare Other | Admitting: Hematology

## 2023-10-04 ENCOUNTER — Inpatient Hospital Stay: Payer: Medicare Other | Attending: Hematology

## 2023-10-04 VITALS — BP 122/79 | HR 81 | Temp 97.3°F | Resp 18 | Wt 117.9 lb

## 2023-10-04 DIAGNOSIS — Z87891 Personal history of nicotine dependence: Secondary | ICD-10-CM | POA: Diagnosis not present

## 2023-10-04 DIAGNOSIS — C3490 Malignant neoplasm of unspecified part of unspecified bronchus or lung: Secondary | ICD-10-CM

## 2023-10-04 DIAGNOSIS — C342 Malignant neoplasm of middle lobe, bronchus or lung: Secondary | ICD-10-CM | POA: Insufficient documentation

## 2023-10-04 DIAGNOSIS — E876 Hypokalemia: Secondary | ICD-10-CM | POA: Diagnosis not present

## 2023-10-04 DIAGNOSIS — C7931 Secondary malignant neoplasm of brain: Secondary | ICD-10-CM | POA: Diagnosis not present

## 2023-10-04 DIAGNOSIS — Z79899 Other long term (current) drug therapy: Secondary | ICD-10-CM | POA: Insufficient documentation

## 2023-10-04 DIAGNOSIS — C7951 Secondary malignant neoplasm of bone: Secondary | ICD-10-CM | POA: Insufficient documentation

## 2023-10-04 LAB — COMPREHENSIVE METABOLIC PANEL
ALT: 33 U/L (ref 0–44)
AST: 21 U/L (ref 15–41)
Albumin: 3.7 g/dL (ref 3.5–5.0)
Alkaline Phosphatase: 118 U/L (ref 38–126)
Anion gap: 10 (ref 5–15)
BUN: 12 mg/dL (ref 8–23)
CO2: 26 mmol/L (ref 22–32)
Calcium: 8.6 mg/dL — ABNORMAL LOW (ref 8.9–10.3)
Chloride: 100 mmol/L (ref 98–111)
Creatinine, Ser: 0.7 mg/dL (ref 0.61–1.24)
GFR, Estimated: >60 mL/min (ref >60)
Glucose, Bld: 96 mg/dL (ref 70–99)
Potassium: 3.8 mmol/L (ref 3.5–5.1)
Sodium: 136 mmol/L (ref 135–145)
Total Bilirubin: 0.6 mg/dL (ref 0.3–1.2)
Total Protein: 8 g/dL (ref 6.5–8.1)

## 2023-10-04 LAB — CBC WITH DIFFERENTIAL/PLATELET
Abs Immature Granulocytes: 0.01 10*3/uL (ref 0.00–0.07)
Basophils Absolute: 0 10*3/uL (ref 0.0–0.1)
Basophils Relative: 0 %
Eosinophils Absolute: 0.2 10*3/uL (ref 0.0–0.5)
Eosinophils Relative: 2 %
HCT: 43 % (ref 39.0–52.0)
Hemoglobin: 14.2 g/dL (ref 13.0–17.0)
Immature Granulocytes: 0 %
Lymphocytes Relative: 18 %
Lymphs Abs: 1.2 10*3/uL (ref 0.7–4.0)
MCH: 29.3 pg (ref 26.0–34.0)
MCHC: 33 g/dL (ref 30.0–36.0)
MCV: 88.7 fL (ref 80.0–100.0)
Monocytes Absolute: 0.6 10*3/uL (ref 0.1–1.0)
Monocytes Relative: 10 %
Neutro Abs: 4.6 10*3/uL (ref 1.7–7.7)
Neutrophils Relative %: 70 %
Platelets: 313 10*3/uL (ref 150–400)
RBC: 4.85 MIL/uL (ref 4.22–5.81)
RDW: 15.6 % — ABNORMAL HIGH (ref 11.5–15.5)
WBC: 6.6 10*3/uL (ref 4.0–10.5)
nRBC: 0 % (ref 0.0–0.2)

## 2023-10-04 LAB — MAGNESIUM: Magnesium: 2 mg/dL (ref 1.7–2.4)

## 2023-10-04 MED ORDER — ZOLEDRONIC ACID 4 MG/100ML IV SOLN
4.0000 mg | Freq: Once | INTRAVENOUS | Status: AC
  Administered 2023-10-04: 4 mg via INTRAVENOUS
  Filled 2023-10-04: qty 100

## 2023-10-04 MED ORDER — SODIUM CHLORIDE 0.9 % IV SOLN: Freq: Once | INTRAVENOUS | Status: AC

## 2023-10-04 NOTE — Progress Notes (Signed)
[facility] [address], [place] [address]    Clinic Day:  10/04/2023  Referring physician: Benita Stabile, MD  Patient Care Team: Benita Stabile, MD as PCP - General (Internal Medicine)   ASSESSMENT & PLAN:   Assessment: 1.  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 Sudie Bailey with a chest x-ray on 03/15/2023, with right pleural effusion. - CT chest (03/17/2023): Right middle lobe mass with complete collapse of the right middle lobe, moderate right pleural effusion, nodularity of the right minor fissure, lytic lesions of the right scapula and left third and fourth ribs.  Multiple enhancing liver lesions concerning for metastatic disease. - PET scan (04/06/2023): Right middle lobe primary bronchogenic carcinoma with mediastinal nodal, bone metastasis and probable upper abdominal nodal metastasis.  Right sided pneumothorax.  Metastasis with right acetabulum and proximal right femur.  Bilateral adrenal hypermetabolism with mild nodularity suspicious for early metastasis.  Right renal mass is not hypermetabolic.  No hepatic hypermetabolism. - Brain MRI (03/30/2023): 1.5 x 1.7 cm lesion in the left frontal lobe.  1.5 cm lesion in the high left frontal lobe.  5 mm lesion in the right frontal lobe.  Few other subcentimeter lesions, total of 10 reported. - Right pleural fluid cytology (04/11/2023): Malignant cells consistent with adenocarcinoma. - RML lung FNA (04/18/2023): Lung adenocarcinoma. - Guardant360: EGFR G719C, EGFR S768I.  MSI-high not detected. - VFIEPPIR518 tissue next (04/26/2023): PD-L1 22 C3 TPS<1%.  EGFR G719C, EGFR S768I, T p53, EGFR amplification, PIK3CA amplification, BRAF amplification, RB1 splice site SNV.  MSI-high not detected. - Afatinib 30 mg daily started on 05/18/2023, dose increased to 40 mg daily on 07/06/2023   2.  Social/family history: - He  lives by himself.  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  He had exposure to chemicals.  He quit smoking in 2012.  Smoked 1 pack/day starting age 63. - Maternal grandmother and maternal uncle had colon cancers.    Plan: 1.  Metastatic adenocarcinoma of the lung to the brain, bones and adrenals: - PET scan on 08/17/2023: Decrease in size and metabolic activity of the central right middle lobe mass and associated right hilar and mediastinal adenopathy.  Bone lesions appear bigger but without residual metabolic activity consistent with treated disease.  Recurrent large right pleural effusion with subtotal right lung collapse with no metabolic activity in the right pleural space. - He is tolerating afatinib reasonably well. - Reviewed labs today: Normal LFTs and creatinine.  CBC grossly normal. - Chronic back pain is stable.  No GI side effects. - Recommend continuing afatinib 40 mg daily.  RTC 8 weeks for follow-up.  Will repeat PET scan prior to next visit.   2.  Brain metastasis: - Reviewed MRI from 08/30/2023: Positive response to therapy with only 2 remaining metastatic disease significantly reduced in size.  He will have another MRI done in January.   3.  Diarrhea: - He reports diarrhea improved in the last 4 weeks.  He is not requiring Lomotil.   4.  Bone metastasis: - Calcium is 8.6 with albumin 3.7.  Continue Zometa today and every 4 weeks.   5.  Hypokalemia: - Continue K-Dur 20 mill equivalents daily.  Potassium is normal today.    No orders of the defined types were  placed in this encounter.      [name], MD   10/23/202412:48 PM  CHIEF COMPLAINT:   Diagnosis: metastatic non-small cell adenocarcinoma    Cancer Staging  Lung cancer [facility]) Staging form: Lung, AJCC 8th Edition - Clinical stage from 04/24/2023: Stage IVB (cT1c, cN2, pM1c) - Signed by [name], MD on 05/10/2023    Prior Therapy: palliative XRT to right scapula and right pelvis, 05/04/23 - 05/18/23  Current Therapy:  afatinib   HISTORY OF PRESENT ILLNESS:   Oncology History  Lung cancer (HCC)  04/10/2023 Initial Diagnosis   Lung cancer (HCC)   04/24/2023 Cancer Staging   Staging form: Lung, AJCC 8th Edition - Clinical stage from 04/24/2023: Stage IVB (cT1c, cN2, pM1c) - Signed by [name], MD on 05/10/2023 Histopathologic type: Adenocarcinoma, NOS Stage prefix: Initial diagnosis      INTERVAL HISTORY:   Theodore [name] is a 67 y.o. male here for follow-up of metastatic lung cancer. He reports appetite 90% and energy levels are 80%.  PAST MEDICAL HISTORY:   Past Medical History: Past Medical History:  Diagnosis Date   COPD (chronic obstructive pulmonary disease) (HCC)     Surgical History: Past Surgical History:  Procedure Laterality Date   BRONCHIAL BIOPSY  04/18/2023   Procedure: BRONCHIAL BIOPSIES;  Surgeon: Josephine Igo, DO;  Location: [facility] ENDOSCOPY;  Service: Pulmonary;;   BRONCHIAL NEEDLE ASPIRATION BIOPSY  04/18/2023   Procedure: BRONCHIAL NEEDLE ASPIRATION BIOPSIES;  Surgeon: Josephine Igo, DO;  Location: [facility] ENDOSCOPY;  Service: Pulmonary;;   CHEST TUBE INSERTION Right 04/11/2023   Procedure: CHEST TUBE INSERTION;  Surgeon: Omar Person, MD;  Location: [facility] ENDOSCOPY;  Service: Pulmonary;  Laterality: Right;  fentanyl IV only   excision of cyst left ankle     MASS EXCISION Left 08/27/2018   Procedure: EXCISION LIPOMA LEFT BUTTOCK;  Surgeon: Franky Macho, MD;  Location: [facility] ORS;  Service: General;  Laterality: Left;    Social History: Social History   Socioeconomic History   Marital status: Married    Spouse name: Not on file   Number of children: Not on file   Years of education: Not on file   Highest education level: Not on file  Occupational History   Not on file  Tobacco Use   Smoking status: Former    Current packs/day: 0.00     Types: Cigarettes    Quit date: 2012    Years since quitting: 12.8   Smokeless tobacco: Never  Vaping Use   Vaping status: Never Used  Substance and Sexual Activity   Alcohol use: Not Currently    Comment: Quit 1988   Drug use: Not Currently   Sexual activity: Yes    Birth control/protection: None  Other Topics Concern   Not on file  Social History Narrative   Not on file   Social Determinants of Health   Financial Resource Strain: Not on file  Food Insecurity: Food Insecurity Present (05/02/2023)   Hunger Vital Sign    Worried About Running Out of Food in the Last Year: Never true    Ran Out of Food in the Last Year: Sometimes true  Transportation Needs: No Transportation Needs (05/02/2023)   PRAPARE - Administrator, Civil Service (Medical): No    Lack of Transportation (Non-Medical): No  Physical Activity: Not on file  Stress: Not on file  Social Connections: Not on file  Intimate Partner Violence: Not At Risk (05/02/2023)   Humiliation, Afraid, Rape, and  Kick questionnaire    Fear of Current or Ex-Partner: No    Emotionally Abused: No    Physically Abused: No    Sexually Abused: No    Family History: Family History  Problem Relation Age of Onset   Colon cancer Maternal Grandmother    Colon cancer Maternal Uncle     Current Medications:  Current Outpatient Medications:    acetaminophen (TYLENOL) 325 MG tablet, Take 2 tablets (650 mg total) by mouth every 6 (six) hours as needed for mild pain, moderate pain, headache or fever (or Fever >/= 101)., Disp: , Rfl:    afatinib dimaleate (GILOTRIF) 40 MG tablet, Take 1 tablet (40 mg total) by mouth daily. Take on an empty stomach 1hr before or 2hrs after meals., Disp: 30 tablet, Rfl: 3   albuterol (PROVENTIL HFA;VENTOLIN HFA) 108 (90 Base) MCG/ACT inhaler, Inhale 2 puffs into the lungs every 4 (four) hours as needed for wheezing or shortness of breath., Disp: , Rfl:    BREZTRI AEROSPHERE 160-9-4.8 MCG/ACT  AERO, SMARTSIG:2 Puff(s) By Mouth Twice Daily, Disp: , Rfl:    clindamycin (CLINDAGEL) 1 % gel, Apply topically 2 (two) times daily., Disp: 30 g, Rfl: 0   diphenoxylate-atropine (LOMOTIL) 2.5-0.025 MG tablet, Take 1 tablet by mouth 4 (four) times daily as needed for diarrhea or loose stools., Disp: 60 tablet, Rfl: 1   folic acid (FOLVITE) 1 MG tablet, TAKE 1 TABLET BY MOUTH EVERY DAY, Disp: 30 tablet, Rfl: 1   HYDROCORTISONE, TOPICAL, 2.5 % SOLN, Apply topically twice daily., Disp: 30 mL, Rfl: 0   ibuprofen (ADVIL) 200 MG tablet, Take 400 mg by mouth every 6 (six) hours as needed for moderate pain., Disp: , Rfl:    LORazepam (ATIVAN) 0.5 MG tablet, 1 tab po 30 minutes prior to radiation or MRI scans, Disp: 30 tablet, Rfl: 0   potassium chloride SA (KLOR-CON M) 20 MEQ tablet, Take 1 tablet (20 mEq total) by mouth daily., Disp: 90 tablet, Rfl: 3   Allergies: No Active Allergies  REVIEW OF SYSTEMS:   Review of Systems  Constitutional:  Negative for chills, fatigue and fever.  HENT:   Negative for lump/mass, mouth sores, nosebleeds, sore throat and trouble swallowing.   Eyes:  Negative for eye problems.  Respiratory:  Positive for shortness of breath (with exertion). Negative for cough.   Cardiovascular:  Negative for chest pain, leg swelling and palpitations.  Gastrointestinal:  Negative for abdominal pain, constipation, nausea and vomiting.  Genitourinary:  Negative for bladder incontinence, difficulty urinating, dysuria, frequency, hematuria and nocturia.   Musculoskeletal:  Positive for back pain. Negative for arthralgias, flank pain, myalgias and neck pain.  Skin:  Negative for itching and rash.  Neurological:  Positive for numbness (tingling in fingertips). Negative for dizziness.  Hematological:  Does not bruise/bleed easily.  Psychiatric/Behavioral:  Positive for depression. Negative for sleep disturbance and suicidal ideas. The patient is nervous/anxious.   All other systems reviewed  and are negative.    VITALS:   There were no vitals taken for this visit.  Wt Readings from Last 3 Encounters:  09/06/23 116 lb (52.6 kg)  08/24/23 117 lb 14.4 oz (53.5 kg)  07/06/23 120 lb 4.8 oz (54.6 kg)    There is no height or weight on file to calculate BMI.  Performance status (ECOG): 1 - Symptomatic but completely ambulatory  PHYSICAL EXAM:   Physical Exam Vitals and nursing note reviewed. Exam conducted with a chaperone present.  Constitutional:  Appearance: Normal appearance.  Cardiovascular:     Rate and Rhythm: Normal rate and regular rhythm.     Pulses: Normal pulses.     Heart sounds: Normal heart sounds.  Pulmonary:     Effort: Pulmonary effort is normal.     Breath sounds: Normal breath sounds.  Abdominal:     Palpations: Abdomen is soft. There is no hepatomegaly, splenomegaly or mass.     Tenderness: There is no abdominal tenderness.  Musculoskeletal:     Right lower leg: No edema.     Left lower leg: No edema.  Lymphadenopathy:     Cervical: No cervical adenopathy.     Right cervical: No superficial, deep or posterior cervical adenopathy.    Left cervical: No superficial, deep or posterior cervical adenopathy.     Upper Body:     Right upper body: No supraclavicular or axillary adenopathy.     Left upper body: No supraclavicular or axillary adenopathy.  Neurological:     General: No focal deficit present.     Mental Status: He is alert and oriented to person, place, and time.  Psychiatric:        Mood and Affect: Mood normal.        Behavior: Behavior normal.    LABS:      Latest Ref Rng & Units 10/04/2023   11:24 AM 08/09/2023   10:36 AM 07/06/2023   10:56 AM  CBC  WBC 4.0 - 10.5 K/uL 6.6  6.6  6.3   Hemoglobin 13.0 - 17.0 g/dL 16.1  09.6  04.5   Hematocrit 39.0 - 52.0 % 43.0  40.4  43.2   Platelets 150 - 400 K/uL 313  287  301       Latest Ref Rng & Units 10/04/2023   11:24 AM 09/06/2023   11:35 AM 08/09/2023   10:36 AM  CMP   Glucose 70 - 99 mg/dL 96  409  811   BUN 8 - 23 mg/dL 12  13  10    Creatinine 0.61 - 1.24 mg/dL 9.14  7.82  9.56   Sodium 135 - 145 mmol/L 136  134  135   Potassium 3.5 - 5.1 mmol/L 3.8  4.0  3.6   Chloride 98 - 111 mmol/L 100  100  104   CO2 22 - 32 mmol/L 26  26  23    Calcium 8.9 - 10.3 mg/dL 8.6  8.6  8.2   Total Protein 6.5 - 8.1 g/dL 8.0  7.5  7.3   Total Bilirubin 0.3 - 1.2 mg/dL 0.6  0.8  1.0   Alkaline Phos 38 - 126 U/L 118  90  79   AST 15 - 41 U/L 21  19  16    ALT 0 - 44 U/L 33  34  22      No results found for: "CEA1", "CEA" / No results found for: "CEA1", "CEA" No results found for: "PSA1" No results found for: "OZH086" No results found for: "CAN125"  No results found for: "TOTALPROTELP", "ALBUMINELP", "A1GS", "A2GS", "BETS", "BETA2SER", "GAMS", "MSPIKE", "SPEI" No results found for: "TIBC", "FERRITIN", "IRONPCTSAT" Lab Results  Component Value Date   LDH 197 (H) 04/12/2023     STUDIES:   No results found.

## 2023-10-04 NOTE — Patient Instructions (Addendum)
[facility] at [facility] Discharge Instructions   You were seen and examined today by Dr. Ellin Saba.  He reviewed the results of your lab work which are normal/stable.   Continue Gilotrif as prescribed.    We will see you back in 8 weeks. We will repeat a PET scan prior to this visit.   Return as scheduled.    Thank you for choosing [facility] at [facility] to provide your oncology and hematology care.  To afford each patient quality time with our provider, please arrive at least 15 minutes before your scheduled appointment time.   If you have a lab appointment with the Cancer Center please come in thru the Main Entrance and check in at the main information desk.  You need to re-schedule your appointment should you arrive 10 or more minutes late.  We strive to give you quality time with our providers, and arriving late affects you and other patients whose appointments are after yours.  Also, if you no show three or more times for appointments you may be dismissed from the clinic at the providers discretion.     Again, thank you for choosing [facility].  Our hope is that these requests will decrease the amount of time that you wait before being seen by our physicians.       _____________________________________________________________  Should you have questions after your visit to [facility], please contact our office at [phone] and follow the prompts.  Our office hours are 8:00 a.m. and 4:30 p.m. Monday - Friday.  Please note that voicemails left after 4:00 p.m. may not be returned until the following business day.  We are closed weekends and major holidays.  You do have access to a nurse 24-7, just call the main number to the clinic [phone] and do not press any options, hold on the line and a nurse will answer the phone.    For prescription refill requests, have your pharmacy contact our office and  allow 72 hours.    Due to Covid, you will need to wear a mask upon entering the hospital. If you do not have a mask, a mask will be given to you at the Main Entrance upon arrival. For doctor visits, patients may have 1 support person age 61 or older with them. For treatment visits, patients can not have anyone with them due to social distancing guidelines and our immunocompromised population.

## 2023-10-04 NOTE — Progress Notes (Signed)
Patient is taking Gilotrif as prescribed.  He has not missed any doses and reports no side effects at this time.

## 2023-10-04 NOTE — Progress Notes (Signed)
Patient tolerated therapy with no complaints voiced. Labs reviewed. Side effects with management reviewed with understanding verbalized. Peripheral IV site clean and dry with no bruising or swelling noted at site. Good blood return noted before and after administration of therapy. Band aid applied. Patient left in satisfactory condition with VSS and no s/s of distress noted.  

## 2023-10-04 NOTE — Patient Instructions (Signed)

## 2023-10-16 ENCOUNTER — Other Ambulatory Visit: Payer: Self-pay

## 2023-10-17 ENCOUNTER — Other Ambulatory Visit: Payer: Self-pay | Admitting: Hematology

## 2023-10-17 ENCOUNTER — Other Ambulatory Visit: Payer: Self-pay

## 2023-10-17 DIAGNOSIS — C342 Malignant neoplasm of middle lobe, bronchus or lung: Secondary | ICD-10-CM

## 2023-10-17 MED ORDER — AFATINIB DIMALEATE 40 MG PO TABS
40.0000 mg | ORAL_TABLET | Freq: Every day | ORAL | 3 refills | Status: DC
  Filled 2023-10-18: qty 30, 30d supply, fill #0
  Filled 2023-11-20: qty 30, 30d supply, fill #1
  Filled 2023-12-14: qty 30, 30d supply, fill #2
  Filled 2024-01-12: qty 30, 30d supply, fill #3

## 2023-10-17 NOTE — Progress Notes (Signed)
Specialty Pharmacy Ongoing Clinical Assessment Note  Theodore [name] is a 67 y.o. male who is being followed by the specialty pharmacy service for RxSp Oncology   Patient's specialty medication(s) reviewed today: Afatinib Dimaleate   Missed doses in the last 4 weeks: 0   Patient/Caregiver did not have any additional questions or concerns.   Therapeutic benefit summary: Patient is achieving benefit   Adverse events/side effects summary: No adverse events/side effects   Patient's therapy is appropriate to: Continue    Goals Addressed             This Visit's Progress    Slow Disease Progression       Patient is on track. Patient will maintain adherence and adhere to provider and/or lab appointments. Per provider notes PET scan from 08/17/23 show mostly stable disease and some improvement. PET scan to be repeated prior to December visit.         Follow up:  3 months  Otto Herb Specialty Pharmacist

## 2023-10-17 NOTE — Progress Notes (Signed)
Specialty Pharmacy Refill Coordination Note  Theodore [name] is a 67 y.o. male contacted today regarding refills of specialty medication(s) Afatinib Dimaleate   Patient requested Delivery   Delivery date: 10/27/23   Verified address: [address]   [place] [address]   Medication will be filled on 10/26/23.  Refill request pending.

## 2023-10-18 ENCOUNTER — Other Ambulatory Visit: Payer: Self-pay

## 2023-10-31 ENCOUNTER — Other Ambulatory Visit: Payer: Self-pay | Admitting: Hematology

## 2023-10-31 ENCOUNTER — Other Ambulatory Visit: Payer: Self-pay

## 2023-10-31 DIAGNOSIS — C3490 Malignant neoplasm of unspecified part of unspecified bronchus or lung: Secondary | ICD-10-CM

## 2023-10-31 DIAGNOSIS — C7951 Secondary malignant neoplasm of bone: Secondary | ICD-10-CM

## 2023-11-01 ENCOUNTER — Other Ambulatory Visit: Payer: Self-pay

## 2023-11-01 ENCOUNTER — Inpatient Hospital Stay: Payer: Medicare Other

## 2023-11-01 ENCOUNTER — Inpatient Hospital Stay: Payer: Medicare Other | Attending: Hematology

## 2023-11-01 ENCOUNTER — Encounter: Payer: Self-pay | Admitting: Hematology

## 2023-11-01 VITALS — BP 115/69 | HR 86 | Temp 96.7°F | Resp 20 | Wt 118.6 lb

## 2023-11-01 DIAGNOSIS — Z87891 Personal history of nicotine dependence: Secondary | ICD-10-CM | POA: Diagnosis not present

## 2023-11-01 DIAGNOSIS — E876 Hypokalemia: Secondary | ICD-10-CM | POA: Diagnosis not present

## 2023-11-01 DIAGNOSIS — C7951 Secondary malignant neoplasm of bone: Secondary | ICD-10-CM

## 2023-11-01 DIAGNOSIS — C342 Malignant neoplasm of middle lobe, bronchus or lung: Secondary | ICD-10-CM

## 2023-11-01 DIAGNOSIS — C7931 Secondary malignant neoplasm of brain: Secondary | ICD-10-CM | POA: Diagnosis not present

## 2023-11-01 DIAGNOSIS — Z139 Encounter for screening, unspecified: Secondary | ICD-10-CM

## 2023-11-01 DIAGNOSIS — Z79899 Other long term (current) drug therapy: Secondary | ICD-10-CM | POA: Insufficient documentation

## 2023-11-01 DIAGNOSIS — C7972 Secondary malignant neoplasm of left adrenal gland: Secondary | ICD-10-CM | POA: Diagnosis not present

## 2023-11-01 DIAGNOSIS — C3490 Malignant neoplasm of unspecified part of unspecified bronchus or lung: Secondary | ICD-10-CM

## 2023-11-01 DIAGNOSIS — R7309 Other abnormal glucose: Secondary | ICD-10-CM | POA: Insufficient documentation

## 2023-11-01 DIAGNOSIS — C7971 Secondary malignant neoplasm of right adrenal gland: Secondary | ICD-10-CM | POA: Insufficient documentation

## 2023-11-01 LAB — LIPID PANEL
Cholesterol: 130 mg/dL (ref 0–200)
HDL: 49 mg/dL (ref >40)
LDL Cholesterol: 72 mg/dL (ref 0–99)
Total CHOL/HDL Ratio: 2.7 {ratio}
Triglycerides: 45 mg/dL (ref <150)
VLDL: 9 mg/dL (ref 0–40)

## 2023-11-01 LAB — CBC WITH DIFFERENTIAL/PLATELET
Abs Immature Granulocytes: 0.01 10*3/uL (ref 0.00–0.07)
Basophils Absolute: 0 10*3/uL (ref 0.0–0.1)
Basophils Relative: 0 %
Eosinophils Absolute: 0.2 10*3/uL (ref 0.0–0.5)
Eosinophils Relative: 3 %
HCT: 39.9 % (ref 39.0–52.0)
Hemoglobin: 12.8 g/dL — ABNORMAL LOW (ref 13.0–17.0)
Immature Granulocytes: 0 %
Lymphocytes Relative: 22 %
Lymphs Abs: 1.3 10*3/uL (ref 0.7–4.0)
MCH: 28.1 pg (ref 26.0–34.0)
MCHC: 32.1 g/dL (ref 30.0–36.0)
MCV: 87.7 fL (ref 80.0–100.0)
Monocytes Absolute: 0.9 10*3/uL (ref 0.1–1.0)
Monocytes Relative: 14 %
Neutro Abs: 3.7 10*3/uL (ref 1.7–7.7)
Neutrophils Relative %: 61 %
Platelets: 287 10*3/uL (ref 150–400)
RBC: 4.55 MIL/uL (ref 4.22–5.81)
RDW: 15.2 % (ref 11.5–15.5)
WBC: 6.1 10*3/uL (ref 4.0–10.5)
nRBC: 0 % (ref 0.0–0.2)

## 2023-11-01 LAB — COMPREHENSIVE METABOLIC PANEL
ALT: 21 U/L (ref 0–44)
AST: 17 U/L (ref 15–41)
Albumin: 3.4 g/dL — ABNORMAL LOW (ref 3.5–5.0)
Alkaline Phosphatase: 93 U/L (ref 38–126)
Anion gap: 7 (ref 5–15)
BUN: 12 mg/dL (ref 8–23)
CO2: 27 mmol/L (ref 22–32)
Calcium: 8.3 mg/dL — ABNORMAL LOW (ref 8.9–10.3)
Chloride: 99 mmol/L (ref 98–111)
Creatinine, Ser: 0.75 mg/dL (ref 0.61–1.24)
GFR, Estimated: >60 mL/min (ref >60)
Glucose, Bld: 109 mg/dL — ABNORMAL HIGH (ref 70–99)
Potassium: 3.7 mmol/L (ref 3.5–5.1)
Sodium: 133 mmol/L — ABNORMAL LOW (ref 135–145)
Total Bilirubin: 0.6 mg/dL (ref <1.2)
Total Protein: 7.5 g/dL (ref 6.5–8.1)

## 2023-11-01 LAB — HEMOGLOBIN A1C
Hgb A1c MFr Bld: 5.9 % — ABNORMAL HIGH (ref 4.8–5.6)
Mean Plasma Glucose: 122.63 mg/dL

## 2023-11-01 MED ORDER — SODIUM CHLORIDE 0.9 % IV SOLN: Freq: Once | INTRAVENOUS | Status: AC

## 2023-11-01 MED ORDER — ZOLEDRONIC ACID 4 MG/100ML IV SOLN
4.0000 mg | Freq: Once | INTRAVENOUS | Status: AC
  Administered 2023-11-01: 4 mg via INTRAVENOUS
  Filled 2023-11-01: qty 100

## 2023-11-01 NOTE — Progress Notes (Signed)
Patient tolerated zometa with no complaints voiced.  Side effects with management reviewed with understanding verbalized.  IV site clean and dry with no bruising or swelling noted at site.  Good blood return noted before and after administration of zometa.  Band aid applied.  Patient left in satisfactory condition with VSS and no s/s of distress noted.   Nihar Klus [employer]

## 2023-11-01 NOTE — Patient Instructions (Signed)

## 2023-11-02 LAB — MICROALBUMIN / CREATININE URINE RATIO
Creatinine, Urine: 228.6 mg/dL
Microalb Creat Ratio: 4 mg/g{creat} (ref 0–29)
Microalb, Ur: 9.2 ug/mL — ABNORMAL HIGH

## 2023-11-07 ENCOUNTER — Other Ambulatory Visit: Payer: Self-pay

## 2023-11-07 MED ORDER — DIPHENOXYLATE-ATROPINE 2.5-0.025 MG PO TABS: 1.0000 | ORAL_TABLET | Freq: Four times a day (QID) | ORAL | 1 refills | Status: DC | PRN

## 2023-11-20 ENCOUNTER — Other Ambulatory Visit: Payer: Self-pay

## 2023-11-20 NOTE — Progress Notes (Signed)
Specialty Pharmacy Refill Coordination Note  Theodore [name] is a 67 y.o. male contacted today regarding refills of specialty medication(s) Afatinib Dimaleate   Patient requested Delivery   Delivery date: 11/28/23   Verified address: [address] [place] [address]   Medication will be filled on 11/27/23.

## 2023-11-23 ENCOUNTER — Ambulatory Visit (HOSPITAL_COMMUNITY)
Admission: RE | Admit: 2023-11-23 | Discharge: 2023-11-23 | Disposition: A | Discharge status: Home / Self Care | Payer: Medicare Other | Source: Ambulatory Visit | Attending: Hematology | Admitting: Hematology

## 2023-11-23 DIAGNOSIS — C3490 Malignant neoplasm of unspecified part of unspecified bronchus or lung: Secondary | ICD-10-CM | POA: Insufficient documentation

## 2023-11-23 MED ORDER — FLUDEOXYGLUCOSE F - 18 (FDG) INJECTION
6.0000 | Freq: Once | INTRAVENOUS | Status: AC | PRN
  Administered 2023-11-23: 6 via INTRAVENOUS

## 2023-11-27 ENCOUNTER — Other Ambulatory Visit: Payer: Self-pay

## 2023-11-28 ENCOUNTER — Other Ambulatory Visit: Payer: Self-pay

## 2023-11-28 DIAGNOSIS — C3490 Malignant neoplasm of unspecified part of unspecified bronchus or lung: Secondary | ICD-10-CM

## 2023-11-29 ENCOUNTER — Inpatient Hospital Stay: Payer: Medicare Other

## 2023-11-29 ENCOUNTER — Inpatient Hospital Stay: Payer: Medicare Other | Attending: Hematology | Admitting: Hematology

## 2023-11-29 VITALS — BP 136/88 | HR 88 | Temp 98.1°F | Resp 16 | Wt 112.4 lb

## 2023-11-29 DIAGNOSIS — C3491 Malignant neoplasm of unspecified part of right bronchus or lung: Secondary | ICD-10-CM

## 2023-11-29 DIAGNOSIS — C7971 Secondary malignant neoplasm of right adrenal gland: Secondary | ICD-10-CM | POA: Insufficient documentation

## 2023-11-29 DIAGNOSIS — C342 Malignant neoplasm of middle lobe, bronchus or lung: Secondary | ICD-10-CM | POA: Insufficient documentation

## 2023-11-29 DIAGNOSIS — C7951 Secondary malignant neoplasm of bone: Secondary | ICD-10-CM | POA: Insufficient documentation

## 2023-11-29 DIAGNOSIS — C7931 Secondary malignant neoplasm of brain: Secondary | ICD-10-CM | POA: Diagnosis not present

## 2023-11-29 DIAGNOSIS — Z87891 Personal history of nicotine dependence: Secondary | ICD-10-CM | POA: Diagnosis not present

## 2023-11-29 DIAGNOSIS — E876 Hypokalemia: Secondary | ICD-10-CM | POA: Diagnosis not present

## 2023-11-29 DIAGNOSIS — C7972 Secondary malignant neoplasm of left adrenal gland: Secondary | ICD-10-CM | POA: Insufficient documentation

## 2023-11-29 DIAGNOSIS — Z79899 Other long term (current) drug therapy: Secondary | ICD-10-CM | POA: Diagnosis not present

## 2023-11-29 DIAGNOSIS — C3490 Malignant neoplasm of unspecified part of unspecified bronchus or lung: Secondary | ICD-10-CM

## 2023-11-29 LAB — CBC WITH DIFFERENTIAL/PLATELET
Abs Immature Granulocytes: 0.02 10*3/uL (ref 0.00–0.07)
Basophils Absolute: 0 10*3/uL (ref 0.0–0.1)
Basophils Relative: 0 %
Eosinophils Absolute: 0.1 10*3/uL (ref 0.0–0.5)
Eosinophils Relative: 2 %
HCT: 40.8 % (ref 39.0–52.0)
Hemoglobin: 13.2 g/dL (ref 13.0–17.0)
Immature Granulocytes: 0 %
Lymphocytes Relative: 27 %
Lymphs Abs: 1.7 10*3/uL (ref 0.7–4.0)
MCH: 27.9 pg (ref 26.0–34.0)
MCHC: 32.4 g/dL (ref 30.0–36.0)
MCV: 86.3 fL (ref 80.0–100.0)
Monocytes Absolute: 0.8 10*3/uL (ref 0.1–1.0)
Monocytes Relative: 13 %
Neutro Abs: 3.5 10*3/uL (ref 1.7–7.7)
Neutrophils Relative %: 58 %
Platelets: 349 10*3/uL (ref 150–400)
RBC: 4.73 MIL/uL (ref 4.22–5.81)
RDW: 14.9 % (ref 11.5–15.5)
WBC: 6.1 10*3/uL (ref 4.0–10.5)
nRBC: 0 % (ref 0.0–0.2)

## 2023-11-29 LAB — COMPREHENSIVE METABOLIC PANEL
ALT: 21 U/L (ref 0–44)
AST: 18 U/L (ref 15–41)
Albumin: 3.5 g/dL (ref 3.5–5.0)
Alkaline Phosphatase: 97 U/L (ref 38–126)
Anion gap: 10 (ref 5–15)
BUN: 9 mg/dL (ref 8–23)
CO2: 26 mmol/L (ref 22–32)
Calcium: 8.7 mg/dL — ABNORMAL LOW (ref 8.9–10.3)
Chloride: 100 mmol/L (ref 98–111)
Creatinine, Ser: 0.73 mg/dL (ref 0.61–1.24)
GFR, Estimated: >60 mL/min (ref >60)
Glucose, Bld: 97 mg/dL (ref 70–99)
Potassium: 3.1 mmol/L — ABNORMAL LOW (ref 3.5–5.1)
Sodium: 136 mmol/L (ref 135–145)
Total Bilirubin: 0.6 mg/dL (ref <1.2)
Total Protein: 8 g/dL (ref 6.5–8.1)

## 2023-11-29 LAB — MAGNESIUM: Magnesium: 1.8 mg/dL (ref 1.7–2.4)

## 2023-11-29 MED ORDER — SODIUM CHLORIDE 0.9% FLUSH: 10.0000 mL | Freq: Once | INTRAVENOUS | Status: DC | PRN

## 2023-11-29 MED ORDER — ZOLEDRONIC ACID 4 MG/100ML IV SOLN
4.0000 mg | Freq: Once | INTRAVENOUS | Status: AC
  Administered 2023-11-29: 4 mg via INTRAVENOUS
  Filled 2023-11-29: qty 100

## 2023-11-29 MED ORDER — SODIUM CHLORIDE 0.9 % IV SOLN: Freq: Once | INTRAVENOUS | Status: AC

## 2023-11-29 MED ORDER — HEPARIN SOD (PORK) LOCK FLUSH 100 UNIT/ML IV SOLN
500.0000 [IU] | Freq: Once | INTRAVENOUS | Status: DC | PRN
Start: 2023-11-29 — End: 2023-11-29

## 2023-11-29 NOTE — Progress Notes (Signed)
Zometa infusion given per orders. Patient tolerated it well without problems. Vitals stable and discharged home from clinic ambulatory. Follow up as scheduled.

## 2023-11-29 NOTE — Progress Notes (Signed)
Patient is taking Gilotrif as prescribed.  He has not missed any doses and reports no side effects at this time.

## 2023-11-29 NOTE — Progress Notes (Signed)
[facility] [address] [place] [address]    Clinic Day:  11/29/2023  Referring physician: Benita Stabile, MD  Patient Care Team: Benita Stabile, MD as PCP - General (Internal Medicine)   ASSESSMENT & PLAN:   Assessment: 1.  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 Sudie Bailey with a chest x-ray on 03/15/2023, with right pleural effusion. - CT chest (03/17/2023): Right middle lobe mass with complete collapse of the right middle lobe, moderate right pleural effusion, nodularity of the right minor fissure, lytic lesions of the right scapula and left third and fourth ribs.  Multiple enhancing liver lesions concerning for metastatic disease. - PET scan (04/06/2023): Right middle lobe primary bronchogenic carcinoma with mediastinal nodal, bone metastasis and probable upper abdominal nodal metastasis.  Right sided pneumothorax.  Metastasis with right acetabulum and proximal right femur.  Bilateral adrenal hypermetabolism with mild nodularity suspicious for early metastasis.  Right renal mass is not hypermetabolic.  No hepatic hypermetabolism. - Brain MRI (03/30/2023): 1.5 x 1.7 cm lesion in the left frontal lobe.  1.5 cm lesion in the high left frontal lobe.  5 mm lesion in the right frontal lobe.  Few other subcentimeter lesions, total of 10 reported. - Right pleural fluid cytology (04/11/2023): Malignant cells consistent with adenocarcinoma. - RML lung FNA (04/18/2023): Lung adenocarcinoma. - Guardant360: EGFR G719C, EGFR S768I.  MSI-high not detected. - QIONGEXB284 tissue next (04/26/2023): PD-L1 22 C3 TPS<1%.  EGFR G719C, EGFR S768I, T p53, EGFR amplification, PIK3CA amplification, BRAF amplification, RB1 splice site SNV.  MSI-high not detected. - Afatinib 30 mg daily started on 05/18/2023, dose increased to 40 mg daily on 07/06/2023   2.  Social/family history: - He  lives by himself.  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  He had exposure to chemicals.  He quit smoking in 2012.  Smoked 1 pack/day starting age 28. - Maternal grandmother and maternal uncle had colon cancers.    Plan: 1.  Metastatic adenocarcinoma of the lung to the brain, bones and adrenals: - He is tolerating afatinib reasonably well.  He has intermittent diarrhea. - He tried taking 1 tablet of Lomotil which did not help.  Recommended to take 2 tablets at each episode.  He also thinks diarrhea depends on what he eats. - Reviewed labs today: Normal LFTs.  CBC grossly normal. - PET scan from 11/23/2023: Stable mildly hypermetabolic mediastinal lymph nodes and nodular right middle lobe consolidation.  Large right pleural effusion decreased from September. - Continue afatinib 40 mg daily.  RTC 8 weeks for follow-up.   2.  Brain metastasis: - MRI from 08/30/2023: Positive response to therapy with only 2 remaining metastatic disease significantly reduced in size. - Repeat MRI in January.   3.  Diarrhea: - Take Lomotil 2 tablets at the onset of diarrhea.   4.  Bone metastasis: - He reports soreness over the gumline.  I have examined oral cavity with did not see any bone exposure or ulceration.  Calcium today is 8.7.  Continue Zometa every 4 weeks.  He will call us if there is any worsening.   5.  Hypokalemia: - Continue K-Dur 20 mill equivalents daily.  He has not taken his potassium today.  Density is low at 3.1.    No orders  of the defined types were placed in this encounter.     I,Katie Daubenspeck,acting as a Neurosurgeon for [name], MD.,have documented all relevant documentation on the behalf of [name], MD,as directed by  [name], MD while in the presence of [name], MD.   I, [name] MD, have reviewed the above documentation  for accuracy and completeness, and I agree with the above.   [name], MD   12/18/20241:45 PM  CHIEF COMPLAINT:   Diagnosis: metastatic non-small cell adenocarcinoma    Cancer Staging  Lung cancer [facility]) Staging form: Lung, AJCC 8th Edition - Clinical stage from 04/24/2023: Stage IVB (cT1c, cN2, pM1c) - Signed by [name], MD on 05/10/2023    Prior Therapy: palliative XRT to right scapula and right pelvis, 05/04/23 - 05/18/23   Current Therapy:  afatinib    HISTORY OF PRESENT ILLNESS:   Oncology History  Lung cancer (HCC)  04/10/2023 Initial Diagnosis   Lung cancer (HCC)   04/24/2023 Cancer Staging   Staging form: Lung, AJCC 8th Edition - Clinical stage from 04/24/2023: Stage IVB (cT1c, cN2, pM1c) - Signed by [name], MD on 05/10/2023 Histopathologic type: Adenocarcinoma, NOS Stage prefix: Initial diagnosis      INTERVAL HISTORY:   Theodore [name] is a 67 y.o. male presenting to clinic today for follow up of metastatic non-small cell adenocarcinoma. He was last seen by me on 10/04/23.  Since his last visit, he underwent restaging PET scan on 11/23/23.   Today, he states that he is doing well overall. His appetite level is at 100%. His energy level is at 100%.  PAST MEDICAL HISTORY:   Past Medical History: Past Medical History:  Diagnosis Date   COPD (chronic obstructive pulmonary disease) (HCC)     Surgical History: Past Surgical History:  Procedure Laterality Date   BRONCHIAL BIOPSY  04/18/2023   Procedure: BRONCHIAL BIOPSIES;  Surgeon: Josephine Igo, DO;  Location: [facility] ENDOSCOPY;  Service: Pulmonary;;   BRONCHIAL NEEDLE ASPIRATION BIOPSY  04/18/2023   Procedure: BRONCHIAL NEEDLE ASPIRATION BIOPSIES;  Surgeon: Josephine Igo, DO;  Location: [facility] ENDOSCOPY;  Service: Pulmonary;;   CHEST TUBE INSERTION Right 04/11/2023   Procedure: CHEST TUBE INSERTION;  Surgeon: Omar Person, MD;  Location: [facility] ENDOSCOPY;  Service: Pulmonary;  Laterality:  Right;  fentanyl IV only   excision of cyst left ankle     MASS EXCISION Left 08/27/2018   Procedure: EXCISION LIPOMA LEFT BUTTOCK;  Surgeon: Franky Macho, MD;  Location: [facility] ORS;  Service: General;  Laterality: Left;    Social History: Social History   Socioeconomic History   Marital status: Married    Spouse name: Not on file   Number of children: Not on file   Years of education: Not on file   Highest education level: Not on file  Occupational History   Not on file  Tobacco Use   Smoking status: Former    Current packs/day: 0.00    Types: Cigarettes    Quit date: 2012    Years since quitting: 12.9   Smokeless tobacco: Never  Vaping Use   Vaping status: Never Used  Substance and Sexual Activity   Alcohol use: Not Currently    Comment: Quit 1988   Drug use: Not Currently   Sexual activity: Yes    Birth control/protection: None  Other Topics Concern   Not on file  Social History Narrative   Not on file   Social Drivers of Health   Financial Resource Strain:  Not on file  Food Insecurity: Food Insecurity Present (05/02/2023)   Hunger Vital Sign    Worried About Running Out of Food in the Last Year: Never true    Ran Out of Food in the Last Year: Sometimes true  Transportation Needs: No Transportation Needs (05/02/2023)   PRAPARE - Administrator, Civil Service (Medical): No    Lack of Transportation (Non-Medical): No  Physical Activity: Not on file  Stress: Not on file  Social Connections: Not on file  Intimate Partner Violence: Not At Risk (05/02/2023)   Humiliation, Afraid, Rape, and Kick questionnaire    Fear of Current or Ex-Partner: No    Emotionally Abused: No    Physically Abused: No    Sexually Abused: No    Family History: Family History  Problem Relation Age of Onset   Colon cancer Maternal Grandmother    Colon cancer Maternal Uncle     Current Medications:  Current Outpatient Medications:    acetaminophen (TYLENOL) 325 MG tablet,  Take 2 tablets (650 mg total) by mouth every 6 (six) hours as needed for mild pain, moderate pain, headache or fever (or Fever >/= 101)., Disp: , Rfl:    afatinib dimaleate (GILOTRIF) 40 MG tablet, Take 1 tablet (40 mg total) by mouth daily. Take on an empty stomach 1hr before or 2hrs after meals., Disp: 30 tablet, Rfl: 3   albuterol (PROVENTIL HFA;VENTOLIN HFA) 108 (90 Base) MCG/ACT inhaler, Inhale 2 puffs into the lungs every 4 (four) hours as needed for wheezing or shortness of breath., Disp: , Rfl:    BREZTRI AEROSPHERE 160-9-4.8 MCG/ACT AERO, SMARTSIG:2 Puff(s) By Mouth Twice Daily, Disp: , Rfl:    clindamycin (CLINDAGEL) 1 % gel, Apply topically 2 (two) times daily., Disp: 30 g, Rfl: 0   diphenoxylate-atropine (LOMOTIL) 2.5-0.025 MG tablet, Take 1 tablet by mouth 4 (four) times daily as needed for diarrhea or loose stools., Disp: 60 tablet, Rfl: 1   folic acid (FOLVITE) 1 MG tablet, TAKE 1 TABLET BY MOUTH EVERY DAY, Disp: 90 tablet, Rfl: 3   HYDROCORTISONE, TOPICAL, 2.5 % SOLN, Apply topically twice daily., Disp: 30 mL, Rfl: 0   ibuprofen (ADVIL) 200 MG tablet, Take 400 mg by mouth every 6 (six) hours as needed for moderate pain., Disp: , Rfl:    LORazepam (ATIVAN) 0.5 MG tablet, 1 tab po 30 minutes prior to radiation or MRI scans, Disp: 30 tablet, Rfl: 0   potassium chloride SA (KLOR-CON M) 20 MEQ tablet, Take 1 tablet (20 mEq total) by mouth daily., Disp: 90 tablet, Rfl: 3 No current facility-administered medications for this visit.  Facility-Administered Medications Ordered in Other Visits:    heparin lock flush 100 unit/mL, 500 Units, Intracatheter, Once PRN, [name], MD   sodium chloride flush (NS) 0.9 % injection 10 mL, 10 mL, Intracatheter, Once PRN, [name], MD   Allergies: No Active Allergies  REVIEW OF SYSTEMS:   Review of Systems  Constitutional:  Negative for chills, fatigue and fever.  HENT:   Negative for lump/mass, mouth sores, nosebleeds, sore  throat and trouble swallowing.   Eyes:  Negative for eye problems.  Respiratory:  Negative for cough and shortness of breath.   Cardiovascular:  Negative for chest pain, leg swelling and palpitations.  Gastrointestinal:  Positive for diarrhea. Negative for abdominal pain, constipation, nausea and vomiting.  Genitourinary:  Negative for bladder incontinence, difficulty urinating, dysuria, frequency, hematuria and nocturia.   Musculoskeletal:  Negative for arthralgias, back pain, flank  pain, myalgias and neck pain.  Skin:  Negative for itching and rash.  Neurological:  Positive for numbness. Negative for dizziness and headaches.  Hematological:  Does not bruise/bleed easily.  Psychiatric/Behavioral:  Negative for depression, sleep disturbance and suicidal ideas. The patient is not nervous/anxious.   All other systems reviewed and are negative.    VITALS:   Blood pressure 136/88, pulse 88, temperature 98.1 F (36.7 C), temperature source Oral, resp. rate 16, weight 112 lb 6.4 oz (51 kg), SpO2 97%.  Wt Readings from Last 3 Encounters:  11/29/23 112 lb 6.4 oz (51 kg)  11/01/23 118 lb 9.6 oz (53.8 kg)  10/04/23 117 lb 14.4 oz (53.5 kg)    Body mass index is 16.6 kg/m.  Performance status (ECOG): 1 - Symptomatic but completely ambulatory  PHYSICAL EXAM:   Physical Exam Vitals and nursing note reviewed. Exam conducted with a chaperone present.  Constitutional:      Appearance: Normal appearance.  Cardiovascular:     Rate and Rhythm: Normal rate and regular rhythm.     Pulses: Normal pulses.     Heart sounds: Normal heart sounds.  Pulmonary:     Effort: Pulmonary effort is normal.     Breath sounds: Normal breath sounds.  Abdominal:     Palpations: Abdomen is soft. There is no hepatomegaly, splenomegaly or mass.     Tenderness: There is no abdominal tenderness.  Musculoskeletal:     Right lower leg: No edema.     Left lower leg: No edema.  Lymphadenopathy:     Cervical: No  cervical adenopathy.     Right cervical: No superficial, deep or posterior cervical adenopathy.    Left cervical: No superficial, deep or posterior cervical adenopathy.     Upper Body:     Right upper body: No supraclavicular or axillary adenopathy.     Left upper body: No supraclavicular or axillary adenopathy.  Neurological:     General: No focal deficit present.     Mental Status: He is alert and oriented to person, place, and time.  Psychiatric:        Mood and Affect: Mood normal.        Behavior: Behavior normal.     LABS:      Latest Ref Rng & Units 11/29/2023   11:25 AM 11/01/2023    7:58 AM 10/04/2023   11:24 AM  CBC  WBC 4.0 - 10.5 K/uL 6.1  6.1  6.6   Hemoglobin 13.0 - 17.0 g/dL 16.1  09.6  04.5   Hematocrit 39.0 - 52.0 % 40.8  39.9  43.0   Platelets 150 - 400 K/uL 349  287  313       Latest Ref Rng & Units 11/29/2023   11:25 AM 11/01/2023    7:47 AM 10/04/2023   11:24 AM  CMP  Glucose 70 - 99 mg/dL 97  409  96   BUN 8 - 23 mg/dL 9  12  12    Creatinine 0.61 - 1.24 mg/dL 8.11  9.14  7.82   Sodium 135 - 145 mmol/L 136  133  136   Potassium 3.5 - 5.1 mmol/L 3.1  3.7  3.8   Chloride 98 - 111 mmol/L 100  99  100   CO2 22 - 32 mmol/L 26  27  26    Calcium 8.9 - 10.3 mg/dL 8.7  8.3  8.6   Total Protein 6.5 - 8.1 g/dL 8.0  7.5  8.0   Total Bilirubin <  1.2 mg/dL 0.6  0.6  0.6   Alkaline Phos 38 - 126 U/L 97  93  118   AST 15 - 41 U/L 18  17  21    ALT 0 - 44 U/L 21  21  33      No results found for: "CEA1", "CEA" / No results found for: "CEA1", "CEA" No results found for: "PSA1" No results found for: "ZOX096" No results found for: "CAN125"  No results found for: "TOTALPROTELP", "ALBUMINELP", "A1GS", "A2GS", "BETS", "BETA2SER", "GAMS", "MSPIKE", "SPEI" No results found for: "TIBC", "FERRITIN", "IRONPCTSAT" Lab Results  Component Value Date   LDH 197 (H) 04/12/2023     STUDIES:   NM PET Image Restage (PS) Skull Base to Thigh (F-18 FDG) Result Date:  11/29/2023 CLINICAL DATA:  Subsequent treatment strategy for lung cancer. EXAM: NUCLEAR MEDICINE PET SKULL BASE TO THIGH TECHNIQUE: 6.0 mCi F-18 FDG was injected intravenously. Full-ring PET imaging was performed from the skull base to thigh after the radiotracer. CT data was obtained and used for attenuation correction and anatomic localization. Fasting blood glucose: 109 mg/dl COMPARISON:  04/54/0981. FINDINGS: Mediastinal blood pool activity: SUV max 1.5 Liver activity: SUV max NA NECK: No abnormal hypermetabolism. Incidental CT findings: None. CHEST: Similar mildly hypermetabolic lower paratracheal and subcarinal lymph nodes. Index subcarinal lymph node measures 9 mm, SUV max 3.7. Nodular consolidation in the right middle lobe measures approximately 2.1 x 2.3 cm (203/46), SUV max 3.8, as before. No new hypermetabolism. Incidental CT findings: Atherosclerotic calcification of the aorta and coronary arteries. Heart is at the upper limits of normal in size to mildly enlarged. No pericardial effusion. Large right pleural effusion, decreased from 08/17/2023, with smooth pleural thickening, indicative of chronicity. Collapse/consolidation in the right upper and right lower lobes. ABDOMEN/PELVIS: No abnormal hypermetabolism. Incidental CT findings: Peripherally calcified mildly hyperdense lesion in the right kidney, stable. SKELETON: Sclerotic lesion in the medial left clavicle, SUV max 3.1, stable. Multiple additional sclerotic metastases, as before, without abnormal hypermetabolism. Incidental CT findings: Degenerative changes in the spine. IMPRESSION: 1. Stable mildly hypermetabolic mediastinal lymph nodes and nodular right middle lobe consolidation. Treated/healed osseous metastatic disease. No evidence of disease progression. 2. Large right pleural effusion, decreased from 08/17/2023, with smooth pleural thickening, indicative of chronicity. Associated collapse/consolidation in the right upper and right lower  lobes. 3. Aortic atherosclerosis (ICD10-I70.0). Coronary artery calcification. Electronically Signed   By: Leanna Battles M.D.   On: 11/29/2023 09:42

## 2023-11-29 NOTE — Patient Instructions (Addendum)
[facility] at [facility] Discharge Instructions   You were seen and examined today by Dr. Ellin Saba.  He reviewed the results of your lab work which are normal/stable.   He reviewed the results of your PET scan which was stable.   Continue Gilotrif as prescribed.   Return as scheduled.    Thank you for choosing [facility] at [facility] to provide your oncology and hematology care.  To afford each patient quality time with our provider, please arrive at least 15 minutes before your scheduled appointment time.   If you have a lab appointment with the Cancer Center please come in thru the Main Entrance and check in at the main information desk.  You need to re-schedule your appointment should you arrive 10 or more minutes late.  We strive to give you quality time with our providers, and arriving late affects you and other patients whose appointments are after yours.  Also, if you no show three or more times for appointments you may be dismissed from the clinic at the providers discretion.     Again, thank you for choosing [facility].  Our hope is that these requests will decrease the amount of time that you wait before being seen by our physicians.       _____________________________________________________________  Should you have questions after your visit to [facility], please contact our office at [phone] and follow the prompts.  Our office hours are 8:00 a.m. and 4:30 p.m. Monday - Friday.  Please note that voicemails left after 4:00 p.m. may not be returned until the following business day.  We are closed weekends and major holidays.  You do have access to a nurse 24-7, just call the main number to the clinic [phone] and do not press any options, hold on the line and a nurse will answer the phone.    For prescription refill requests, have your pharmacy contact our office and allow 72 hours.    Due  to Covid, you will need to wear a mask upon entering the hospital. If you do not have a mask, a mask will be given to you at the Main Entrance upon arrival. For doctor visits, patients may have 1 support person age 74 or older with them. For treatment visits, patients can not have anyone with them due to social distancing guidelines and our immunocompromised population.

## 2023-12-14 ENCOUNTER — Other Ambulatory Visit: Payer: Self-pay

## 2023-12-14 NOTE — Progress Notes (Signed)
 Specialty Pharmacy Refill Coordination Note  Theodore [name] is a 68 y.o. male contacted today regarding refills of specialty medication(s) Afatinib  Dimaleate (GILOTRIF )   Patient requested Delivery   Delivery date: 12/26/23   Verified address: [address]   Medication will be filled on 12/25/23.

## 2023-12-18 ENCOUNTER — Other Ambulatory Visit: Payer: Medicare Other

## 2023-12-21 ENCOUNTER — Telehealth: Payer: Self-pay | Admitting: Pharmacist

## 2023-12-21 ENCOUNTER — Encounter: Payer: Self-pay | Admitting: Pharmacist

## 2023-12-21 NOTE — Telephone Encounter (Signed)
 Oral Chemotherapy Pharmacist Encounter  Successfully enrolled patient for copayment assistance funds from [facility] from the NSCLC fund.  Award amount: $[id number] Effective dates: 09/21/23 - 12/18/24 ID: [id number] BIN: [id number] Group: [id number] PCN: [id number]  Billing information will be shared with [facility].   Orvie Caradine N. Damya Comley, PharmD, BCPS Hematology/Oncology Clinical Pharmacist [facility] [phone]  12/21/2023 3:58 PM

## 2023-12-21 NOTE — Progress Notes (Signed)
 Encounter opened in error.

## 2023-12-25 ENCOUNTER — Ambulatory Visit: Payer: Medicare Other | Admitting: Radiation Oncology

## 2023-12-25 ENCOUNTER — Encounter: Payer: Self-pay | Admitting: Hematology

## 2023-12-25 ENCOUNTER — Other Ambulatory Visit (HOSPITAL_COMMUNITY): Payer: Self-pay

## 2023-12-25 ENCOUNTER — Other Ambulatory Visit: Payer: Self-pay

## 2023-12-26 ENCOUNTER — Other Ambulatory Visit: Payer: Self-pay

## 2023-12-26 DIAGNOSIS — C342 Malignant neoplasm of middle lobe, bronchus or lung: Secondary | ICD-10-CM

## 2023-12-27 ENCOUNTER — Inpatient Hospital Stay: Payer: Medicare Other

## 2023-12-27 ENCOUNTER — Inpatient Hospital Stay: Payer: Medicare Other | Attending: Hematology

## 2023-12-27 VITALS — BP 102/70 | HR 89 | Temp 97.9°F | Resp 18

## 2023-12-27 DIAGNOSIS — Z79899 Other long term (current) drug therapy: Secondary | ICD-10-CM | POA: Insufficient documentation

## 2023-12-27 DIAGNOSIS — C7951 Secondary malignant neoplasm of bone: Secondary | ICD-10-CM

## 2023-12-27 DIAGNOSIS — C342 Malignant neoplasm of middle lobe, bronchus or lung: Secondary | ICD-10-CM | POA: Diagnosis present

## 2023-12-27 LAB — CBC WITH DIFFERENTIAL/PLATELET
Abs Immature Granulocytes: 0.01 10*3/uL (ref 0.00–0.07)
Basophils Absolute: 0 10*3/uL (ref 0.0–0.1)
Basophils Relative: 0 %
Eosinophils Absolute: 0.1 10*3/uL (ref 0.0–0.5)
Eosinophils Relative: 2 %
HCT: 37.4 % — ABNORMAL LOW (ref 39.0–52.0)
Hemoglobin: 12 g/dL — ABNORMAL LOW (ref 13.0–17.0)
Immature Granulocytes: 0 %
Lymphocytes Relative: 19 %
Lymphs Abs: 1.3 10*3/uL (ref 0.7–4.0)
MCH: 27.5 pg (ref 26.0–34.0)
MCHC: 32.1 g/dL (ref 30.0–36.0)
MCV: 85.8 fL (ref 80.0–100.0)
Monocytes Absolute: 0.8 10*3/uL (ref 0.1–1.0)
Monocytes Relative: 12 %
Neutro Abs: 4.6 10*3/uL (ref 1.7–7.7)
Neutrophils Relative %: 67 %
Platelets: 333 10*3/uL (ref 150–400)
RBC: 4.36 MIL/uL (ref 4.22–5.81)
RDW: 16.5 % — ABNORMAL HIGH (ref 11.5–15.5)
WBC: 6.8 10*3/uL (ref 4.0–10.5)
nRBC: 0 % (ref 0.0–0.2)

## 2023-12-27 LAB — MAGNESIUM: Magnesium: 1.9 mg/dL (ref 1.7–2.4)

## 2023-12-27 LAB — COMPREHENSIVE METABOLIC PANEL
ALT: 23 U/L (ref 0–44)
AST: 19 U/L (ref 15–41)
Albumin: 3.3 g/dL — ABNORMAL LOW (ref 3.5–5.0)
Alkaline Phosphatase: 97 U/L (ref 38–126)
Anion gap: 8 (ref 5–15)
BUN: 11 mg/dL (ref 8–23)
CO2: 27 mmol/L (ref 22–32)
Calcium: 8.8 mg/dL — ABNORMAL LOW (ref 8.9–10.3)
Chloride: 99 mmol/L (ref 98–111)
Creatinine, Ser: 0.74 mg/dL (ref 0.61–1.24)
GFR, Estimated: >60 mL/min (ref >60)
Glucose, Bld: 96 mg/dL (ref 70–99)
Potassium: 4 mmol/L (ref 3.5–5.1)
Sodium: 134 mmol/L — ABNORMAL LOW (ref 135–145)
Total Bilirubin: 0.7 mg/dL (ref 0.0–1.2)
Total Protein: 7.5 g/dL (ref 6.5–8.1)

## 2023-12-27 MED ORDER — ZOLEDRONIC ACID 4 MG/100ML IV SOLN
4.0000 mg | Freq: Once | INTRAVENOUS | Status: AC
  Administered 2023-12-27: 4 mg via INTRAVENOUS
  Filled 2023-12-27: qty 100

## 2023-12-27 MED ORDER — SODIUM CHLORIDE 0.9 % IV SOLN: Freq: Once | INTRAVENOUS | Status: AC

## 2023-12-27 NOTE — Patient Instructions (Signed)

## 2023-12-27 NOTE — Progress Notes (Signed)
Patient tolerated therapy with no complaints voiced. Labs reviewed. Side effects with management reviewed with understanding verbalized. Peripheral IV site clean and dry with no bruising or swelling noted at site. Good blood return noted before and after administration of therapy. Band aid applied. Patient left in satisfactory condition with VSS and no s/s of distress noted.  

## 2024-01-03 ENCOUNTER — Other Ambulatory Visit: Payer: Self-pay

## 2024-01-04 ENCOUNTER — Encounter (INDEPENDENT_AMBULATORY_CARE_PROVIDER_SITE_OTHER): Payer: Self-pay | Admitting: *Deleted

## 2024-01-09 ENCOUNTER — Ambulatory Visit
Admission: RE | Admit: 2024-01-09 | Discharge: 2024-01-09 | Disposition: A | Discharge status: Home / Self Care | Payer: Medicare Other | Source: Ambulatory Visit | Attending: Radiation Oncology | Admitting: Radiation Oncology

## 2024-01-09 DIAGNOSIS — C7931 Secondary malignant neoplasm of brain: Secondary | ICD-10-CM

## 2024-01-09 MED ORDER — GADOPICLENOL 0.5 MMOL/ML IV SOLN
5.0000 mL | Freq: Once | INTRAVENOUS | Status: AC | PRN
  Administered 2024-01-09: 5 mL via INTRAVENOUS

## 2024-01-10 ENCOUNTER — Other Ambulatory Visit: Payer: Self-pay

## 2024-01-12 ENCOUNTER — Other Ambulatory Visit: Payer: Self-pay

## 2024-01-12 NOTE — Progress Notes (Signed)
Specialty Pharmacy Refill Coordination Note  Theodore [name] is a 68 y.o. male contacted today regarding refills of specialty medication(s) Afatinib Dimaleate (GILOTRIF)   Patient requested Delivery   Delivery date: 01/19/24   Verified address: [address]   Medication will be filled on 01/18/24.

## 2024-01-12 NOTE — Progress Notes (Signed)
Specialty Pharmacy Ongoing Clinical Assessment Note  Theodore [name] is a 68 y.o. male who is being followed by the specialty pharmacy service for RxSp Oncology   Patient's specialty medication(s) reviewed today: Afatinib Dimaleate (GILOTRIF)   Missed doses in the last 4 weeks: 0   Patient/Caregiver did not have any additional questions or concerns.   Therapeutic benefit summary: Patient is achieving benefit (11/29/23 OV, PET scan is stable.)   Adverse events/side effects summary: No adverse events/side effects   Patient's therapy is appropriate to: Continue    Goals Addressed             This Visit's Progress    Stabilization of disease       Patient is on track. Patient will maintain adherence         Follow up:  3 months  Bobette Mo Specialty Pharmacist

## 2024-01-15 ENCOUNTER — Encounter: Payer: Self-pay | Admitting: Radiation Oncology

## 2024-01-15 ENCOUNTER — Ambulatory Visit
Admission: RE | Admit: 2024-01-15 | Discharge: 2024-01-15 | Disposition: A | Discharge status: Home / Self Care | Payer: Medicare Other | Source: Ambulatory Visit | Attending: Radiation Oncology | Admitting: Radiation Oncology

## 2024-01-15 DIAGNOSIS — C3491 Malignant neoplasm of unspecified part of right bronchus or lung: Secondary | ICD-10-CM

## 2024-01-15 NOTE — Progress Notes (Signed)
Telephone nursing appointment for review of most recent MRI-Brain. I verified patient's identity x2 and began nursing interview.   Patient reports doing well. Patient denies any related issues at this time.   Meaningful use complete.   Patient aware of their 2:30pm-01/15/24 telephone appointment w/ Laurence Aly PA-C. I left my extension [phone] in case patient needs anything. Patient verbalized understanding. This concludes the nursing interview.   Patient contact [phone]     Ruel Favors, LPN

## 2024-01-15 NOTE — Progress Notes (Incomplete)
Radiation Oncology         [phone] ________________________________  Outpatient follow up - Conducted via telephone at patient request.  I spoke with the patient to conduct this  visit via telephone. The patient was notified in advance and was offered an in person or telemedicine meeting to allow for face to face communication but instead preferred to proceed with a telephone follow up.   Name: Theodore [name]        MRN: [id number]  Date of Service: 01/15/2024 DOB: [birth year]  WU:[id number], Kathleene Hazel, MD  Benita Stabile, MD     REFERRING PHYSICIAN: Benita Stabile, MD   DIAGNOSIS: The encounter diagnosis was Malignant neoplasm of right lung, unspecified part of lung (HCC).   HISTORY OF PRESENT ILLNESS: Theodore [name] is a 68 y.o. 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 He was originally diagnosed after a CT scan  on 03/17/23 showed a 28 cm mass in the RML with associated complete collapse of the RML and nodularity of the right minor fissure. There were multiple enhancing liver lesions and a lytic change of the right scapula left 3 rd and 4th ribs. an MRI brain on 03/30/23 showed at least 11 called, but multiple brain metastases. A  PET scan on 04/06/2023 showed hypermetabolic activity within the right middle lobe, mediastinum including adenopathy extending into the upper abdominal chains, with osseous disease as well.  Hypermetabolic activity was seen in the right acetabulum, proximal right femur, bilateral adrenal glands, and right scapula as well as destruction involving the third and fourth anterolateral left ribs.  He underwent bronchoscopy on 04/18/2023, fine-needle aspirate of the right middle lobe showed a non-small cell carcinoma consistent with adenocarcinoma that was EGFR mutated.  He went on to receive palliative radiation to the right lung, and  right pelvis including the hip. He desired whole brain radiation in [place] and met with Dr. Langston Masker. He was encouraged to continue his Afatinib to see if this would address his brain disease prior to proceeding with whole brain radiation. He has tolerated this medication, and MRI scans have been recommended for follow up. He has done well and the areas have improved over time. His most recent MRI brain on 01/09/24 showed continued improvement in the two remaining lesions, the largest being in the left frontal region measuring 10 mm. He's contacted to review these results.    PREVIOUS RADIATION THERAPY:      05/04/23-05/18/23 Plan Name: Chest_R Site: Scapula, Right Technique: Isodose Plan Mode: Photon Dose Per Fraction: 3 Gy Prescribed Dose (Delivered / Prescribed): 30 Gy / 30 Gy Prescribed Fxs (Delivered / Prescribed): 10 / 10   Plan Name: Pelvis_R Site: Hip, Right Technique: Isodose Plan Mode: Photon Dose Per Fraction: 3 Gy Prescribed Dose (Delivered / Prescribed): 30 Gy / 30 Gy Prescribed Fxs (Delivered / Prescribed): 10 / 10   PAST MEDICAL HISTORY:  Past Medical History:  Diagnosis Date   COPD (chronic obstructive pulmonary disease) (HCC)        PAST SURGICAL HISTORY: Past Surgical History:  Procedure Laterality Date   BRONCHIAL BIOPSY  04/18/2023   Procedure: BRONCHIAL BIOPSIES;  Surgeon: Josephine Igo, DO;  Location: [facility];  Service: Pulmonary;;   BRONCHIAL NEEDLE ASPIRATION BIOPSY  04/18/2023   Procedure: BRONCHIAL NEEDLE ASPIRATION BIOPSIES;  Surgeon: Josephine Igo, DO;  Location: [facility];  Service: Pulmonary;;   CHEST TUBE INSERTION Right 04/11/2023   Procedure: CHEST TUBE INSERTION;  Surgeon: Omar Person, MD;  Location[facility];  Service: Pulmonary;  Laterality: Right;  fentanyl IV only   excision of cyst left ankle     MASS EXCISION Left 08/27/2018   Procedure: EXCISION LIPOMA LEFT BUTTOCK;  Surgeon: Franky Macho, MD;  Location: [facility];  Service:  General;  Laterality: Left;     FAMILY HISTORY:  Family History  Problem Relation Age of Onset   Colon cancer Maternal Grandmother    Colon cancer Maternal Uncle      SOCIAL HISTORY:  reports that he quit smoking about 13 years ago. His smoking use included cigarettes. He has never used smokeless tobacco. He reports that he does not currently use alcohol. He reports that he does not currently use drugs. The patient is single and lives in [place].    ALLERGIES: Patient has no active allergies.   MEDICATIONS:  Current Outpatient Medications  Medication Sig Dispense Refill   acetaminophen (TYLENOL) 325 MG tablet Take 2 tablets (650 mg total) by mouth every 6 (six) hours as needed for mild pain, moderate pain, headache or fever (or Fever >/= 101).     afatinib dimaleate (GILOTRIF) 40 MG tablet Take 1 tablet (40 mg total) by mouth daily. Take on an empty stomach 1hr before or 2hrs after meals. 30 tablet 3   albuterol (PROVENTIL HFA;VENTOLIN HFA) 108 (90 Base) MCG/ACT inhaler Inhale 2 puffs into the lungs every 4 (four) hours as needed for wheezing or shortness of breath.     BREZTRI AEROSPHERE 160-9-4.8 MCG/ACT AERO SMARTSIG:2 Puff(s) By Mouth Twice Daily     clindamycin (CLINDAGEL) 1 % gel Apply topically 2 (two) times daily. 30 g 0   diphenoxylate-atropine (LOMOTIL) 2.5-0.025 MG tablet Take 1 tablet by mouth 4 (four) times daily as needed for diarrhea or loose stools. 60 tablet 1   folic acid (FOLVITE) 1 MG tablet TAKE 1 TABLET BY MOUTH EVERY DAY 90 tablet 3   HYDROCORTISONE, TOPICAL, 2.5 % SOLN Apply topically twice daily. 30 mL 0   ibuprofen (ADVIL) 200 MG tablet Take 400 mg by mouth every 6 (six) hours as needed for moderate pain.     LORazepam (ATIVAN) 0.5 MG tablet 1 tab po 30 minutes prior to radiation or MRI scans 30 tablet 0   potassium chloride SA (KLOR-CON M) 20 MEQ tablet Take 1 tablet (20 mEq total) by mouth daily. 90 tablet 3   No current facility-administered  medications for this encounter.     REVIEW OF SYSTEMS: On review of systems, the patient reports that he is doing***  PHYSICAL EXAM:  Unable to assess due to encounter type.    ECOG = 1  0 - Asymptomatic (Fully active, able to carry on all predisease activities without restriction)  1 - Symptomatic but completely ambulatory (Restricted in physically strenuous activity but ambulatory and able to carry out work of a light or sedentary nature. For example, light housework, office work)  2 - Symptomatic, <50% in bed during the day (Ambulatory and capable of all self care but unable to carry out any work activities. Up and about more than 50% of waking hours)  3 - Symptomatic, >50% in bed, but not bedbound (Capable of only limited self-care, confined to bed or chair 50% or more of waking hours)  4 - Bedbound (Completely disabled. Cannot carry on any self-care. Totally confined to bed or  chair)  5 - Death   Santiago Glad MM, Creech RH, Tormey DC, et al. [phone]). "Toxicity and response criteria of the [facility]". Am. Evlyn Clines. Oncol. 5 (6): 649-55    LABORATORY DATA:  Lab Results  Component Value Date   WBC 6.8 12/27/2023   HGB 12.0 (L) 12/27/2023   HCT 37.4 (L) 12/27/2023   MCV 85.8 12/27/2023   PLT 333 12/27/2023   Lab Results  Component Value Date   NA 134 (L) 12/27/2023   K 4.0 12/27/2023   CL 99 12/27/2023   CO2 27 12/27/2023   Lab Results  Component Value Date   ALT 23 12/27/2023   AST 19 12/27/2023   ALKPHOS 97 12/27/2023   BILITOT 0.7 12/27/2023      RADIOGRAPHY: MR Brain W Wo Contrast Result Date: 01/09/2024 CLINICAL DATA:  Brain metastases, assess treatment response. History of metastatic lung cancer with brain and bone metastases. Status post whole brain radiation. EXAM: MRI HEAD WITHOUT AND WITH CONTRAST TECHNIQUE: Multiplanar, multiecho pulse sequences of the brain and surrounding structures were obtained without and with intravenous  contrast. CONTRAST:  5 mL Vueway COMPARISON:  Head MRI 08/30/2023 FINDINGS: Brain: The amount of residual enhancement at the sites of 2 previously treated left frontal metastases has mildly decreased further, with the larger residual lesion measuring 10 mm (series 13, image 98). There is persistent mild T2 hyperintensity/gliosis and chronic blood products at both of these sites. No new enhancing brain lesion, acute infarct, midline shift, or extra-axial fluid collection is identified. The ventricles are normal in size. Small T2 hyperintensities in the cerebral white matter bilaterally are nonspecific but compatible with mild chronic small vessel ischemic disease. Vascular: Major intracranial vascular flow voids are preserved. Skull and upper cervical spine: No suspicious marrow lesion. Spondylosis at C3-4 and C4-5. Sinuses/Orbits: Unremarkable orbits. Persistent moderate mucosal thickening in the ethmoid sinuses and mild thickening in the other sinuses. New moderate fluid level in the right maxillary sinus. Trace left mastoid fluid. Other: None. IMPRESSION: 1. Further decrease of residual enhancement at the sites of 2 previously treated left frontal metastases. 2. No evidence of new intracranial metastases. Electronically Signed   By: Sebastian Ache M.D.   On: 01/09/2024 15:36       IMPRESSION/PLAN: 1. 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 The patient appears to be doing very well as he continued Afatinib under the care of Dr. Ellin Saba.  *** 2.    Claustrophobia. The patient continues to use Ativan prior to 3T MRIs.     This encounter was conducted via telephone.  The patient has provided two factor identification and has given verbal consent for this type of encounter and has been advised to only accept a meeting of this type in a secure network environment. The time spent during this encounter was 35 minutes including preparation, discussion, and  coordination of the patient's care. The attendants for this meeting include  Ronny Bacon  and Marca Ancona.  During the encounter,   Ronny Bacon was located remotely*** Theodore [name] was located at home.     Osker Mason, [facility]   **Disclaimer: This note was dictated with voice recognition software. Similar sounding words can inadvertently be transcribed and this note may contain transcription errors which may not have been corrected upon publication of note.**

## 2024-01-17 ENCOUNTER — Other Ambulatory Visit: Payer: Self-pay | Admitting: Radiation Oncology

## 2024-01-17 ENCOUNTER — Encounter: Payer: Self-pay | Admitting: Radiation Oncology

## 2024-01-17 DIAGNOSIS — C3491 Malignant neoplasm of unspecified part of right bronchus or lung: Secondary | ICD-10-CM

## 2024-01-17 DIAGNOSIS — C7931 Secondary malignant neoplasm of brain: Secondary | ICD-10-CM

## 2024-01-23 ENCOUNTER — Other Ambulatory Visit: Payer: Self-pay

## 2024-01-23 DIAGNOSIS — C7951 Secondary malignant neoplasm of bone: Secondary | ICD-10-CM

## 2024-01-23 DIAGNOSIS — C342 Malignant neoplasm of middle lobe, bronchus or lung: Secondary | ICD-10-CM

## 2024-01-24 ENCOUNTER — Other Ambulatory Visit: Payer: Self-pay | Admitting: *Deleted

## 2024-01-24 ENCOUNTER — Inpatient Hospital Stay: Payer: Medicare Other

## 2024-01-24 ENCOUNTER — Inpatient Hospital Stay: Payer: Medicare Other | Attending: Hematology | Admitting: Hematology

## 2024-01-24 VITALS — BP 119/75 | HR 106 | Temp 97.9°F | Resp 19 | Ht 69.0 in | Wt 116.0 lb

## 2024-01-24 VITALS — BP 121/71 | HR 81 | Resp 19

## 2024-01-24 DIAGNOSIS — Z87891 Personal history of nicotine dependence: Secondary | ICD-10-CM | POA: Insufficient documentation

## 2024-01-24 DIAGNOSIS — C7971 Secondary malignant neoplasm of right adrenal gland: Secondary | ICD-10-CM | POA: Insufficient documentation

## 2024-01-24 DIAGNOSIS — C7931 Secondary malignant neoplasm of brain: Secondary | ICD-10-CM | POA: Diagnosis not present

## 2024-01-24 DIAGNOSIS — C7951 Secondary malignant neoplasm of bone: Secondary | ICD-10-CM | POA: Insufficient documentation

## 2024-01-24 DIAGNOSIS — C342 Malignant neoplasm of middle lobe, bronchus or lung: Secondary | ICD-10-CM | POA: Insufficient documentation

## 2024-01-24 DIAGNOSIS — C7972 Secondary malignant neoplasm of left adrenal gland: Secondary | ICD-10-CM | POA: Insufficient documentation

## 2024-01-24 DIAGNOSIS — C3490 Malignant neoplasm of unspecified part of unspecified bronchus or lung: Secondary | ICD-10-CM

## 2024-01-24 DIAGNOSIS — Z79899 Other long term (current) drug therapy: Secondary | ICD-10-CM | POA: Insufficient documentation

## 2024-01-24 LAB — CBC WITH DIFFERENTIAL/PLATELET
Abs Immature Granulocytes: 0.01 10*3/uL (ref 0.00–0.07)
Basophils Absolute: 0 10*3/uL (ref 0.0–0.1)
Basophils Relative: 0 %
Eosinophils Absolute: 0.1 10*3/uL (ref 0.0–0.5)
Eosinophils Relative: 2 %
HCT: 39 % (ref 39.0–52.0)
Hemoglobin: 12.5 g/dL — ABNORMAL LOW (ref 13.0–17.0)
Immature Granulocytes: 0 %
Lymphocytes Relative: 20 %
Lymphs Abs: 1.3 10*3/uL (ref 0.7–4.0)
MCH: 27.9 pg (ref 26.0–34.0)
MCHC: 32.1 g/dL (ref 30.0–36.0)
MCV: 87.1 fL (ref 80.0–100.0)
Monocytes Absolute: 0.7 10*3/uL (ref 0.1–1.0)
Monocytes Relative: 10 %
Neutro Abs: 4.6 10*3/uL (ref 1.7–7.7)
Neutrophils Relative %: 68 %
Platelets: 352 10*3/uL (ref 150–400)
RBC: 4.48 MIL/uL (ref 4.22–5.81)
RDW: 16.9 % — ABNORMAL HIGH (ref 11.5–15.5)
WBC: 6.8 10*3/uL (ref 4.0–10.5)
nRBC: 0 % (ref 0.0–0.2)

## 2024-01-24 LAB — COMPREHENSIVE METABOLIC PANEL
ALT: 20 U/L (ref 0–44)
AST: 16 U/L (ref 15–41)
Albumin: 3.3 g/dL — ABNORMAL LOW (ref 3.5–5.0)
Alkaline Phosphatase: 95 U/L (ref 38–126)
Anion gap: 8 (ref 5–15)
BUN: 11 mg/dL (ref 8–23)
CO2: 29 mmol/L (ref 22–32)
Calcium: 9 mg/dL (ref 8.9–10.3)
Chloride: 99 mmol/L (ref 98–111)
Creatinine, Ser: 0.67 mg/dL (ref 0.61–1.24)
GFR, Estimated: >60 mL/min (ref >60)
Glucose, Bld: 99 mg/dL (ref 70–99)
Potassium: 4.1 mmol/L (ref 3.5–5.1)
Sodium: 136 mmol/L (ref 135–145)
Total Bilirubin: 0.6 mg/dL (ref 0.0–1.2)
Total Protein: 7.7 g/dL (ref 6.5–8.1)

## 2024-01-24 LAB — MAGNESIUM: Magnesium: 1.8 mg/dL (ref 1.7–2.4)

## 2024-01-24 MED ORDER — ZOLEDRONIC ACID 4 MG/100ML IV SOLN
4.0000 mg | Freq: Once | INTRAVENOUS | Status: AC
Start: 2024-01-24 — End: 2024-01-24
  Administered 2024-01-24: 4 mg via INTRAVENOUS
  Filled 2024-01-24: qty 100

## 2024-01-24 MED ORDER — DIPHENOXYLATE-ATROPINE 2.5-0.025 MG PO TABS: 1.0000 | ORAL_TABLET | Freq: Four times a day (QID) | ORAL | 3 refills | Status: AC | PRN

## 2024-01-24 MED ORDER — SODIUM CHLORIDE 0.9 % IV SOLN: Freq: Once | INTRAVENOUS | Status: AC

## 2024-01-24 NOTE — Patient Instructions (Signed)
[facility] at [facility] Discharge Instructions   You were seen and examined today by Dr. Ellin Saba.  He reviewed the results of your lab work which are normal/stable.   We will see you back in 8 weeks. We will repeat a PET scan prior to this visit.    Return as scheduled.    Thank you for choosing [facility] at [facility] to provide your oncology and hematology care.  To afford each patient quality time with our provider, please arrive at least 15 minutes before your scheduled appointment time.   If you have a lab appointment with the Cancer Center please come in thru the Main Entrance and check in at the main information desk.  You need to re-schedule your appointment should you arrive 10 or more minutes late.  We strive to give you quality time with our providers, and arriving late affects you and other patients whose appointments are after yours.  Also, if you no show three or more times for appointments you may be dismissed from the clinic at the providers discretion.     Again, thank you for choosing [facility].  Our hope is that these requests will decrease the amount of time that you wait before being seen by our physicians.       _____________________________________________________________  Should you have questions after your visit to [facility], please contact our office at [phone] and follow the prompts.  Our office hours are 8:00 a.m. and 4:30 p.m. Monday - Friday.  Please note that voicemails left after 4:00 p.m. may not be returned until the following business day.  We are closed weekends and major holidays.  You do have access to a nurse 24-7, just call the main number to the clinic [phone] and do not press any options, hold on the line and a nurse will answer the phone.    For prescription refill requests, have your pharmacy contact our office and allow 72 hours.    Due to Covid, you  will need to wear a mask upon entering the hospital. If you do not have a mask, a mask will be given to you at the Main Entrance upon arrival. For doctor visits, patients may have 1 support person age 5 or older with them. For treatment visits, patients can not have anyone with them due to social distancing guidelines and our immunocompromised population.

## 2024-01-24 NOTE — Progress Notes (Signed)
[facility] [address] [place] [address]    Clinic Day:  01/24/2024  Referring physician: Benita Stabile, MD  Patient Care Team: Benita Stabile, MD as PCP - General (Internal Medicine)   ASSESSMENT & PLAN:   Assessment: 1.  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 Sudie Bailey with a chest x-ray on 03/15/2023, with right pleural effusion. - CT chest (03/17/2023): Right middle lobe mass with complete collapse of the right middle lobe, moderate right pleural effusion, nodularity of the right minor fissure, lytic lesions of the right scapula and left third and fourth ribs.  Multiple enhancing liver lesions concerning for metastatic disease. - PET scan (04/06/2023): Right middle lobe primary bronchogenic carcinoma with mediastinal nodal, bone metastasis and probable upper abdominal nodal metastasis.  Right sided pneumothorax.  Metastasis with right acetabulum and proximal right femur.  Bilateral adrenal hypermetabolism with mild nodularity suspicious for early metastasis.  Right renal mass is not hypermetabolic.  No hepatic hypermetabolism. - Brain MRI (03/30/2023): 1.5 x 1.7 cm lesion in the left frontal lobe.  1.5 cm lesion in the high left frontal lobe.  5 mm lesion in the right frontal lobe.  Few other subcentimeter lesions, total of 10 reported. - Right pleural fluid cytology (04/11/2023): Malignant cells consistent with adenocarcinoma. - RML lung FNA (04/18/2023): Lung adenocarcinoma. - Guardant360: EGFR G719C, EGFR S768I.  MSI-high not detected. - [id number] tissue next (04/26/2023): PD-L1 22 C3 TPS<1%.  EGFR G719C, EGFR S768I, T p53, EGFR amplification, PIK3CA amplification, BRAF amplification, RB1 splice site SNV.  MSI-high not detected. - Afatinib 30 mg daily started on 05/18/2023, dose increased to 40 mg daily on 07/06/2023   2.  Social/family history: - He  lives by himself.  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  He had exposure to chemicals.  He quit smoking in 2012.  Smoked 1 pack/day starting age 89. - Maternal grandmother and maternal uncle had colon cancers.    Plan: 1.  Metastatic adenocarcinoma of the lung to the brain, bones and adrenals: - PET scan (11/23/2023): Stable disease. - He is taking afatinib 40 mg around 1 PM.  He is tolerating well.  He gained about 4 pounds in the last 2 months. - Labs today: Normal LFTs and lites.  CBC was normal. - Continue afatinib 40 mg daily 1 hour before or 2 hours after eating.  RTC 2 months for follow-up with repeat PET CT scan.   2.  Brain metastasis: - Reviewed MRI from 01/09/2024: Decrease of residual enhancement in the 2 previously treated left frontal metastasis.  No new metastasis.   3.  Diarrhea: - Continue Lomotil as needed.  He is requiring twice   4.  Bone metastasis: - He does not have any jaw pains or dental issues.  Calcium today is 9.0.  May proceed with Zometa.   5.  Hypokalemia: - Continue K-Dur 20 mill equivalents daily.  Potassium is normal.    Orders Placed This Encounter  Procedures   NM PET Image Restag (PS) Skull Base To Thigh    Standing Status:   Future    Expected Date:   03/14/2024    Expiration Date:   01/23/2025    If indicated for the ordered procedure, I authorize the administration of a radiopharmaceutical per  Radiology protocol:   Yes    Preferred imaging location?:   [name]      I,Helena R Teague,acting as a scribe for [name], MD.,have documented all relevant documentation on the behalf of [name], MD,as directed by  [name], MD while in the presence of [name], MD.  I, [name] MD, have reviewed the above documentation for accuracy and completeness, and I agree with the above.    [name], MD   2/12/20251:33 PM  CHIEF COMPLAINT:   Diagnosis: metastatic non-small cell adenocarcinoma    Cancer Staging  Lung cancer [facility]) Staging form: Lung, AJCC 8th Edition - Clinical stage from 04/24/2023: Stage IVB (cT1c, cN2, pM1c) - Signed by [name], MD on 05/10/2023    Prior Therapy: palliative XRT to right scapula and right pelvis, 05/04/23 - 05/18/23   Current Therapy:  afatinib    HISTORY OF PRESENT ILLNESS:   Oncology History  Lung cancer (HCC)  04/10/2023 Initial Diagnosis   Lung cancer (HCC)   04/24/2023 Cancer Staging   Staging form: Lung, AJCC 8th Edition - Clinical stage from 04/24/2023: Stage IVB (cT1c, cN2, pM1c) - Signed by [name], MD on 05/10/2023 Histopathologic type: Adenocarcinoma, NOS Stage prefix: Initial diagnosis      INTERVAL HISTORY:   Theodore [name] is a 68 y.o. male presenting to clinic today for follow up of metastatic non-small cell adenocarcinoma. He was last seen by me on 11/29/23.  Since his last visit, he underwent brain MRI on 01/09/24 that found: no evidence of new intracranial metastases and further decrease of residual enhancement at the sites of 2 previously treated left frontal metastases.  Today, he states that he is doing well overall. His appetite level is at 100%. His energy level is at 100%.  PAST MEDICAL HISTORY:   Past Medical History: Past Medical History:  Diagnosis Date   COPD (chronic obstructive pulmonary disease) (HCC)     Surgical History: Past Surgical History:  Procedure Laterality Date   BRONCHIAL BIOPSY  04/18/2023   Procedure: BRONCHIAL BIOPSIES;  Surgeon: Josephine Igo, DO;  Location: [facility];  Service: Pulmonary;;   BRONCHIAL NEEDLE ASPIRATION BIOPSY  04/18/2023   Procedure: BRONCHIAL NEEDLE ASPIRATION BIOPSIES;  Surgeon: Josephine Igo, DO;  Location: [facility] ENDOS[facility];  Service: Pulmonary;;   CHEST TUBE INSERTION Right 04/11/2023   Procedure: CHEST TUBE INSERTION;  Surgeon: Omar Person, MD;  Location: [facility];  Service: Pulmonary;  Laterality: Right;  fentanyl IV only   excision of cyst left ankle     MASS EXCISION Left 08/27/2018   Procedure: EXCISION LIPOMA LEFT BUTTOCK;  Surgeon: Franky Macho, MD;  Location: [facility] ORS;  Service: General;  Laterality: Left;    Social History: Social History   Socioeconomic History   Marital status: Single    Spouse name: Not on file   Number of children: Not on file   Years of education: Not on file   Highest education level: Not on file  Occupational History   Not on file  Tobacco Use   Smoking status: Former    Current packs/day: 0.00    Types: Cigarettes    Quit date: 2012    Years since quitting: 13.1   Smokeless tobacco: Never  Vaping Use   Vaping status: Never Used  Substance and Sexual Activity   Alcohol use: Not Currently    Comment: Quit 1988   Drug use: Not Currently   Sexual activity: Yes    Birth control/protection:  None  Other Topics Concern   Not on file  Social History Narrative   Not on file   Social Drivers of Health   Financial Resource Strain: Not on file  Food Insecurity: Food Insecurity Present (05/02/2023)   Hunger Vital Sign    Worried About Running Out of Food in the Last Year: Never true    Ran Out of Food in the Last Year: Sometimes true  Transportation Needs: No Transportation Needs (05/02/2023)   PRAPARE - Administrator, Civil Service (Medical): No    Lack of Transportation (Non-Medical): No  Physical Activity: Not on file  Stress: Not on file  Social Connections: Not on file  Intimate Partner Violence: Not At Risk (05/02/2023)   Humiliation, Afraid, Rape, and Kick questionnaire    Fear of Current or Ex-Partner: No    Emotionally Abused: No    Physically Abused: No    Sexually Abused: No    Family History: Family History  Problem Relation Age of Onset   Colon cancer Maternal Grandmother    Colon cancer Maternal Uncle     Current  Medications:  Current Outpatient Medications:    acetaminophen (TYLENOL) 325 MG tablet, Take 2 tablets (650 mg total) by mouth every 6 (six) hours as needed for mild pain, moderate pain, headache or fever (or Fever >/= 101)., Disp: , Rfl:    afatinib dimaleate (GILOTRIF) 40 MG tablet, Take 1 tablet (40 mg total) by mouth daily. Take on an empty stomach 1hr before or 2hrs after meals., Disp: 30 tablet, Rfl: 3   albuterol (PROVENTIL HFA;VENTOLIN HFA) 108 (90 Base) MCG/ACT inhaler, Inhale 2 puffs into the lungs every 4 (four) hours as needed for wheezing or shortness of breath., Disp: , Rfl:    BREZTRI AEROSPHERE 160-9-4.8 MCG/ACT AERO, SMARTSIG:2 Puff(s) By Mouth Twice Daily, Disp: , Rfl:    diphenoxylate-atropine (LOMOTIL) 2.5-0.025 MG tablet, Take 1 tablet by mouth 4 (four) times daily as needed for diarrhea or loose stools., Disp: 60 tablet, Rfl: 1   folic acid (FOLVITE) 1 MG tablet, TAKE 1 TABLET BY MOUTH EVERY DAY, Disp: 90 tablet, Rfl: 3   HYDROCORTISONE, TOPICAL, 2.5 % SOLN, Apply topically twice daily., Disp: 30 mL, Rfl: 0   ibuprofen (ADVIL) 200 MG tablet, Take 400 mg by mouth every 6 (six) hours as needed for moderate pain., Disp: , Rfl:    LORazepam (ATIVAN) 0.5 MG tablet, 1 tab po 30 minutes prior to radiation or MRI scans, Disp: 30 tablet, Rfl: 0   potassium chloride SA (KLOR-CON M) 20 MEQ tablet, Take 1 tablet (20 mEq total) by mouth daily., Disp: 90 tablet, Rfl: 3   Allergies: No Active Allergies  REVIEW OF SYSTEMS:   Review of Systems  Constitutional:  Negative for chills, fatigue and fever.  HENT:   Negative for lump/mass, mouth sores, nosebleeds, sore throat and trouble swallowing.   Eyes:  Negative for eye problems.  Respiratory:  Positive for shortness of breath. Negative for cough.   Cardiovascular:  Negative for chest pain, leg swelling and palpitations.  Gastrointestinal:  Negative for abdominal pain, constipation, diarrhea, nausea and vomiting.  Genitourinary:   Negative for bladder incontinence, difficulty urinating, dysuria, frequency, hematuria and nocturia.   Musculoskeletal:  Negative for arthralgias, back pain, flank pain, myalgias and neck pain.  Skin:  Negative for itching and rash.  Neurological:  Positive for headaches and numbness. Negative for dizziness.  Hematological:  Does not bruise/bleed easily.  Psychiatric/Behavioral:  Negative for depression, sleep  disturbance and suicidal ideas. The patient is not nervous/anxious.   All other systems reviewed and are negative.    VITALS:   Blood pressure 119/75, pulse (!) 106, temperature 97.9 F (36.6 C), temperature source Tympanic, resp. rate 19, height 5\' 9"  (1.753 m), weight 116 lb (52.6 kg), SpO2 99%.  Wt Readings from Last 3 Encounters:  01/24/24 116 lb (52.6 kg)  11/29/23 112 lb 6.4 oz (51 kg)  11/01/23 118 lb 9.6 oz (53.8 kg)    Body mass index is 17.13 kg/m.  Performance status (ECOG): 1 - Symptomatic but completely ambulatory  PHYSICAL EXAM:   Physical Exam Vitals and nursing note reviewed. Exam conducted with a chaperone present.  Constitutional:      Appearance: Normal appearance.  Cardiovascular:     Rate and Rhythm: Normal rate and regular rhythm.     Pulses: Normal pulses.     Heart sounds: Normal heart sounds.  Pulmonary:     Effort: Pulmonary effort is normal.     Breath sounds: Normal breath sounds.  Abdominal:     Palpations: Abdomen is soft. There is no hepatomegaly, splenomegaly or mass.     Tenderness: There is no abdominal tenderness.  Musculoskeletal:     Right lower leg: No edema.     Left lower leg: No edema.  Lymphadenopathy:     Cervical: No cervical adenopathy.     Right cervical: No superficial, deep or posterior cervical adenopathy.    Left cervical: No superficial, deep or posterior cervical adenopathy.     Upper Body:     Right upper body: No supraclavicular or axillary adenopathy.     Left upper body: No supraclavicular or axillary  adenopathy.  Neurological:     General: No focal deficit present.     Mental Status: He is alert and oriented to person, place, and time.  Psychiatric:        Mood and Affect: Mood normal.        Behavior: Behavior normal.     LABS:      Latest Ref Rng & Units 01/24/2024   12:01 PM 12/27/2023    1:03 PM 11/29/2023   11:25 AM  CBC  WBC 4.0 - 10.5 K/uL 6.8  6.8  6.1   Hemoglobin 13.0 - 17.0 g/dL 54.0  98.1  19.1   Hematocrit 39.0 - 52.0 % 39.0  37.4  40.8   Platelets 150 - 400 K/uL 352  333  349       Latest Ref Rng & Units 01/24/2024   12:01 PM 12/27/2023    1:03 PM 11/29/2023   11:25 AM  CMP  Glucose 70 - 99 mg/dL 99  96  97   BUN 8 - 23 mg/dL 11  11  9    Creatinine 0.61 - 1.24 mg/dL 4.78  2.95  6.21   Sodium 135 - 145 mmol/L 136  134  136   Potassium 3.5 - 5.1 mmol/L 4.1  4.0  3.1   Chloride 98 - 111 mmol/L 99  99  100   CO2 22 - 32 mmol/L 29  27  26    Calcium 8.9 - 10.3 mg/dL 9.0  8.8  8.7   Total Protein 6.5 - 8.1 g/dL 7.7  7.5  8.0   Total Bilirubin 0.0 - 1.2 mg/dL 0.6  0.7  0.6   Alkaline Phos 38 - 126 U/L 95  97  97   AST 15 - 41 U/L 16  19  18  ALT 0 - 44 U/L 20  23  21       No results found for: "CEA1", "CEA" / No results found for: "CEA1", "CEA" No results found for: "PSA1" No results found for: "ZOX096" No results found for: "CAN125"  No results found for: "TOTALPROTELP", "ALBUMINELP", "A1GS", "A2GS", "BETS", "BETA2SER", "GAMS", "MSPIKE", "SPEI" No results found for: "TIBC", "FERRITIN", "IRONPCTSAT" Lab Results  Component Value Date   LDH 197 (H) 04/12/2023     STUDIES:   MR Brain W Wo Contrast Result Date: 01/09/2024 CLINICAL DATA:  Brain metastases, assess treatment response. History of metastatic lung cancer with brain and bone metastases. Status post whole brain radiation. EXAM: MRI HEAD WITHOUT AND WITH CONTRAST TECHNIQUE: Multiplanar, multiecho pulse sequences of the brain and surrounding structures were obtained without and with intravenous  contrast. CONTRAST:  5 mL Vueway COMPARISON:  Head MRI 08/30/2023 FINDINGS: Brain: The amount of residual enhancement at the sites of 2 previously treated left frontal metastases has mildly decreased further, with the larger residual lesion measuring 10 mm (series 13, image 98). There is persistent mild T2 hyperintensity/gliosis and chronic blood products at both of these sites. No new enhancing brain lesion, acute infarct, midline shift, or extra-axial fluid collection is identified. The ventricles are normal in size. Small T2 hyperintensities in the cerebral white matter bilaterally are nonspecific but compatible with mild chronic small vessel ischemic disease. Vascular: Major intracranial vascular flow voids are preserved. Skull and upper cervical spine: No suspicious marrow lesion. Spondylosis at C3-4 and C4-5. Sinuses/Orbits: Unremarkable orbits. Persistent moderate mucosal thickening in the ethmoid sinuses and mild thickening in the other sinuses. New moderate fluid level in the right maxillary sinus. Trace left mastoid fluid. Other: None. IMPRESSION: 1. Further decrease of residual enhancement at the sites of 2 previously treated left frontal metastases. 2. No evidence of new intracranial metastases. Electronically Signed   By: Sebastian Ache M.D.   On: 01/09/2024 15:36

## 2024-01-24 NOTE — Progress Notes (Signed)
Patient tolerated Zometa infusion with no complaints voiced. Labs reviewed. Side effects with management reviewed with understanding verbalized. Peripheral IV site clean and dry with no bruising or swelling noted at site. Good blood return noted before and after administration of therapy. Band aid applied. Patient left in satisfactory condition with VSS and no s/s of distress noted. All follow ups as scheduled.   Myriah Boggus [employer]

## 2024-01-24 NOTE — Patient Instructions (Signed)

## 2024-01-24 NOTE — Progress Notes (Signed)
Patient is taking Gilotrif as prescribed. He has not missed any doses and reports no side effects at this time.

## 2024-02-07 ENCOUNTER — Other Ambulatory Visit (HOSPITAL_COMMUNITY): Payer: Self-pay

## 2024-02-07 ENCOUNTER — Other Ambulatory Visit: Payer: Self-pay

## 2024-02-07 ENCOUNTER — Other Ambulatory Visit: Payer: Self-pay | Admitting: Hematology

## 2024-02-07 DIAGNOSIS — C342 Malignant neoplasm of middle lobe, bronchus or lung: Secondary | ICD-10-CM

## 2024-02-07 MED ORDER — AFATINIB DIMALEATE 40 MG PO TABS
40.0000 mg | ORAL_TABLET | Freq: Every day | ORAL | 3 refills | Status: DC
  Filled 2024-02-07: qty 30, 30d supply, fill #0
  Filled 2024-03-12 (×2): qty 30, 30d supply, fill #1
  Filled 2024-04-12: qty 30, 30d supply, fill #2
  Filled 2024-05-20: qty 30, 30d supply, fill #3

## 2024-02-07 NOTE — Progress Notes (Signed)
 Specialty Pharmacy Refill Coordination Note  Theodore [name] is a 68 y.o. male contacted today regarding refills of specialty medication(s) Afatinib Dimaleate (GILOTRIF)   Patient requested Delivery   Delivery date: 02/15/24   Verified address: [address]   [place] [address]   Medication will be filled on 02/14/24.   This fill date is pending response to refill request from provider. Patient is aware and if they have not received fill by intended date, they must follow up with pharmacy.

## 2024-02-20 ENCOUNTER — Other Ambulatory Visit: Payer: Self-pay

## 2024-02-20 DIAGNOSIS — C7951 Secondary malignant neoplasm of bone: Secondary | ICD-10-CM

## 2024-02-20 DIAGNOSIS — C3490 Malignant neoplasm of unspecified part of unspecified bronchus or lung: Secondary | ICD-10-CM

## 2024-02-21 ENCOUNTER — Inpatient Hospital Stay: Payer: Medicare Other

## 2024-02-21 ENCOUNTER — Inpatient Hospital Stay: Payer: Medicare Other | Attending: Hematology

## 2024-02-21 VITALS — BP 137/75 | HR 87 | Temp 98.0°F | Resp 18 | Wt 111.8 lb

## 2024-02-21 DIAGNOSIS — C342 Malignant neoplasm of middle lobe, bronchus or lung: Secondary | ICD-10-CM | POA: Diagnosis present

## 2024-02-21 DIAGNOSIS — C3490 Malignant neoplasm of unspecified part of unspecified bronchus or lung: Secondary | ICD-10-CM

## 2024-02-21 DIAGNOSIS — C7951 Secondary malignant neoplasm of bone: Secondary | ICD-10-CM | POA: Insufficient documentation

## 2024-02-21 DIAGNOSIS — E876 Hypokalemia: Secondary | ICD-10-CM

## 2024-02-21 LAB — CBC WITH DIFFERENTIAL/PLATELET
Abs Immature Granulocytes: 0.02 10*3/uL (ref 0.00–0.07)
Basophils Absolute: 0 10*3/uL (ref 0.0–0.1)
Basophils Relative: 0 %
Eosinophils Absolute: 0.1 10*3/uL (ref 0.0–0.5)
Eosinophils Relative: 2 %
HCT: 38.9 % — ABNORMAL LOW (ref 39.0–52.0)
Hemoglobin: 12.7 g/dL — ABNORMAL LOW (ref 13.0–17.0)
Immature Granulocytes: 0 %
Lymphocytes Relative: 25 %
Lymphs Abs: 1.7 10*3/uL (ref 0.7–4.0)
MCH: 28.5 pg (ref 26.0–34.0)
MCHC: 32.6 g/dL (ref 30.0–36.0)
MCV: 87.2 fL (ref 80.0–100.0)
Monocytes Absolute: 0.7 10*3/uL (ref 0.1–1.0)
Monocytes Relative: 10 %
Neutro Abs: 4.3 10*3/uL (ref 1.7–7.7)
Neutrophils Relative %: 63 %
Platelets: 376 10*3/uL (ref 150–400)
RBC: 4.46 MIL/uL (ref 4.22–5.81)
RDW: 15.9 % — ABNORMAL HIGH (ref 11.5–15.5)
WBC: 6.8 10*3/uL (ref 4.0–10.5)
nRBC: 0 % (ref 0.0–0.2)

## 2024-02-21 LAB — COMPREHENSIVE METABOLIC PANEL WITH GFR
ALT: 17 U/L (ref 0–44)
AST: 17 U/L (ref 15–41)
Albumin: 3.4 g/dL — ABNORMAL LOW (ref 3.5–5.0)
Alkaline Phosphatase: 95 U/L (ref 38–126)
Anion gap: 10 (ref 5–15)
BUN: 8 mg/dL (ref 8–23)
CO2: 26 mmol/L (ref 22–32)
Calcium: 8.7 mg/dL — ABNORMAL LOW (ref 8.9–10.3)
Chloride: 100 mmol/L (ref 98–111)
Creatinine, Ser: 0.7 mg/dL (ref 0.61–1.24)
GFR, Estimated: >60 mL/min
Glucose, Bld: 103 mg/dL — ABNORMAL HIGH (ref 70–99)
Potassium: 3.4 mmol/L — ABNORMAL LOW (ref 3.5–5.1)
Sodium: 136 mmol/L (ref 135–145)
Total Bilirubin: 0.6 mg/dL (ref 0.0–1.2)
Total Protein: 7.7 g/dL (ref 6.5–8.1)

## 2024-02-21 LAB — MAGNESIUM: Magnesium: 1.7 mg/dL (ref 1.7–2.4)

## 2024-02-21 MED ORDER — POTASSIUM CHLORIDE CRYS ER 20 MEQ PO TBCR
40.0000 meq | EXTENDED_RELEASE_TABLET | Freq: Once | ORAL | Status: AC
  Administered 2024-02-21: 40 meq via ORAL
  Filled 2024-02-21: qty 2

## 2024-02-21 MED ORDER — SODIUM CHLORIDE 0.9 % IV SOLN: Freq: Once | INTRAVENOUS | Status: AC

## 2024-02-21 MED ORDER — ZOLEDRONIC ACID 4 MG/100ML IV SOLN
4.0000 mg | Freq: Once | INTRAVENOUS | Status: AC
  Administered 2024-02-21: 4 mg via INTRAVENOUS
  Filled 2024-02-21: qty 100

## 2024-02-21 NOTE — Progress Notes (Signed)
 Patient presents today for Zometa, Potassium 3.4, 40 mEq of PO potassium given per standing orders. Patient tolerated therapy with no complaints voiced. Labs reviewed. Side effects with management reviewed with understanding verbalized. Peripheral IV site clean and dry with no bruising or swelling noted at site. Good blood return noted before and after administration of therapy. Band aid applied. Patient left in satisfactory condition with VSS and no s/s of distress noted.

## 2024-02-21 NOTE — Patient Instructions (Signed)
 [facility] [place] - A DEPT OF [facility]  Discharge Instructions: Thank you for choosing [facility] to provide your oncology and hematology care.  If you have a lab appointment with the Cancer Center - please note that after April 8th, 2024, all labs will be drawn in the cancer center.  You do not have to check in or register with the main entrance as you have in the past but will complete your check-in in the cancer center.  Wear comfortable clothing and clothing appropriate for easy access to any Portacath or PICC line.   We strive to give you quality time with your provider. You may need to reschedule your appointment if you arrive late (15 or more minutes).  Arriving late affects you and other patients whose appointments are after yours.  Also, if you miss three or more appointments without notifying the office, you may be dismissed from the clinic at the provider's discretion.      For prescription refill requests, have your pharmacy contact our office and allow 72 hours for refills to be completed.    Today you received the following Zometa, return as scheduled.   To help prevent nausea and vomiting after your treatment, we encourage you to take your nausea medication as directed.  BELOW ARE SYMPTOMS THAT SHOULD BE REPORTED IMMEDIATELY: *FEVER GREATER THAN 100.4 F (38 C) OR HIGHER *CHILLS OR SWEATING *NAUSEA AND VOMITING THAT IS NOT CONTROLLED WITH YOUR NAUSEA MEDICATION *UNUSUAL SHORTNESS OF BREATH *UNUSUAL BRUISING OR BLEEDING *URINARY PROBLEMS (pain or burning when urinating, or frequent urination) *BOWEL PROBLEMS (unusual diarrhea, constipation, pain near the anus) TENDERNESS IN MOUTH AND THROAT WITH OR WITHOUT PRESENCE OF ULCERS (sore throat, sores in mouth, or a toothache) UNUSUAL RASH, SWELLING OR PAIN  UNUSUAL VAGINAL DISCHARGE OR ITCHING   Items with * indicate a potential emergency and should be followed up as soon as possible or  go to the Emergency Department if any problems should occur.  Please show the CHEMOTHERAPY ALERT CARD or IMMUNOTHERAPY ALERT CARD at check-in to the Emergency Department and triage nurse.  Should you have questions after your visit or need to cancel or reschedule your appointment, please contact [facility] [place] - A DEPT OF [facility] [phone]  and follow the prompts.  Office hours are 8:00 a.m. to 4:30 p.m. Monday - Friday. Please note that voicemails left after 4:00 p.m. may not be returned until the following business day.  We are closed weekends and major holidays. You have access to a nurse at all times for urgent questions. Please call the main number to the clinic [phone] and follow the prompts.  For any non-urgent questions, you may also contact your provider using MyChart. We now offer e-Visits for anyone 106 and older to request care online for non-urgent symptoms. For details visit [email or web address].   Also download the MyChart app! Go to the app store, search "MyChart", open the app, select [place], and log in with your MyChart username and password.

## 2024-03-12 ENCOUNTER — Other Ambulatory Visit: Payer: Self-pay

## 2024-03-12 ENCOUNTER — Other Ambulatory Visit (HOSPITAL_COMMUNITY): Payer: Self-pay

## 2024-03-12 NOTE — Progress Notes (Signed)
 Specialty Pharmacy Refill Coordination Note  Theodore [name] is a 68 y.o. male contacted today regarding refills of specialty medication(s) No data recorded  Patient requested (Patient-Rptd) Delivery   Delivery date: (Patient-Rptd) 08/01/55   [address]pt[address] [place] [address]   Medication will be filled on 03/19/24. New delivery date is 03/20/24. Patient has been notified.

## 2024-03-14 ENCOUNTER — Ambulatory Visit (HOSPITAL_COMMUNITY)
Admission: RE | Admit: 2024-03-14 | Discharge: 2024-03-14 | Disposition: A | Discharge status: Home / Self Care | Payer: Medicare Other | Source: Ambulatory Visit | Attending: Hematology | Admitting: Hematology

## 2024-03-14 DIAGNOSIS — C3490 Malignant neoplasm of unspecified part of unspecified bronchus or lung: Secondary | ICD-10-CM | POA: Insufficient documentation

## 2024-03-20 ENCOUNTER — Inpatient Hospital Stay: Payer: Medicare Other

## 2024-03-20 ENCOUNTER — Inpatient Hospital Stay: Payer: Medicare Other | Admitting: Hematology

## 2024-03-21 ENCOUNTER — Ambulatory Visit (HOSPITAL_COMMUNITY)
Admission: RE | Admit: 2024-03-21 | Discharge: 2024-03-21 | Disposition: A | Discharge status: Home / Self Care | Source: Ambulatory Visit | Attending: Hematology | Admitting: Hematology

## 2024-03-21 DIAGNOSIS — J90 Pleural effusion, not elsewhere classified: Secondary | ICD-10-CM | POA: Diagnosis not present

## 2024-03-21 DIAGNOSIS — C3491 Malignant neoplasm of unspecified part of right bronchus or lung: Secondary | ICD-10-CM | POA: Diagnosis not present

## 2024-03-21 DIAGNOSIS — C3490 Malignant neoplasm of unspecified part of unspecified bronchus or lung: Secondary | ICD-10-CM | POA: Diagnosis present

## 2024-03-21 DIAGNOSIS — C7951 Secondary malignant neoplasm of bone: Secondary | ICD-10-CM | POA: Diagnosis not present

## 2024-03-21 MED ORDER — FLUDEOXYGLUCOSE F - 18 (FDG) INJECTION
5.6500 | Freq: Once | INTRAVENOUS | Status: AC | PRN
  Administered 2024-03-21: 5.65 via INTRAVENOUS

## 2024-03-26 ENCOUNTER — Other Ambulatory Visit: Payer: Self-pay

## 2024-03-26 DIAGNOSIS — C3490 Malignant neoplasm of unspecified part of unspecified bronchus or lung: Secondary | ICD-10-CM

## 2024-03-27 ENCOUNTER — Inpatient Hospital Stay: Admitting: Hematology

## 2024-03-27 ENCOUNTER — Inpatient Hospital Stay

## 2024-03-27 ENCOUNTER — Inpatient Hospital Stay: Attending: Hematology

## 2024-03-27 VITALS — BP 119/81 | HR 82 | Temp 97.6°F | Resp 18 | Ht 69.0 in | Wt 110.0 lb

## 2024-03-27 DIAGNOSIS — C3491 Malignant neoplasm of unspecified part of right bronchus or lung: Secondary | ICD-10-CM | POA: Diagnosis not present

## 2024-03-27 DIAGNOSIS — C342 Malignant neoplasm of middle lobe, bronchus or lung: Secondary | ICD-10-CM | POA: Insufficient documentation

## 2024-03-27 DIAGNOSIS — E876 Hypokalemia: Secondary | ICD-10-CM | POA: Insufficient documentation

## 2024-03-27 DIAGNOSIS — C7931 Secondary malignant neoplasm of brain: Secondary | ICD-10-CM | POA: Insufficient documentation

## 2024-03-27 DIAGNOSIS — C7951 Secondary malignant neoplasm of bone: Secondary | ICD-10-CM

## 2024-03-27 DIAGNOSIS — C7971 Secondary malignant neoplasm of right adrenal gland: Secondary | ICD-10-CM | POA: Diagnosis not present

## 2024-03-27 DIAGNOSIS — Z79899 Other long term (current) drug therapy: Secondary | ICD-10-CM | POA: Insufficient documentation

## 2024-03-27 DIAGNOSIS — Z87891 Personal history of nicotine dependence: Secondary | ICD-10-CM | POA: Diagnosis not present

## 2024-03-27 DIAGNOSIS — C7972 Secondary malignant neoplasm of left adrenal gland: Secondary | ICD-10-CM | POA: Diagnosis not present

## 2024-03-27 DIAGNOSIS — C3490 Malignant neoplasm of unspecified part of unspecified bronchus or lung: Secondary | ICD-10-CM

## 2024-03-27 LAB — CBC WITH DIFFERENTIAL/PLATELET
Abs Immature Granulocytes: 0.02 10*3/uL (ref 0.00–0.07)
Basophils Absolute: 0 10*3/uL (ref 0.0–0.1)
Basophils Relative: 0 %
Eosinophils Absolute: 0.1 10*3/uL (ref 0.0–0.5)
Eosinophils Relative: 2 %
HCT: 40.6 % (ref 39.0–52.0)
Hemoglobin: 12.9 g/dL — ABNORMAL LOW (ref 13.0–17.0)
Immature Granulocytes: 0 %
Lymphocytes Relative: 23 %
Lymphs Abs: 1.6 10*3/uL (ref 0.7–4.0)
MCH: 27.3 pg (ref 26.0–34.0)
MCHC: 31.8 g/dL (ref 30.0–36.0)
MCV: 86 fL (ref 80.0–100.0)
Monocytes Absolute: 0.8 10*3/uL (ref 0.1–1.0)
Monocytes Relative: 11 %
Neutro Abs: 4.5 10*3/uL (ref 1.7–7.7)
Neutrophils Relative %: 64 %
Platelets: 409 10*3/uL — ABNORMAL HIGH (ref 150–400)
RBC: 4.72 MIL/uL (ref 4.22–5.81)
RDW: 15.4 % (ref 11.5–15.5)
WBC: 7 10*3/uL (ref 4.0–10.5)
nRBC: 0 % (ref 0.0–0.2)

## 2024-03-27 LAB — COMPREHENSIVE METABOLIC PANEL WITH GFR
ALT: 38 U/L (ref 0–44)
AST: 20 U/L (ref 15–41)
Albumin: 3.3 g/dL — ABNORMAL LOW (ref 3.5–5.0)
Alkaline Phosphatase: 140 U/L — ABNORMAL HIGH (ref 38–126)
Anion gap: 12 (ref 5–15)
BUN: 12 mg/dL (ref 8–23)
CO2: 26 mmol/L (ref 22–32)
Calcium: 9 mg/dL (ref 8.9–10.3)
Chloride: 97 mmol/L — ABNORMAL LOW (ref 98–111)
Creatinine, Ser: 0.65 mg/dL (ref 0.61–1.24)
GFR, Estimated: >60 mL/min (ref >60)
Glucose, Bld: 95 mg/dL (ref 70–99)
Potassium: 3.8 mmol/L (ref 3.5–5.1)
Sodium: 135 mmol/L (ref 135–145)
Total Bilirubin: 0.5 mg/dL (ref 0.0–1.2)
Total Protein: 7.9 g/dL (ref 6.5–8.1)

## 2024-03-27 LAB — MAGNESIUM: Magnesium: 1.8 mg/dL (ref 1.7–2.4)

## 2024-03-27 MED ORDER — SODIUM CHLORIDE 0.9 % IV SOLN: Freq: Once | INTRAVENOUS | Status: AC

## 2024-03-27 MED ORDER — ZOLEDRONIC ACID 4 MG/100ML IV SOLN
4.0000 mg | Freq: Once | INTRAVENOUS | Status: AC
Start: 2024-03-27 — End: 2024-03-27
  Administered 2024-03-27: 4 mg via INTRAVENOUS
  Filled 2024-03-27: qty 100

## 2024-03-27 NOTE — Progress Notes (Signed)
 [facility] [address], [place] [address]    Clinic Day:  03/27/2024  Referring physician: Benita Stabile, MD  Patient Care Team: Benita Stabile, MD as PCP - General (Internal Medicine)   ASSESSMENT & PLAN:   Assessment: 1.  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 Sudie Bailey with a chest x-ray on 03/15/2023, with right pleural effusion. - CT chest (03/17/2023): Right middle lobe mass with complete collapse of the right middle lobe, moderate right pleural effusion, nodularity of the right minor fissure, lytic lesions of the right scapula and left third and fourth ribs.  Multiple enhancing liver lesions concerning for metastatic disease. - PET scan (04/06/2023): Right middle lobe primary bronchogenic carcinoma with mediastinal nodal, bone metastasis and probable upper abdominal nodal metastasis.  Right sided pneumothorax.  Metastasis with right acetabulum and proximal right femur.  Bilateral adrenal hypermetabolism with mild nodularity suspicious for early metastasis.  Right renal mass is not hypermetabolic.  No hepatic hypermetabolism. - Brain MRI (03/30/2023): 1.5 x 1.7 cm lesion in the left frontal lobe.  1.5 cm lesion in the high left frontal lobe.  5 mm lesion in the right frontal lobe.  Few other subcentimeter lesions, total of 10 reported. - Right pleural fluid cytology (04/11/2023): Malignant cells consistent with adenocarcinoma. - RML lung FNA (04/18/2023): Lung adenocarcinoma. - Guardant360: EGFR G719C, EGFR S768I.  MSI-high not detected. - [id number] tissue next (04/26/2023): PD-L1 22 C3 TPS<1%.  EGFR G719C, EGFR S768I, T p53, EGFR amplification, PIK3CA amplification, BRAF amplification, RB1 splice site SNV.  MSI-high not detected. - Afatinib 30 mg daily started on 05/18/2023, dose increased to 40 mg daily on 07/06/2023   2.  Social/family history: - He  lives by himself.  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  He had exposure to chemicals.  He quit smoking in 2012.  Smoked 1 pack/day starting age 43. - Maternal grandmother and maternal uncle had colon cancers.    Plan: 1.  Metastatic adenocarcinoma of the lung to the brain, bones and adrenals: - He is tolerating afatinib very well.  He takes it around 1 PM. - Labs today: Normal LFTs with mildly elevated alk phos.  CBC grossly normal. - Reviewed PET scan from 03/31/2024: Mildly increased metabolic activity in the left medial clavicle head.  Increasing metabolic activity associated with peribronchial nodular thickening in the anterior right lung/middle lobe.  Stable right hilar and mediastinal adenopathy. - Both areas are not new and have mildly increased uptake.  Hence have recommended continuing current therapy.  I will reevaluate him in 2 months and schedule him for another scan in 3 months.   2.  Brain metastasis: - MRI brain on 01/09/2024 showed decreased size of residual enhancement in the 2 previously treated metastasis.  He is scheduled for another MRI soon.   3.  Diarrhea: - Continue Lomotil as needed.  Not requiring regularly.   4.  Bone metastasis: - He does not have any jaw pains or dental issues.  Calcium today is 9.0.  Continue Zometa.   5.  Hypokalemia: - Continue K-Dur 20 mEq daily.  Potassium is normal.    No orders of the defined types were placed in this encounter.     Alben Deeds Teague,acting as a Neurosurgeon for [name], MD.,have  documented all relevant documentation on the behalf of [name], MD,as directed by  [name], MD while in the presence of [name], MD.  I, [name] MD, have reviewed the above documentation for accuracy and completeness, and I agree with the above.     [name], MD    4/16/20255:22 PM  CHIEF COMPLAINT:   Diagnosis: metastatic non-small cell adenocarcinoma    Cancer Staging  Lung cancer [facility]) Staging form: Lung, AJCC 8th Edition - Clinical stage from 04/24/2023: Stage IVB (cT1c, cN2, pM1c) - Signed by [name], MD on 05/10/2023    Prior Therapy: palliative XRT to right scapula and right pelvis, 05/04/23 - 05/18/23   Current Therapy:  afatinib    HISTORY OF PRESENT ILLNESS:   Oncology History  Lung cancer (HCC)  04/10/2023 Initial Diagnosis   Lung cancer (HCC)   04/24/2023 Cancer Staging   Staging form: Lung, AJCC 8th Edition - Clinical stage from 04/24/2023: Stage IVB (cT1c, cN2, pM1c) - Signed by [name], MD on 05/10/2023 Histopathologic type: Adenocarcinoma, NOS Stage prefix: Initial diagnosis      INTERVAL HISTORY:   Theodore [name] is a 68 y.o. male presenting to clinic today for follow up of metastatic non-small cell adenocarcinoma. He was last seen by me on 01/24/24.  Since his last visit, he underwent restaging PET on 03/21/24 that found:  Increasing metabolic activity associated with peribronchial nodular thickening in the anterior RIGHT lung/RIGHT middle lobe. Findings concerning for local lung recurrence. Stable large layering RIGHT pleural effusion. Stable mild metabolic activity associated with small RIGHT hilar and mediastinal lymph nodes. Indeterminate activity. Favor reactive. Increasing metabolic activity associated with sclerotic lesion in the medial head of the LEFT clavicle. Findings concerning for recurrent osseous metastasis. Sclerotic lesions in the pelvis are not hypermetabolic consistent treated metastasis. No evidence of metastatic disease in the abdomen pelvis.  Today, he states that he is doing well overall. His appetite level is at 100%. His energy level is at 80%.  PAST MEDICAL HISTORY:   Past Medical History: Past Medical History:  Diagnosis Date   COPD (chronic obstructive pulmonary disease) (HCC)      Surgical History: Past Surgical History:  Procedure Laterality Date   BRONCHIAL BIOPSY  04/18/2023   Procedure: BRONCHIAL BIOPSIES;  Surgeon: Prudy Brownie, DO;  Location: [facility];  Service: Pulmonary;;   BRONCHIAL NEEDLE ASPIRATION BIOPSY  04/18/2023   Procedure: BRONCHIAL NEEDLE ASPIRATION BIOPSIES;  Surgeon: Prudy Brownie, DO;  Location: [facility];  Service: Pulmonary;;   CHEST TUBE INSERTION Right 04/11/2023   Procedure: CHEST TUBE INSERTION;  Surgeon: Gloriajean Large, MD;  Location: [facility] ENDOSCOPY;  Service: Pulmonary;  Laterality: Right;  fentanyl IV only   excision of cyst left ankle     MASS EXCISION Left 08/27/2018   Procedure: EXCISION LIPOMA LEFT BUTTOCK;  Surgeon: Alanda Allegra, MD;  Location: [facility];  Service: General;  Laterality: Left;    Social History: Social History   Socioeconomic History   Marital status: Single    Spouse name: Not on file   Number of children: Not on file   Years of education: Not on file   Highest education level: Not on file  Occupational History   Not on file  Tobacco Use   Smoking status: Former    Current packs/day: 0.00    Types: Cigarettes    Quit date: 2012    Years since quitting: 13.2   Smokeless tobacco: Never  Vaping Use   Vaping status:  Never Used  Substance and Sexual Activity   Alcohol use: Not Currently    Comment: Quit 1988   Drug use: Not Currently   Sexual activity: Yes    Birth control/protection: None  Other Topics Concern   Not on file  Social History Narrative   Not on file   Social Drivers of Health   Financial Resource Strain: Not on file  Food Insecurity: Food Insecurity Present (05/02/2023)   Hunger Vital Sign    Worried About Running Out of Food in the Last Year: Never true    Ran Out of Food in the Last Year: Sometimes true  Transportation Needs: No Transportation Needs (05/02/2023)   PRAPARE - Administrator, Civil Service (Medical): No    Lack of Transportation  (Non-Medical): No  Physical Activity: Not on file  Stress: Not on file  Social Connections: Not on file  Intimate Partner Violence: Not At Risk (05/02/2023)   Humiliation, Afraid, Rape, and Kick questionnaire    Fear of Current or Ex-Partner: No    Emotionally Abused: No    Physically Abused: No    Sexually Abused: No    Family History: Family History  Problem Relation Age of Onset   Colon cancer Maternal Grandmother    Colon cancer Maternal Uncle     Current Medications:  Current Outpatient Medications:    acetaminophen (TYLENOL) 325 MG tablet, Take 2 tablets (650 mg total) by mouth every 6 (six) hours as needed for mild pain, moderate pain, headache or fever (or Fever >/= 101)., Disp: , Rfl:    afatinib dimaleate (GILOTRIF) 40 MG tablet, Take 1 tablet (40 mg total) by mouth daily. Take on an empty stomach 1hr before or 2hrs after meals., Disp: 30 tablet, Rfl: 3   albuterol (PROVENTIL HFA;VENTOLIN HFA) 108 (90 Base) MCG/ACT inhaler, Inhale 2 puffs into the lungs every 4 (four) hours as needed for wheezing or shortness of breath., Disp: , Rfl:    BREZTRI AEROSPHERE 160-9-4.8 MCG/ACT AERO, SMARTSIG:2 Puff(s) By Mouth Twice Daily, Disp: , Rfl:    diphenoxylate-atropine (LOMOTIL) 2.5-0.025 MG tablet, Take 1 tablet by mouth 4 (four) times daily as needed for diarrhea or loose stools., Disp: 120 tablet, Rfl: 3   folic acid (FOLVITE) 1 MG tablet, TAKE 1 TABLET BY MOUTH EVERY DAY, Disp: 90 tablet, Rfl: 3   HYDROCORTISONE, TOPICAL, 2.5 % SOLN, Apply topically twice daily., Disp: 30 mL, Rfl: 0   ibuprofen (ADVIL) 200 MG tablet, Take 400 mg by mouth every 6 (six) hours as needed for moderate pain., Disp: , Rfl:    LORazepam (ATIVAN) 0.5 MG tablet, 1 tab po 30 minutes prior to radiation or MRI scans, Disp: 30 tablet, Rfl: 0   potassium chloride SA (KLOR-CON M) 20 MEQ tablet, Take 1 tablet (20 mEq total) by mouth daily., Disp: 90 tablet, Rfl: 3   Allergies: No Active Allergies  REVIEW OF  SYSTEMS:   Review of Systems  Constitutional:  Negative for chills, fatigue and fever.  HENT:   Negative for lump/mass, mouth sores, nosebleeds, sore throat and trouble swallowing.   Eyes:  Negative for eye problems.  Respiratory:  Negative for cough and shortness of breath.   Cardiovascular:  Negative for chest pain, leg swelling and palpitations.  Gastrointestinal:  Positive for diarrhea. Negative for abdominal pain, constipation, nausea and vomiting.  Genitourinary:  Negative for bladder incontinence, difficulty urinating, dysuria, frequency, hematuria and nocturia.   Musculoskeletal:  Negative for arthralgias, back pain, flank pain, myalgias  and neck pain.  Skin:  Negative for itching and rash.  Neurological:  Positive for numbness. Negative for dizziness and headaches.  Hematological:  Does not bruise/bleed easily.  Psychiatric/Behavioral:  Positive for sleep disturbance. Negative for depression and suicidal ideas. The patient is not nervous/anxious.   All other systems reviewed and are negative.    VITALS:   Blood pressure 119/81, pulse 82, temperature 97.6 F (36.4 C), temperature source Oral, resp. rate 18, height 5\' 9"  (1.753 m), weight 110 lb (49.9 kg), SpO2 100%.  Wt Readings from Last 3 Encounters:  03/27/24 110 lb (49.9 kg)  02/21/24 111 lb 12.8 oz (50.7 kg)  01/24/24 116 lb (52.6 kg)    Body mass index is 16.24 kg/m.  Performance status (ECOG): 1 - Symptomatic but completely ambulatory  PHYSICAL EXAM:   Physical Exam Vitals and nursing note reviewed. Exam conducted with a chaperone present.  Constitutional:      Appearance: Normal appearance.  Cardiovascular:     Rate and Rhythm: Normal rate and regular rhythm.     Pulses: Normal pulses.     Heart sounds: Normal heart sounds.  Pulmonary:     Effort: Pulmonary effort is normal.     Breath sounds: Normal breath sounds.  Abdominal:     Palpations: Abdomen is soft. There is no hepatomegaly, splenomegaly or  mass.     Tenderness: There is no abdominal tenderness.  Musculoskeletal:     Right lower leg: No edema.     Left lower leg: No edema.  Lymphadenopathy:     Cervical: No cervical adenopathy.     Right cervical: No superficial, deep or posterior cervical adenopathy.    Left cervical: No superficial, deep or posterior cervical adenopathy.     Upper Body:     Right upper body: No supraclavicular or axillary adenopathy.     Left upper body: No supraclavicular or axillary adenopathy.  Neurological:     General: No focal deficit present.     Mental Status: He is alert and oriented to person, place, and time.  Psychiatric:        Mood and Affect: Mood normal.        Behavior: Behavior normal.     LABS:      Latest Ref Rng & Units 03/27/2024   11:49 AM 02/21/2024   11:23 AM 01/24/2024   12:01 PM  CBC  WBC 4.0 - 10.5 K/uL 7.0  6.8  6.8   Hemoglobin 13.0 - 17.0 g/dL 16.1  09.6  04.5   Hematocrit 39.0 - 52.0 % 40.6  38.9  39.0   Platelets 150 - 400 K/uL 409  376  352       Latest Ref Rng & Units 03/27/2024   11:49 AM 02/21/2024   11:23 AM 01/24/2024   12:01 PM  CMP  Glucose 70 - 99 mg/dL 95  409  99   BUN 8 - 23 mg/dL 12  8  11    Creatinine 0.61 - 1.24 mg/dL 8.11  9.14  7.82   Sodium 135 - 145 mmol/L 135  136  136   Potassium 3.5 - 5.1 mmol/L 3.8  3.4  4.1   Chloride 98 - 111 mmol/L 97  100  99   CO2 22 - 32 mmol/L 26  26  29    Calcium 8.9 - 10.3 mg/dL 9.0  8.7  9.0   Total Protein 6.5 - 8.1 g/dL 7.9  7.7  7.7   Total Bilirubin 0.0 - 1.2  mg/dL 0.5  0.6  0.6   Alkaline Phos 38 - 126 U/L 140  95  95   AST 15 - 41 U/L 20  17  16    ALT 0 - 44 U/L 38  17  20      No results found for: "CEA1", "CEA" / No results found for: "CEA1", "CEA" No results found for: "PSA1" No results found for: "ZOX096" No results found for: "CAN125"  No results found for: "TOTALPROTELP", "ALBUMINELP", "A1GS", "A2GS", "BETS", "BETA2SER", "GAMS", "MSPIKE", "SPEI" No results found for: "TIBC", "FERRITIN",  "IRONPCTSAT" Lab Results  Component Value Date   LDH 197 (H) 04/12/2023     STUDIES:   NM PET Image Restag (PS) Skull Base To Thigh Result Date: 03/26/2024 CLINICAL DATA:  Subsequent treatment strategy for metastatic disease evaluation. Lung carcinoma. Prior brain metastasis. EXAM: NUCLEAR MEDICINE PET SKULL BASE TO THIGH TECHNIQUE: 5.6 mCi F-18 FDG was injected intravenously. Full-ring PET imaging was performed from the skull base to thigh after the radiotracer. CT data was obtained and used for attenuation correction and anatomic localization. Fasting blood glucose: 105 mg/dl COMPARISON:  PET-CT scan 11/13/2023 FINDINGS: Mediastinal blood pool activity: SUV max 1.6 1.8 Liver activity: SUV max NA NECK: No hypermetabolic lymph nodes in the neck. Incidental CT findings: None. CHEST: Large layering RIGHT pleural effusion is unchanged. Within the anterior RIGHT lung/RIGHT middle lobe, there is a focus of increasing metabolic activity SUV max equal 6.6 on image 70 compared SUV max equal 3.3 on prior. There is peribronchial nodular thickening at this site measuring 1.7 x 2.1 cm which is similar in size to comparison exam. Small RIGHT hilar, subcarinal and RIGHT lower paratracheal nodes have mild radiotracer activity. For example subcarinal node SUV max equal 3.1 on image 57 compared SUV max equal 3.7 on prior. No interval change. LEFT lung is clear. No supraclavicular adenopathy. Incidental CT findings: None. ABDOMEN/PELVIS: No abnormal hypermetabolic activity within the liver, pancreas, adrenal glands, or spleen. No hypermetabolic lymph nodes in the abdomen or pelvis. Incidental CT findings: Benign periphery calcified lesion of the RIGHT kidney without metabolic activity. No interval change. Very little intra-abdominal fat. SKELETON: Metabolic activity associated with the medial head of the LEFT clavicle. There is a sclerotic lesion at this site on image 39. Metabolic activity is moderate with SUV max equal  4.6 compared SUV max equal 3.1 on prior. Sclerotic lesions within the RIGHT iliac bone do not have associated metabolic activity. Incidental CT findings: None. IMPRESSION: 1. Increasing metabolic activity associated with peribronchial nodular thickening in the anterior RIGHT lung/RIGHT middle lobe. Findings concerning for local lung recurrence. 2. Stable large layering RIGHT pleural effusion. 3. Stable mild metabolic activity associated with small RIGHT hilar and mediastinal lymph nodes. Indeterminate activity. Favor reactive. 4. Increasing metabolic activity associated with sclerotic lesion in the medial head of the LEFT clavicle. Findings concerning for recurrent osseous metastasis. Sclerotic lesions in the pelvis are not hypermetabolic consistent treated metastasis. 5. No evidence of metastatic disease in the abdomen pelvis. Electronically Signed   By: Deboraha Fallow M.D.   On: 03/26/2024 09:08

## 2024-03-27 NOTE — Patient Instructions (Addendum)
 [facility] at [facility] Discharge Instructions   You were seen and examined today by Dr. Cheree Cords.  He reviewed the results of your lab work which are normal/stable.  He reviewed the results of your PET scan was stable.   We will proceed with your Zometa infusion today.   Return as scheduled.    Thank you for choosing [facility] at [facility] to provide your oncology and hematology care.  To afford each patient quality time with our provider, please arrive at least 15 minutes before your scheduled appointment time.   If you have a lab appointment with the Cancer Center please come in thru the Main Entrance and check in at the main information desk.  You need to re-schedule your appointment should you arrive 10 or more minutes late.  We strive to give you quality time with our providers, and arriving late affects you and other patients whose appointments are after yours.  Also, if you no show three or more times for appointments you may be dismissed from the clinic at the providers discretion.     Again, thank you for choosing [facility].  Our hope is that these requests will decrease the amount of time that you wait before being seen by our physicians.       _____________________________________________________________  Should you have questions after your visit to [facility], please contact our office at [phone] and follow the prompts.  Our office hours are 8:00 a.m. and 4:30 p.m. Monday - Friday.  Please note that voicemails left after 4:00 p.m. may not be returned until the following business day.  We are closed weekends and major holidays.  You do have access to a nurse 24-7, just call the main number to the clinic [phone] and do not press any options, hold on the line and a nurse will answer the phone.    For prescription refill requests, have your pharmacy contact our office and allow 72 hours.     Due to Covid, you will need to wear a mask upon entering the hospital. If you do not have a mask, a mask will be given to you at the Main Entrance upon arrival. For doctor visits, patients may have 1 support person age 63 or older with them. For treatment visits, patients can not have anyone with them due to social distancing guidelines and our immunocompromised population.

## 2024-03-27 NOTE — Progress Notes (Signed)
 Patient is taking Gilotrif as prescribed. He has not missed any doses and reports no side effects at this time.

## 2024-03-27 NOTE — Patient Instructions (Signed)
 [facility] [place] - A DEPT OF [facility]  Discharge Instructions: Thank you for choosing [facility] to provide your oncology and hematology care.  If you have a lab appointment with the Cancer Center - please note that after April 8th, 2024, all labs will be drawn in the cancer center.  You do not have to check in or register with the main entrance as you have in the past but will complete your check-in in the cancer center.  Wear comfortable clothing and clothing appropriate for easy access to any Portacath or PICC line.   We strive to give you quality time with your provider. You may need to reschedule your appointment if you arrive late (15 or more minutes).  Arriving late affects you and other patients whose appointments are after yours.  Also, if you miss three or more appointments without notifying the office, you may be dismissed from the clinic at the provider's discretion.      For prescription refill requests, have your pharmacy contact our office and allow 72 hours for refills to be completed.    Today you received the following zometa infusion   To help prevent nausea and vomiting after your treatment, we encourage you to take your nausea medication as directed.  BELOW ARE SYMPTOMS THAT SHOULD BE REPORTED IMMEDIATELY: *FEVER GREATER THAN 100.4 F (38 C) OR HIGHER *CHILLS OR SWEATING *NAUSEA AND VOMITING THAT IS NOT CONTROLLED WITH YOUR NAUSEA MEDICATION *UNUSUAL SHORTNESS OF BREATH *UNUSUAL BRUISING OR BLEEDING *URINARY PROBLEMS (pain or burning when urinating, or frequent urination) *BOWEL PROBLEMS (unusual diarrhea, constipation, pain near the anus) TENDERNESS IN MOUTH AND THROAT WITH OR WITHOUT PRESENCE OF ULCERS (sore throat, sores in mouth, or a toothache) UNUSUAL RASH, SWELLING OR PAIN  UNUSUAL VAGINAL DISCHARGE OR ITCHING   Items with * indicate a potential emergency and should be followed up as soon as possible or go to the  Emergency Department if any problems should occur.  Please show the CHEMOTHERAPY ALERT CARD or IMMUNOTHERAPY ALERT CARD at check-in to the Emergency Department and triage nurse.  Should you have questions after your visit or need to cancel or reschedule your appointment, please contact [facility] [place] - A DEPT OF [facility] [phone]  and follow the prompts.  Office hours are 8:00 a.m. to 4:30 p.m. Monday - Friday. Please note that voicemails left after 4:00 p.m. may not be returned until the following business day.  We are closed weekends and major holidays. You have access to a nurse at all times for urgent questions. Please call the main number to the clinic [phone] and follow the prompts.  For any non-urgent questions, you may also contact your provider using MyChart. We now offer e-Visits for anyone 56 and older to request care online for non-urgent symptoms. For details visit [email or web address].   Also download the MyChart app! Go to the app store, search "MyChart", open the app, select [place], and log in with your MyChart username and password.

## 2024-03-27 NOTE — Progress Notes (Signed)
Zometa infusion given per orders. Patient tolerated it well without problems. Vitals stable and discharged home from clinic ambulatory. Follow up as scheduled.

## 2024-04-11 ENCOUNTER — Other Ambulatory Visit: Payer: Self-pay

## 2024-04-12 ENCOUNTER — Other Ambulatory Visit: Payer: Self-pay

## 2024-04-12 NOTE — Progress Notes (Signed)
 Specialty Pharmacy Refill Coordination Note  Theodore [name] is a 68 y.o. male contacted today regarding refills of specialty medication(s) Afatinib  Dimaleate (GILOTRIF )   Patient requested Delivery   Delivery date: 04/18/24   [address] [address]   Medication will be filled on 04/17/24.

## 2024-04-12 NOTE — Progress Notes (Signed)
 Specialty Pharmacy Ongoing Clinical Assessment Note  Theodore [name] is a 68 y.o. male who is being followed by the specialty pharmacy service for Rx[facility] Oncology   Patient's specialty medication(s) reviewed today: Afatinib  Dimaleate (GILOTRIF )   Missed doses in the last 4 weeks: 0   Patient/Caregiver did not have any additional questions or concerns.   Therapeutic benefit summary: Unable to assess   Adverse events/side effects summary: Experienced adverse events/side effects (continued diarrhea and dry skin on fingertips)   Patient's therapy is appropriate to: Continue    Goals Addressed             This Visit's Progress    Slow Disease Progression       Patient is on track. Patient will maintain adherence and adhere to provider and/or lab appointments. Per provider notes PET scan from 03/31/24 showed potential concerning changes in two areas under surveillance. Patient will have follow up appointment in 2 months and repeat scans in 3 months to continue to follow.         Follow up:  3 months  Shaniqua Guillot M Airen Stiehl Specialty Pharmacist

## 2024-04-21 ENCOUNTER — Other Ambulatory Visit: Payer: Self-pay | Admitting: Hematology

## 2024-04-22 ENCOUNTER — Encounter: Payer: Self-pay | Admitting: Hematology

## 2024-04-24 ENCOUNTER — Other Ambulatory Visit: Payer: Self-pay

## 2024-04-24 ENCOUNTER — Inpatient Hospital Stay

## 2024-04-24 DIAGNOSIS — C7951 Secondary malignant neoplasm of bone: Secondary | ICD-10-CM

## 2024-04-24 DIAGNOSIS — C3491 Malignant neoplasm of unspecified part of right bronchus or lung: Secondary | ICD-10-CM

## 2024-04-26 ENCOUNTER — Other Ambulatory Visit: Payer: Self-pay | Admitting: Hematology

## 2024-04-26 ENCOUNTER — Inpatient Hospital Stay

## 2024-04-26 ENCOUNTER — Inpatient Hospital Stay: Attending: Hematology

## 2024-04-26 VITALS — BP 123/68 | HR 74 | Temp 97.1°F | Resp 18 | Wt 100.6 lb

## 2024-04-26 DIAGNOSIS — R197 Diarrhea, unspecified: Secondary | ICD-10-CM | POA: Insufficient documentation

## 2024-04-26 DIAGNOSIS — C342 Malignant neoplasm of middle lobe, bronchus or lung: Secondary | ICD-10-CM | POA: Diagnosis present

## 2024-04-26 DIAGNOSIS — E876 Hypokalemia: Secondary | ICD-10-CM | POA: Insufficient documentation

## 2024-04-26 DIAGNOSIS — C7951 Secondary malignant neoplasm of bone: Secondary | ICD-10-CM

## 2024-04-26 DIAGNOSIS — Z800 Family history of malignant neoplasm of digestive organs: Secondary | ICD-10-CM | POA: Diagnosis not present

## 2024-04-26 DIAGNOSIS — Z923 Personal history of irradiation: Secondary | ICD-10-CM | POA: Diagnosis not present

## 2024-04-26 DIAGNOSIS — Z87891 Personal history of nicotine dependence: Secondary | ICD-10-CM | POA: Diagnosis not present

## 2024-04-26 DIAGNOSIS — C7931 Secondary malignant neoplasm of brain: Secondary | ICD-10-CM | POA: Insufficient documentation

## 2024-04-26 DIAGNOSIS — C3491 Malignant neoplasm of unspecified part of right bronchus or lung: Secondary | ICD-10-CM

## 2024-04-26 LAB — CBC WITH DIFFERENTIAL/PLATELET
Abs Immature Granulocytes: 0.02 10*3/uL (ref 0.00–0.07)
Basophils Absolute: 0 10*3/uL (ref 0.0–0.1)
Basophils Relative: 0 %
Eosinophils Absolute: 0 10*3/uL (ref 0.0–0.5)
Eosinophils Relative: 0 %
HCT: 39.3 % (ref 39.0–52.0)
Hemoglobin: 13.3 g/dL (ref 13.0–17.0)
Immature Granulocytes: 0 %
Lymphocytes Relative: 22 %
Lymphs Abs: 1.4 10*3/uL (ref 0.7–4.0)
MCH: 27.7 pg (ref 26.0–34.0)
MCHC: 33.8 g/dL (ref 30.0–36.0)
MCV: 81.9 fL (ref 80.0–100.0)
Monocytes Absolute: 0.8 10*3/uL (ref 0.1–1.0)
Monocytes Relative: 13 %
Neutro Abs: 4.1 10*3/uL (ref 1.7–7.7)
Neutrophils Relative %: 65 %
Platelets: 469 10*3/uL — ABNORMAL HIGH (ref 150–400)
RBC: 4.8 MIL/uL (ref 4.22–5.81)
RDW: 14.8 % (ref 11.5–15.5)
WBC: 6.4 10*3/uL (ref 4.0–10.5)
nRBC: 0 % (ref 0.0–0.2)

## 2024-04-26 LAB — COMPREHENSIVE METABOLIC PANEL WITH GFR
ALT: 20 U/L (ref 0–44)
AST: 17 U/L (ref 15–41)
Albumin: 3.4 g/dL — ABNORMAL LOW (ref 3.5–5.0)
Alkaline Phosphatase: 146 U/L — ABNORMAL HIGH (ref 38–126)
Anion gap: 13 (ref 5–15)
BUN: 29 mg/dL — ABNORMAL HIGH (ref 8–23)
CO2: 23 mmol/L (ref 22–32)
Calcium: 8.9 mg/dL (ref 8.9–10.3)
Chloride: 94 mmol/L — ABNORMAL LOW (ref 98–111)
Creatinine, Ser: 1.08 mg/dL (ref 0.61–1.24)
GFR, Estimated: >60 mL/min (ref >60)
Glucose, Bld: 117 mg/dL — ABNORMAL HIGH (ref 70–99)
Potassium: 3.8 mmol/L (ref 3.5–5.1)
Sodium: 130 mmol/L — ABNORMAL LOW (ref 135–145)
Total Bilirubin: 0.9 mg/dL (ref 0.0–1.2)
Total Protein: 8.5 g/dL — ABNORMAL HIGH (ref 6.5–8.1)

## 2024-04-26 LAB — MAGNESIUM: Magnesium: 2.3 mg/dL (ref 1.7–2.4)

## 2024-04-26 MED ORDER — ZOLEDRONIC ACID 4 MG/100ML IV SOLN
4.0000 mg | Freq: Once | INTRAVENOUS | Status: AC
  Administered 2024-04-26: 4 mg via INTRAVENOUS
  Filled 2024-04-26: qty 100

## 2024-04-26 MED ORDER — SODIUM CHLORIDE 0.9 % IV SOLN: INTRAVENOUS | Status: DC

## 2024-04-26 MED ORDER — SODIUM CHLORIDE 0.9 % IV SOLN: Freq: Once | INTRAVENOUS | Status: AC

## 2024-04-26 NOTE — Patient Instructions (Addendum)
 [facility] [place] - A DEPT OF [facility]  Discharge Instructions: Thank you for choosing [facility] to provide your oncology and hematology care.  If you have a lab appointment with the Cancer Center - please note that after April 8th, 2024, all labs will be drawn in the cancer center.  You do not have to check in or register with the main entrance as you have in the past but will complete your check-in in the cancer center.  Wear comfortable clothing and clothing appropriate for easy access to any Portacath or PICC line.   We strive to give you quality time with your provider. You may need to reschedule your appointment if you arrive late (15 or more minutes).  Arriving late affects you and other patients whose appointments are after yours.  Also, if you miss three or more appointments without notifying the office, you may be dismissed from the clinic at the provider's discretion.      For prescription refill requests, have your pharmacy contact our office and allow 72 hours for refills to be completed.    Today you received the following Zometa  and 1 L of normal saline. Per Dr. Cheree Cords STOP taking afatinib , follow up with Dr. Katragadda next week. Follow diarrhea instruction sheet provided.    To help prevent nausea and vomiting after your treatment, we encourage you to take your nausea medication as directed.  BELOW ARE SYMPTOMS THAT SHOULD BE REPORTED IMMEDIATELY: *FEVER GREATER THAN 100.4 F (38 C) OR HIGHER *CHILLS OR SWEATING *NAUSEA AND VOMITING THAT IS NOT CONTROLLED WITH YOUR NAUSEA MEDICATION *UNUSUAL SHORTNESS OF BREATH *UNUSUAL BRUISING OR BLEEDING *URINARY PROBLEMS (pain or burning when urinating, or frequent urination) *BOWEL PROBLEMS (unusual diarrhea, constipation, pain near the anus) TENDERNESS IN MOUTH AND THROAT WITH OR WITHOUT PRESENCE OF ULCERS (sore throat, sores in mouth, or a toothache) UNUSUAL RASH, SWELLING OR PAIN   UNUSUAL VAGINAL DISCHARGE OR ITCHING   Items with * indicate a potential emergency and should be followed up as soon as possible or go to the Emergency Department if any problems should occur.  Please show the CHEMOTHERAPY ALERT CARD or IMMUNOTHERAPY ALERT CARD at check-in to the Emergency Department and triage nurse.  Should you have questions after your visit or need to cancel or reschedule your appointment, please contact [facility] [place] - A DEPT OF [facility] [phone]  and follow the prompts.  Office hours are 8:00 a.m. to 4:30 p.m. Monday - Friday. Please note that voicemails left after 4:00 p.m. may not be returned until the following business day.  We are closed weekends and major holidays. You have access to a nurse at all times for urgent questions. Please call the main number to the clinic [phone] and follow the prompts.  For any non-urgent questions, you may also contact your provider using MyChart. We now offer e-Visits for anyone 96 and older to request care online for non-urgent symptoms. For details visit [email or web address].   Also download the MyChart app! Go to the app store, search "MyChart", open the app, select [place], and log in with your MyChart username and password.

## 2024-04-26 NOTE — Progress Notes (Signed)
 Patient presents today zometa . Patient complains of worsening diarrhea this week despite taking imodium and lomotil . Patient has lost 10lbs in the last month and states he has been able to eat some. Sodium 130. Dr. Cheree Cords made aware of symptoms received orders for 1L of Normal Saline and instruct patient to stop taking afatinib  and have patient follow up at clinic next week. GI and Cdiff panel ordered by Dr. Cheree Cords.  Patient tolerated therapy with no complaints voiced. Labs reviewed. Side effects with management reviewed with understanding verbalized. Peripheral IV site clean and dry with no bruising or swelling noted at site. Good blood return noted before and after administration of therapy. Band aid applied.  Stool kit sent home to collect stool from patient. Patient left in satisfactory condition with VSS and no s/s of distress noted.

## 2024-04-29 ENCOUNTER — Other Ambulatory Visit: Payer: Self-pay | Admitting: Hematology

## 2024-04-29 ENCOUNTER — Other Ambulatory Visit: Payer: Self-pay

## 2024-04-29 ENCOUNTER — Other Ambulatory Visit: Payer: Self-pay | Admitting: *Deleted

## 2024-04-29 DIAGNOSIS — R197 Diarrhea, unspecified: Secondary | ICD-10-CM

## 2024-04-29 DIAGNOSIS — C7951 Secondary malignant neoplasm of bone: Secondary | ICD-10-CM | POA: Diagnosis not present

## 2024-04-29 LAB — C DIFFICILE QUICK SCREEN W PCR REFLEX
C Diff antigen: NEGATIVE
C Diff interpretation: NOT DETECTED
C Diff toxin: NEGATIVE

## 2024-04-30 LAB — GASTROINTESTINAL PANEL BY PCR, STOOL (REPLACES STOOL CULTURE)

## 2024-05-01 ENCOUNTER — Inpatient Hospital Stay: Admitting: Hematology

## 2024-05-01 ENCOUNTER — Inpatient Hospital Stay

## 2024-05-01 VITALS — BP 111/81 | HR 100 | Temp 96.7°F | Wt 100.3 lb

## 2024-05-01 DIAGNOSIS — C3491 Malignant neoplasm of unspecified part of right bronchus or lung: Secondary | ICD-10-CM

## 2024-05-01 DIAGNOSIS — C7951 Secondary malignant neoplasm of bone: Secondary | ICD-10-CM | POA: Diagnosis not present

## 2024-05-01 MED ORDER — MAGNESIUM SULFATE 2 GM/50ML IV SOLN
2.0000 g | Freq: Once | INTRAVENOUS | Status: AC
  Administered 2024-05-01: 2 g via INTRAVENOUS
  Filled 2024-05-01: qty 50

## 2024-05-01 MED ORDER — HEPARIN SOD (PORK) LOCK FLUSH 100 UNIT/ML IV SOLN: 500.0000 [IU] | Freq: Once | INTRAVENOUS | Status: DC

## 2024-05-01 MED ORDER — SODIUM CHLORIDE 0.9% FLUSH: 10.0000 mL | Freq: Once | INTRAVENOUS | Status: DC

## 2024-05-01 MED ORDER — POTASSIUM CHLORIDE IN NACL 20-0.9 MEQ/L-% IV SOLN
Freq: Once | INTRAVENOUS | Status: AC
  Filled 2024-05-01: qty 1000

## 2024-05-01 NOTE — Progress Notes (Signed)
 [facility] [address], [place] [address]    Clinic Day:  05/01/2024  Referring physician: Omie Bickers, MD  Patient Care Team: Omie Bickers, MD as PCP - General (Internal Medicine)   ASSESSMENT & PLAN:   Assessment: 1.  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 Hillary Lowing with a chest x-ray on 03/15/2023, with right pleural effusion. - CT chest (03/17/2023): Right middle lobe mass with complete collapse of the right middle lobe, moderate right pleural effusion, nodularity of the right minor fissure, lytic lesions of the right scapula and left third and fourth ribs.  Multiple enhancing liver lesions concerning for metastatic disease. - PET scan (04/06/2023): Right middle lobe primary bronchogenic carcinoma with mediastinal nodal, bone metastasis and probable upper abdominal nodal metastasis.  Right sided pneumothorax.  Metastasis with right acetabulum and proximal right femur.  Bilateral adrenal hypermetabolism with mild nodularity suspicious for early metastasis.  Right renal mass is not hypermetabolic.  No hepatic hypermetabolism. - Brain MRI (03/30/2023): 1.5 x 1.7 cm lesion in the left frontal lobe.  1.5 cm lesion in the high left frontal lobe.  5 mm lesion in the right frontal lobe.  Few other subcentimeter lesions, total of 10 reported. - Right pleural fluid cytology (04/11/2023): Malignant cells consistent with adenocarcinoma. - RML lung FNA (04/18/2023): Lung adenocarcinoma. - Guardant360: EGFR G719C, EGFR S768I.  MSI-high not detected. - [id number] tissue next (04/26/2023): PD-L1 22 C3 TPS<1%.  EGFR G719C, EGFR S768I, T p53, EGFR amplification, PIK3CA amplification, BRAF amplification, RB1 splice site SNV.  MSI-high not detected. - Afatinib  30 mg daily started on 05/18/2023, dose increased to 40 mg daily on 07/06/2023   2.  Social/family history: - He  lives by himself.  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  He had exposure to chemicals.  He quit smoking in 2012.  Smoked 1 pack/day starting age 67. - Maternal grandmother and maternal uncle had colon cancers.    Plan: 1.  Metastatic adenocarcinoma of the lung to the brain, bones and adrenals: - PET scan (03/31/2024): Mildly increased metabolic activity in the left medial clavicle head.  Increasing metabolic activity associated with peribronchial nodular thickening in the anterior right lung/middle lobe.  Stable right hilar and mediastinal adenopathy.  Both areas are not new and have mildly increased uptake.  Hence we have recommended to continue same therapy and repeat scan in 3 months. - Afatinib  40 mg daily was held on 04/26/2024 due to diarrhea. - Diarrhea is improving.  I have recommended that he start back on afatinib  once his bowels are back to baseline.  I have reviewed labs from 04/26/2024.  Today we will give him 1 L of fluid with electrolytes.  He has lost 10 pounds due to diarrhea.  He is drinking 1 can of Ensure per day.  I will see him back in 3 weeks for follow-up with repeat labs.    2.  Brain metastasis: - MRI brain on 01/09/2024: Decreased size of the residual enhancement in the 2 previously treated metastasis.  He is scheduled for another MRI soon.   3.  Diarrhea: - Previously he had mild diarrhea for which she was taking Lomotil  as needed.  For the last 1 week the diarrhea has gotten worse, 5-7 times a day, watery.  He reports that diarrhea started after eating at [facility] and [employer].  He was given fluids with electrolytes on 04/26/2024.  We have sent stool panel on 04/29/2024.  C. difficile was negative.  GI panel is pending.  He is currently taking Imodium alternating with Lomotil .  Yesterday he had 4 had to go to the bathroom 4 times, mostly gas than loose stools.  Also reported  right upper quadrant pain on and off.  Will closely monitor. - Will follow-up on the GI panel.  He will receive some IV fluids today.   4.  Bone metastasis: - Calcium is 8.9.  He does not have any jaw pains or dental issues.  Continue Zometa  monthly.   5.  Hypokalemia: - Continue K-Dur 20 mill equivalents daily.  Potassium is normal.    No orders of the defined types were placed in this encounter.     Nadeen Augusta Teague,acting as a Neurosurgeon for [name], MD.,have documented all relevant documentation on the behalf of [name], MD,as directed by  [name], MD while in the presence of [name], MD.  I, [name] MD, have reviewed the above documentation for accuracy and completeness, and I agree with the above.      [name], MD   5/21/20259:28 AM  CHIEF COMPLAINT:   Diagnosis: metastatic non-small cell adenocarcinoma    Cancer Staging  Lung cancer [facility]) Staging form: Lung, AJCC 8th Edition - Clinical stage from 04/24/2023: Stage IVB (cT1c, cN2, pM1c) - Signed by [name], MD on 05/10/2023    Prior Therapy: palliative XRT to right scapula and right pelvis, 05/04/23 - 05/18/23   Current Therapy:  afatinib     HISTORY OF PRESENT ILLNESS:   Oncology History  Lung cancer (HCC)  04/10/2023 Initial Diagnosis   Lung cancer (HCC)   04/24/2023 Cancer Staging   Staging form: Lung, AJCC 8th Edition - Clinical stage from 04/24/2023: Stage IVB (cT1c, cN2, pM1c) - Signed by [name], MD on 05/10/2023 Histopathologic type: Adenocarcinoma, NOS Stage prefix: Initial diagnosis      INTERVAL HISTORY:   Theodore [name] is a 68 y.o. male presenting to clinic today for follow up of metastatic non-small cell adenocarcinoma. He was last seen by me on 03/27/24.  Today, he states that he is doing well overall. His appetite level is at 50%. His energy level is at 40%.  PAST MEDICAL HISTORY:   Past Medical  History: Past Medical History:  Diagnosis Date   COPD (chronic obstructive pulmonary disease) (HCC)     Surgical History: Past Surgical History:  Procedure Laterality Date   BRONCHIAL BIOPSY  04/18/2023   Procedure: BRONCHIAL BIOPSIES;  Surgeon: Prudy Brownie, DO;  Location: [facility] ENDOSCOPY;  Service: Pulmonary;;   BRONCHIAL NEEDLE ASPIRATION BIOPSY  04/18/2023   Procedure: BRONCHIAL NEEDLE ASPIRATION BIOPSIES;  Surgeon: Prudy Brownie, DO;  Location: [facility] ENDOSCOPY;  Service: Pulmonary;;   CHEST TUBE INSERTION Right 04/11/2023   Procedure: CHEST TUBE INSERTION;  Surgeon: Gloriajean Large, MD;  Location: [facility] ENDOSCOPY;  Service: Pulmonary;  Laterality: Right;  fentanyl  IV only   excision of cyst left ankle     MASS EXCISION Left 08/27/2018   Procedure: EXCISION LIPOMA LEFT BUTTOCK;  Surgeon: Alanda Allegra, MD;  Location: [facility] ORS;  Service: General;  Laterality: Left;    Social History: Social History   Socioeconomic History   Marital status: Single    Spouse name: Not on file   Number of children: Not on file  Years of education: Not on file   Highest education level: Not on file  Occupational History   Not on file  Tobacco Use   Smoking status: Former    Current packs/day: 0.00    Types: Cigarettes    Quit date: 2012    Years since quitting: 13.3   Smokeless tobacco: Never  Vaping Use   Vaping status: Never Used  Substance and Sexual Activity   Alcohol use: Not Currently    Comment: Quit 1988   Drug use: Not Currently   Sexual activity: Yes    Birth control/protection: None  Other Topics Concern   Not on file  Social History Narrative   Not on file   Social Drivers of Health   Financial Resource Strain: Not on file  Food Insecurity: Food Insecurity Present (05/02/2023)   Hunger Vital Sign    Worried About Running Out of Food in the Last Year: Never true    Ran Out of Food in the Last Year: Sometimes true  Transportation Needs: No Transportation Needs (05/02/2023)    PRAPARE - Administrator, Civil Service (Medical): No    Lack of Transportation (Non-Medical): No  Physical Activity: Not on file  Stress: Not on file  Social Connections: Not on file  Intimate Partner Violence: Not At Risk (05/02/2023)   Humiliation, Afraid, Rape, and Kick questionnaire    Fear of Current or Ex-Partner: No    Emotionally Abused: No    Physically Abused: No    Sexually Abused: No    Family History: Family History  Problem Relation Age of Onset   Colon cancer Maternal Grandmother    Colon cancer Maternal Uncle     Current Medications:  Current Outpatient Medications:    acetaminophen  (TYLENOL ) 325 MG tablet, Take 2 tablets (650 mg total) by mouth every 6 (six) hours as needed for mild pain, moderate pain, headache or fever (or Fever >/= 101)., Disp: , Rfl:    albuterol  (PROVENTIL  HFA;VENTOLIN  HFA) 108 (90 Base) MCG/ACT inhaler, Inhale 2 puffs into the lungs every 4 (four) hours as needed for wheezing or shortness of breath., Disp: , Rfl:    BREZTRI AEROSPHERE 160-9-4.8 MCG/ACT AERO, SMARTSIG:2 Puff(s) By Mouth Twice Daily, Disp: , Rfl:    clindamycin  (CLINDAGEL) 1 % gel, APPLY TOPICALLY TO THE AFFECTED AREA TWICE DAILY, Disp: 30 g, Rfl: 0   diphenoxylate -atropine  (LOMOTIL ) 2.5-0.025 MG tablet, Take 1 tablet by mouth 4 (four) times daily as needed for diarrhea or loose stools., Disp: 120 tablet, Rfl: 3   folic acid  (FOLVITE ) 1 MG tablet, TAKE 1 TABLET BY MOUTH EVERY DAY, Disp: 90 tablet, Rfl: 3   HYDROCORTISONE , TOPICAL, 2.5 % SOLN, Apply topically twice daily., Disp: 30 mL, Rfl: 0   ibuprofen (ADVIL) 200 MG tablet, Take 400 mg by mouth every 6 (six) hours as needed for moderate pain., Disp: , Rfl:    LORazepam  (ATIVAN ) 0.5 MG tablet, 1 tab po 30 minutes prior to radiation or MRI scans, Disp: 30 tablet, Rfl: 0   potassium chloride  SA (KLOR-CON  M) 20 MEQ tablet, Take 1 tablet (20 mEq total) by mouth daily., Disp: 90 tablet, Rfl: 3   afatinib  dimaleate  (GILOTRIF ) 40 MG tablet, Take 1 tablet (40 mg total) by mouth daily. Take on an empty stomach 1hr before or 2hrs after meals. (Patient not taking: Reported on 05/01/2024), Disp: 30 tablet, Rfl: 3   Allergies: No Active Allergies  REVIEW OF SYSTEMS:   Review of Systems  Constitutional:  Negative for chills, fatigue and fever.  HENT:   Negative for lump/mass, mouth sores, nosebleeds, sore throat and trouble swallowing.   Eyes:  Negative for eye problems.  Respiratory:  Negative for cough and shortness of breath.   Cardiovascular:  Negative for chest pain, leg swelling and palpitations.  Gastrointestinal:  Positive for diarrhea and nausea. Negative for abdominal pain, constipation and vomiting.  Genitourinary:  Negative for bladder incontinence, difficulty urinating, dysuria, frequency, hematuria and nocturia.   Musculoskeletal:  Negative for arthralgias, back pain, flank pain, myalgias and neck pain.  Skin:  Negative for itching and rash.  Neurological:  Negative for dizziness, headaches and numbness.  Hematological:  Does not bruise/bleed easily.  Psychiatric/Behavioral:  Positive for sleep disturbance. Negative for depression and suicidal ideas. The patient is not nervous/anxious.   All other systems reviewed and are negative.    VITALS:   Blood pressure 111/81, pulse 100, temperature (!) 96.7 F (35.9 C), temperature source Tympanic, weight 100 lb 5 oz (45.5 kg), SpO2 100%.  Wt Readings from Last 3 Encounters:  05/01/24 100 lb 5 oz (45.5 kg)  04/26/24 100 lb 9.6 oz (45.6 kg)  03/27/24 110 lb (49.9 kg)    Body mass index is 14.81 kg/m.  Performance status (ECOG): 1 - Symptomatic but completely ambulatory  PHYSICAL EXAM:   Physical Exam Vitals and nursing note reviewed. Exam conducted with a chaperone present.  Constitutional:      Appearance: Normal appearance.  Cardiovascular:     Rate and Rhythm: Normal rate and regular rhythm.     Pulses: Normal pulses.     Heart  sounds: Normal heart sounds.  Pulmonary:     Effort: Pulmonary effort is normal.     Breath sounds: Normal breath sounds.  Abdominal:     Palpations: Abdomen is soft. There is no hepatomegaly, splenomegaly or mass.     Tenderness: There is no abdominal tenderness.  Musculoskeletal:     Right lower leg: No edema.     Left lower leg: No edema.  Lymphadenopathy:     Cervical: No cervical adenopathy.     Right cervical: No superficial, deep or posterior cervical adenopathy.    Left cervical: No superficial, deep or posterior cervical adenopathy.     Upper Body:     Right upper body: No supraclavicular or axillary adenopathy.     Left upper body: No supraclavicular or axillary adenopathy.  Neurological:     General: No focal deficit present.     Mental Status: He is alert and oriented to person, place, and time.  Psychiatric:        Mood and Affect: Mood normal.        Behavior: Behavior normal.     LABS:      Latest Ref Rng & Units 04/26/2024    8:37 AM 03/27/2024   11:49 AM 02/21/2024   11:23 AM  CBC  WBC 4.0 - 10.5 K/uL 6.4  7.0  6.8   Hemoglobin 13.0 - 17.0 g/dL 16.1  09.6  04.5   Hematocrit 39.0 - 52.0 % 39.3  40.6  38.9   Platelets 150 - 400 K/uL 469  409  376       Latest Ref Rng & Units 04/26/2024    8:37 AM 03/27/2024   11:49 AM 02/21/2024   11:23 AM  CMP  Glucose 70 - 99 mg/dL 409  95  811   BUN 8 - 23 mg/dL 29  12  8    Creatinine  0.61 - 1.24 mg/dL 9.60  4.54  0.98   Sodium 135 - 145 mmol/L 130  135  136   Potassium 3.5 - 5.1 mmol/L 3.8  3.8  3.4   Chloride 98 - 111 mmol/L 94  97  100   CO2 22 - 32 mmol/L 23  26  26    Calcium 8.9 - 10.3 mg/dL 8.9  9.0  8.7   Total Protein 6.5 - 8.1 g/dL 8.5  7.9  7.7   Total Bilirubin 0.0 - 1.2 mg/dL 0.9  0.5  0.6   Alkaline Phos 38 - 126 U/L 146  140  95   AST 15 - 41 U/L 17  20  17    ALT 0 - 44 U/L 20  38  17      No results found for: "CEA1", "CEA" / No results found for: "CEA1", "CEA" No results found for: "PSA1" No  results found for: "JXB147" No results found for: "CAN125"  No results found for: "TOTALPROTELP", "ALBUMINELP", "A1GS", "A2GS", "BETS", "BETA2SER", "GAMS", "MSPIKE", "SPEI" No results found for: "TIBC", "FERRITIN", "IRONPCTSAT" Lab Results  Component Value Date   LDH 197 (H) 04/12/2023     STUDIES:   No results found.

## 2024-05-01 NOTE — Patient Instructions (Addendum)
 [facility] at [facility] Discharge Instructions   You were seen and examined today by Dr. Cheree Cords.  He wants you to continue to hold the Gilotrif  you are taking until your bowels get back to normal.   We will give you IV fluids today.   We will see you back in 3 weeks.   Return as scheduled.    Thank you for choosing [facility] at [facility] to provide your oncology and hematology care.  To afford each patient quality time with our provider, please arrive at least 15 minutes before your scheduled appointment time.   If you have a lab appointment with the Cancer Center please come in thru the Main Entrance and check in at the main information desk.  You need to re-schedule your appointment should you arrive 10 or more minutes late.  We strive to give you quality time with our providers, and arriving late affects you and other patients whose appointments are after yours.  Also, if you no show three or more times for appointments you may be dismissed from the clinic at the providers discretion.     Again, thank you for choosing [facility].  Our hope is that these requests will decrease the amount of time that you wait before being seen by our physicians.       _____________________________________________________________  Should you have questions after your visit to [facility], please contact our office at [phone] and follow the prompts.  Our office hours are 8:00 a.m. and 4:30 p.m. Monday - Friday.  Please note that voicemails left after 4:00 p.m. may not be returned until the following business day.  We are closed weekends and major holidays.  You do have access to a nurse 24-7, just call the main number to the clinic [phone] and do not press any options, hold on the line and a nurse will answer the phone.    For prescription refill requests, have your pharmacy contact our office and allow 72 hours.     Due to Covid, you will need to wear a mask upon entering the hospital. If you do not have a mask, a mask will be given to you at the Main Entrance upon arrival. For doctor visits, patients may have 1 support person age 24 or older with them. For treatment visits, patients can not have anyone with them due to social distancing guidelines and our immunocompromised population.

## 2024-05-01 NOTE — Patient Instructions (Signed)
 [facility] [place] - A DEPT OF [place]  Discharge Instructions: Thank you for choosing [facility] to provide your oncology and hematology care.  If you have a lab appointment with the Cancer Center - please note that after April 8th, 2024, all labs will be drawn in the cancer center.  You do not have to check in or register with the main entrance as you have in the past but will complete your check-in in the cancer center.  Wear comfortable clothing and clothing appropriate for easy access to any Portacath or PICC line.   We strive to give you quality time with your provider. You may need to reschedule your appointment if you arrive late (15 or more minutes).  Arriving late affects you and other patients whose appointments are after yours.  Also, if you miss three or more appointments without notifying the office, you may be dismissed from the clinic at the provider's discretion.      For prescription refill requests, have your pharmacy contact our office and allow 72 hours for refills to be completed.    Today you received the following House fluids, return as scheduled.   To help prevent nausea and vomiting after your treatment, we encourage you to take your nausea medication as directed.  BELOW ARE SYMPTOMS THAT SHOULD BE REPORTED IMMEDIATELY: *FEVER GREATER THAN 100.4 F (38 C) OR HIGHER *CHILLS OR SWEATING *NAUSEA AND VOMITING THAT IS NOT CONTROLLED WITH YOUR NAUSEA MEDICATION *UNUSUAL SHORTNESS OF BREATH *UNUSUAL BRUISING OR BLEEDING *URINARY PROBLEMS (pain or burning when urinating, or frequent urination) *BOWEL PROBLEMS (unusual diarrhea, constipation, pain near the anus) TENDERNESS IN MOUTH AND THROAT WITH OR WITHOUT PRESENCE OF ULCERS (sore throat, sores in mouth, or a toothache) UNUSUAL RASH, SWELLING OR PAIN  UNUSUAL VAGINAL DISCHARGE OR ITCHING   Items with * indicate a potential emergency and should be followed up as soon as  possible or go to the Emergency Department if any problems should occur.  Please show the CHEMOTHERAPY ALERT CARD or IMMUNOTHERAPY ALERT CARD at check-in to the Emergency Department and triage nurse.  Should you have questions after your visit or need to cancel or reschedule your appointment, please contact [facility] [place] - A DEPT OF [facility] [phone]  and follow the prompts.  Office hours are 8:00 a.m. to 4:30 p.m. Monday - Friday. Please note that voicemails left after 4:00 p.m. may not be returned until the following business day.  We are closed weekends and major holidays. You have access to a nurse at all times for urgent questions. Please call the main number to the clinic [phone] and follow the prompts.  For any non-urgent questions, you may also contact your provider using MyChart. We now offer e-Visits for anyone 69 and older to request care online for non-urgent symptoms. For details visit [email or web address].   Also download the MyChart app! Go to the app store, search "MyChart", open the app, select [place], and log in with your MyChart username and password.

## 2024-05-01 NOTE — Progress Notes (Signed)
 Patient presents today for House fluids per Dr. Cheree Cords. Patient tolerated House fluids with no complaints voiced. Peripheral IV site clean and dry with good blood return noted before and after infusion. Band aid applied. VSS with discharge and left in satisfactory condition with no s/s of distress noted.

## 2024-05-16 ENCOUNTER — Ambulatory Visit
Admission: RE | Admit: 2024-05-16 | Discharge: 2024-05-16 | Disposition: A | Discharge status: Home / Self Care | Payer: Medicare Other | Source: Ambulatory Visit | Attending: Radiation Oncology | Admitting: Radiation Oncology

## 2024-05-16 DIAGNOSIS — C3491 Malignant neoplasm of unspecified part of right bronchus or lung: Secondary | ICD-10-CM

## 2024-05-16 DIAGNOSIS — C7931 Secondary malignant neoplasm of brain: Secondary | ICD-10-CM

## 2024-05-16 MED ORDER — GADOPICLENOL 0.5 MMOL/ML IV SOLN
5.0000 mL | Freq: Once | INTRAVENOUS | Status: AC | PRN
  Administered 2024-05-16: 5 mL via INTRAVENOUS

## 2024-05-17 ENCOUNTER — Other Ambulatory Visit: Payer: Self-pay

## 2024-05-20 ENCOUNTER — Telehealth: Payer: Self-pay | Admitting: Dietician

## 2024-05-20 ENCOUNTER — Inpatient Hospital Stay: Attending: Hematology | Admitting: Dietician

## 2024-05-20 ENCOUNTER — Other Ambulatory Visit: Payer: Self-pay

## 2024-05-20 DIAGNOSIS — Z79899 Other long term (current) drug therapy: Secondary | ICD-10-CM | POA: Insufficient documentation

## 2024-05-20 DIAGNOSIS — C7970 Secondary malignant neoplasm of unspecified adrenal gland: Secondary | ICD-10-CM | POA: Insufficient documentation

## 2024-05-20 DIAGNOSIS — C342 Malignant neoplasm of middle lobe, bronchus or lung: Secondary | ICD-10-CM | POA: Insufficient documentation

## 2024-05-20 DIAGNOSIS — E876 Hypokalemia: Secondary | ICD-10-CM | POA: Insufficient documentation

## 2024-05-20 DIAGNOSIS — C787 Secondary malignant neoplasm of liver and intrahepatic bile duct: Secondary | ICD-10-CM | POA: Insufficient documentation

## 2024-05-20 DIAGNOSIS — C7931 Secondary malignant neoplasm of brain: Secondary | ICD-10-CM | POA: Insufficient documentation

## 2024-05-20 DIAGNOSIS — C7951 Secondary malignant neoplasm of bone: Secondary | ICD-10-CM | POA: Insufficient documentation

## 2024-05-20 DIAGNOSIS — Z87891 Personal history of nicotine dependence: Secondary | ICD-10-CM | POA: Insufficient documentation

## 2024-05-20 NOTE — Telephone Encounter (Signed)
 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 S/p palliative XRT. Patient receiving Afatinib  40 mg daily (held 5/16 d/t diarrhea). Patient is under the care of Dr. Cheree Cords.   Past medical history includes severe PCM, COPD, insomnia, pleural effusion, hydropneumothorax  Spoke with patient via telephone. He is doing alright this morning. Reports diarrhea has cleared up and appetite is improving. Patient reports usually having loose stools with oral chemo. Pt says he is unsure if he ate something bad that caused diarrhea given he ate [employer] the night before episodes started. Patient endorses 5-7 episodes daily that lasted ~one week.   Patient reports usually eating 3 smaller meals. He enjoys home cooking. Daughter brings him meals often. He enjoys snacking on fruit, but this upsets his stomach at times. Pt loves banana's but they hurt his stomach. He tolerates Ensure well. Currently drinking one daily as this is all he can afford. Patient reports financial hardship, specifically with paying "all these co-pays." Pt requesting to speak with LCSW to discuss medicaid eligibility.    Nutrition Focused Physical Exam: unable to complete (telephone visit)   Medications: lomotil , folic acid , ativan , klor-con    Labs: 5/16 - Na 130, glucose 117, BUN 29, albumin 3.4   Anthropometrics:   Height: 5'9" Weight: 100 lb 5 oz (5/21) UBW: 124 lb (May 2024) BMI: 14.81 (underwt)   Estimated Energy Needs  Kcals: 1800-2000 Protein: 64-82 Fluid: >1.4 L   NUTRITION DIAGNOSIS: Unintended wt loss related to cancer and side effects of therapy as evidenced by diarrhea, 9% wt loss in 5 weeks which is severe for time frame (per chart, pt 110 lb on 4/16)   MALNUTRITION DIAGNOSIS: Pt is severely underwt for BMI. He meets criteria for severe malnutrition in the context of  chronic disease (metastatic lung cancer) as evidenced by intake meeting </= 75% of estimated needs, 9% wt loss in 5 weeks, 19% wt loss in 12 months   INTERVENTION:  Educated on strategies for diarrhea, foods best tolerated, foods to limit/avoid  Discussed small frequent meals/snacks - encourage high calorie high protein foods Educated on sources of protein - recommend protein foods at every meal Continue Ensure Plus/equivalent - recommend 2/day as able In-basket to LCSW  Will provide handouts, samples of banatrol, ensure + coupons - pt to pick this up at 6/11 [facility] visit   MONITORING, EVALUATION, GOAL: Patient will tolerate increased calories and protein to promote wt gain   Next Visit: Monday July 7 via telephone

## 2024-05-20 NOTE — Progress Notes (Signed)
 Specialty Pharmacy Refill Coordination Note  Theodore [name] is a 68 y.o. male contacted today regarding refills of specialty medication(s) Afatinib  Dimaleate (GILOTRIF )   Patient requested Delivery   Delivery date: 05/22/24   [address] [place] [address] [address]   Medication will be filled on 05/21/24.

## 2024-05-21 ENCOUNTER — Other Ambulatory Visit: Payer: Self-pay

## 2024-05-21 DIAGNOSIS — C3491 Malignant neoplasm of unspecified part of right bronchus or lung: Secondary | ICD-10-CM

## 2024-05-21 NOTE — Progress Notes (Signed)
 Theodore [name] [address], [place] [address]    [name] Day:  05/22/2024  Referring physician: Omie Bickers, MD  Patient Care Team: Theodore Bickers, MD as PCP - General (Internal Medicine)   ASSESSMENT & PLAN:   Assessment: 1.  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 Hillary [name] with a chest x-ray on 03/15/2023, with right pleural effusion. - CT chest (03/17/2023): Right middle lobe mass with complete collapse of the right middle lobe, moderate right pleural effusion, nodularity of the right minor fissure, lytic lesions of the right scapula and left third and fourth ribs.  Multiple enhancing liver lesions concerning for metastatic disease. - PET scan (04/06/2023): Right middle lobe primary bronchogenic carcinoma with mediastinal nodal, bone metastasis and probable upper abdominal nodal metastasis.  Right sided pneumothorax.  Metastasis with right acetabulum and proximal right femur.  Bilateral adrenal hypermetabolism with mild nodularity suspicious for early metastasis.  Right renal mass is not hypermetabolic.  No hepatic hypermetabolism. - Brain MRI (03/30/2023): 1.5 x 1.7 cm lesion in the left frontal lobe.  1.5 cm lesion in the high left frontal lobe.  5 mm lesion in the right frontal lobe.  Few other subcentimeter lesions, total of 10 reported. - Right pleural fluid cytology (04/11/2023): Malignant cells consistent with adenocarcinoma. - RML lung FNA (04/18/2023): Lung adenocarcinoma. - Guardant360: EGFR G719C, EGFR S768I.  MSI-high not detected. - [id number] tissue next (04/26/2023): PD-L1 22 C3 TPS<1%.  EGFR G719C, EGFR S768I, T p53, EGFR amplification, PIK3CA amplification, BRAF amplification, RB1 splice site SNV.  MSI-high not detected. - Afatinib  30 mg daily started on 05/18/2023, dose increased to 40 mg daily on 07/06/2023   2.  Social/family history: - He  lives by himself.  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  He had exposure to chemicals.  He quit smoking in 2012.  Smoked 1 pack/day starting age 28. - Maternal grandmother and maternal uncle had colon cancers.    Plan: 1.  Metastatic adenocarcinoma of the lung to the brain, bones and adrenals: - PET scan (03/31/2024): Mildly increased metabolic activity in the left medial clavicle head.  Increasing metabolic activity associated with peribronchial nodular thickening in the anterior right lung/middle lobe.  Stable right hilar and mediastinal adenopathy.  Both areas are not new and have mildly increased uptake.  Hence we have recommended to continue same therapy and repeat scan in 3 months. - Afatinib  40 mg daily was held on 04/26/2024 due to diarrhea and weight loss of 10 pounds. - He restarted back on afatinib  40 mg daily on 05/03/2024. - He still has 2 soft to watery bowel movements per day.  He gained 4 pounds. - I have recommended decreasing afatinib  to 30 mg daily. - I recommend PET CT scan and follow-up with me in the last week of July.    2.  Brain metastasis: - He had brain MRI on 05/16/2024: Results are pending at this time.   3.  Diarrhea: - C. difficile and GI panel were negative. - Diarrhea is better.  He is having 2 soft to watery bowel movements per day.  He is requiring Imodium twice weekly. - Will decrease the dose of afatinib .   4.  Bone metastasis: - Calcium is 8.6.  Continue Zometa  monthly.   5.  Hypokalemia: - Potassium is 3.4.  Continue K-Dur 20 mill equivalents daily.    Orders Placed This Encounter  Procedures   NM PET Image Restag (PS) Skull Base To Thigh    Standing Status:   Future    Expected Date:   06/21/2024    Expiration Date:   05/22/2025    If indicated for the ordered procedure, I authorize the administration of a radiopharmaceutical per Radiology protocol:    Yes    Preferred imaging location?:   [facility]      I,Theodore [name],acting as a scribe for Theodore Boros, MD.,have documented all relevant documentation on the behalf of Theodore Boros, MD,as directed by  Theodore Boros, MD while in the presence of Theodore Boros, MD.  I, Theodore Boros MD, have reviewed the above documentation for accuracy and completeness, and I agree with the above.      Theodore Boros, MD   6/11/20251:49 PM  CHIEF COMPLAINT:   Diagnosis: metastatic non-small cell adenocarcinoma    Cancer Staging  Lung cancer [facility]) Staging form: Lung, AJCC 8th Edition - Clinical stage from 04/24/2023: Stage IVB (cT1c, cN2, pM1c) - Signed by Theodore Boros, MD on 05/10/2023    Prior Therapy: palliative XRT to right scapula and right pelvis, 05/04/23 - 05/18/23   Current Therapy:  afatinib     HISTORY OF PRESENT ILLNESS:   Oncology History  Lung cancer (HCC)  04/10/2023 Initial Diagnosis   Lung cancer (HCC)   04/24/2023 Cancer Staging   Staging form: Lung, AJCC 8th Edition - Clinical stage from 04/24/2023: Stage IVB (cT1c, cN2, pM1c) - Signed by Theodore Boros, MD on 05/10/2023 Histopathologic type: Adenocarcinoma, NOS Stage prefix: Initial diagnosis      INTERVAL HISTORY:   Theodore [name] is a 68 y.o. male presenting to [name] today for follow up of metastatic non-small cell adenocarcinoma. He was last seen by me on 05/01/24.  Since his last visit, he underwent brain MRI on 05/16/24.   Today, he states that he is doing well overall. His appetite level is at 100%. His energy level is at 75%.  PAST MEDICAL HISTORY:   Past Medical History: Past Medical History:  Diagnosis Date   COPD (chronic obstructive pulmonary disease) (HCC)     Surgical History: Past Surgical History:  Procedure Laterality Date   BRONCHIAL BIOPSY  04/18/2023   Procedure: BRONCHIAL BIOPSIES;  Surgeon: Prudy Brownie, DO;  Location: [facility] ENDOSCOPY;   Service: Pulmonary;;   BRONCHIAL NEEDLE ASPIRATION BIOPSY  04/18/2023   Procedure: BRONCHIAL NEEDLE ASPIRATION BIOPSIES;  Surgeon: Prudy Brownie, DO;  Location: [facility] ENDOSCOPY;  Service: Pulmonary;;   CHEST TUBE INSERTION Right 04/11/2023   Procedure: CHEST TUBE INSERTION;  Surgeon: Gloriajean Large, MD;  Location: [facility] ENDOSCOPY;  Service: Pulmonary;  Laterality: Right;  fentanyl  IV only   excision of cyst left ankle     MASS EXCISION Left 08/27/2018   Procedure: EXCISION LIPOMA LEFT BUTTOCK;  Surgeon: Alanda Allegra, MD;  Location: [facility] ORS;  Service: General;  Laterality: Left;    Social History: Social History   Socioeconomic History   Marital status: Single    Spouse name: Not on file   Number of children: Not on file   Years of education: Not on file   Highest education level: Not on file  Occupational History   Not on file  Tobacco Use   Smoking status: Former    Current packs/day: 0.00    Types: Cigarettes    Quit date: 2012  Years since quitting: 13.4   Smokeless tobacco: Never  Vaping Use   Vaping status: Never Used  Substance and Sexual Activity   Alcohol use: Not Currently    Comment: Quit 1988   Drug use: Not Currently   Sexual activity: Yes    Birth control/protection: None  Other Topics Concern   Not on file  Social History Narrative   Not on file   Social Drivers of Health   Financial Resource Strain: Not on file  Food Insecurity: Food Insecurity Present (05/20/2024)   Hunger Vital Sign    Worried About Running Out of Food in the Last Year: Sometimes true    Ran Out of Food in the Last Year: Sometimes true  Transportation Needs: No Transportation Needs (05/02/2023)   PRAPARE - Administrator, Civil Service (Medical): No    Lack of Transportation (Non-Medical): No  Physical Activity: Not on file  Stress: Not on file  Social Connections: Not on file  Intimate Partner Violence: Not At Risk (05/02/2023)   Humiliation, Afraid, Rape, and Kick  questionnaire    Fear of Current or Ex-Partner: No    Emotionally Abused: No    Physically Abused: No    Sexually Abused: No    Family History: Family History  Problem Relation Age of Onset   Colon cancer Maternal Grandmother    Colon cancer Maternal Uncle     Current Medications:  Current Outpatient Medications:    acetaminophen  (TYLENOL ) 325 MG tablet, Take 2 tablets (650 mg total) by mouth every 6 (six) hours as needed for mild pain, moderate pain, headache or fever (or Fever >/= 101)., Disp: , Rfl:    albuterol  (PROVENTIL  HFA;VENTOLIN  HFA) 108 (90 Base) MCG/ACT inhaler, Inhale 2 puffs into the lungs every 4 (four) hours as needed for wheezing or shortness of breath., Disp: , Rfl:    BREZTRI AEROSPHERE 160-9-4.8 MCG/ACT AERO, SMARTSIG:2 Puff(s) By Mouth Twice Daily, Disp: , Rfl:    clindamycin  (CLINDAGEL) 1 % gel, APPLY TOPICALLY TO THE AFFECTED AREA TWICE DAILY, Disp: 30 g, Rfl: 0   diphenoxylate -atropine  (LOMOTIL ) 2.5-0.025 MG tablet, Take 1 tablet by mouth 4 (four) times daily as needed for diarrhea or loose stools., Disp: 120 tablet, Rfl: 3   folic acid  (FOLVITE ) 1 MG tablet, TAKE 1 TABLET BY MOUTH EVERY DAY, Disp: 90 tablet, Rfl: 3   HYDROCORTISONE , TOPICAL, 2.5 % SOLN, Apply topically twice daily., Disp: 30 mL, Rfl: 0   ibuprofen (ADVIL) 200 MG tablet, Take 400 mg by mouth every 6 (six) hours as needed for moderate pain., Disp: , Rfl:    LORazepam  (ATIVAN ) 0.5 MG tablet, 1 tab po 30 minutes prior to radiation or MRI scans, Disp: 30 tablet, Rfl: 0   potassium chloride  SA (KLOR-CON  M) 20 MEQ tablet, Take 1 tablet (20 mEq total) by mouth daily., Disp: 90 tablet, Rfl: 3   afatinib  dimaleate (GILOTRIF ) 30 MG tablet, Take 1 tablet (30 mg total) by mouth daily. Take on an empty stomach 1hr before or 2hrs after meals., Disp: 30 tablet, Rfl: 2   Allergies: Allergies  Allergen Reactions   Vueway  [Gadopiclenol ] Hives    REVIEW OF SYSTEMS:   Review of Systems  Constitutional:   Negative for chills, fatigue and fever.  HENT:   Negative for lump/mass, mouth sores, nosebleeds, sore throat and trouble swallowing.   Eyes:  Negative for eye problems.  Respiratory:  Positive for cough. Negative for shortness of breath.   Cardiovascular:  Negative for chest  pain, leg swelling and palpitations.  Gastrointestinal:  Positive for diarrhea. Negative for abdominal pain, constipation, nausea and vomiting.  Genitourinary:  Negative for bladder incontinence, difficulty urinating, dysuria, frequency, hematuria and nocturia.   Musculoskeletal:  Negative for arthralgias, back pain, flank pain, myalgias and neck pain.  Skin:  Negative for itching and rash.  Neurological:  Positive for dizziness and numbness. Negative for headaches.  Hematological:  Does not bruise/bleed easily.  Psychiatric/Behavioral:  Positive for sleep disturbance. Negative for depression and suicidal ideas. The patient is not nervous/anxious.   All other systems reviewed and are negative.    VITALS:   Blood pressure 129/78, pulse 73, temperature (!) 97.3 F (36.3 C), temperature source Oral, resp. rate 18, weight 104 lb 8 oz (47.4 kg), SpO2 100%.  Wt Readings from Last 3 Encounters:  05/22/24 104 lb 8 oz (47.4 kg)  05/01/24 100 lb 5 oz (45.5 kg)  04/26/24 100 lb 9.6 oz (45.6 kg)    Body mass index is 15.43 kg/m.  Performance status (ECOG): 1 - Symptomatic but completely ambulatory  PHYSICAL EXAM:   Physical Exam Vitals and nursing note reviewed. Exam conducted with a chaperone present.  Constitutional:      Appearance: Normal appearance.  Cardiovascular:     Rate and Rhythm: Normal rate and regular rhythm.     Pulses: Normal pulses.     Heart sounds: Normal heart sounds.  Pulmonary:     Effort: Pulmonary effort is normal.     Breath sounds: Normal breath sounds.  Abdominal:     Palpations: Abdomen is soft. There is no hepatomegaly, splenomegaly or mass.     Tenderness: There is no abdominal  tenderness.  Musculoskeletal:     Right lower leg: No edema.     Left lower leg: No edema.  Lymphadenopathy:     Cervical: No cervical adenopathy.     Right cervical: No superficial, deep or posterior cervical adenopathy.    Left cervical: No superficial, deep or posterior cervical adenopathy.     Upper Body:     Right upper body: No supraclavicular or axillary adenopathy.     Left upper body: No supraclavicular or axillary adenopathy.  Neurological:     General: No focal deficit present.     Mental Status: He is alert and oriented to person, place, and time.  Psychiatric:        Mood and Affect: Mood normal.        Behavior: Behavior normal.     LABS:      Latest Ref Rng & Units 05/22/2024   11:48 AM 04/26/2024    8:37 AM 03/27/2024   11:49 AM  CBC  WBC 4.0 - 10.5 K/uL 6.3  6.4  7.0   Hemoglobin 13.0 - 17.0 g/dL 89.2  11.9  41.7   Hematocrit 39.0 - 52.0 % 35.1  39.3  40.6   Platelets 150 - 400 K/uL 564  469  409       Latest Ref Rng & Units 05/22/2024   11:48 AM 04/26/2024    8:37 AM 03/27/2024   11:49 AM  CMP  Glucose 70 - 99 mg/dL 408  144  95   BUN 8 - 23 mg/dL 10  29  12    Creatinine 0.61 - 1.24 mg/dL 8.18  5.63  1.49   Sodium 135 - 145 mmol/L 135  130  135   Potassium 3.5 - 5.1 mmol/L 3.4  3.8  3.8   Chloride 98 - 111 mmol/L  99  94  97   CO2 22 - 32 mmol/L 26  23  26    Calcium 8.9 - 10.3 mg/dL 8.6  8.9  9.0   Total Protein 6.5 - 8.1 g/dL 7.6  8.5  7.9   Total Bilirubin 0.0 - 1.2 mg/dL 0.5  0.9  0.5   Alkaline Phos 38 - 126 U/L 153  146  140   AST 15 - 41 U/L 19  17  20    ALT 0 - 44 U/L 27  20  38      No results found for: CEA1, CEA / No results found for: CEA1, CEA No results found for: PSA1 No results found for: MVH846 No results found for: NGE952  No results found for: TOTALPROTELP, ALBUMINELP, A1GS, A2GS, BETS, BETA2SER, GAMS, MSPIKE, SPEI No results found for: TIBC, FERRITIN, IRONPCTSAT Lab Results  Component  Value Date   LDH 197 (H) 04/12/2023     STUDIES:   No results found.

## 2024-05-22 ENCOUNTER — Other Ambulatory Visit: Payer: Self-pay

## 2024-05-22 ENCOUNTER — Inpatient Hospital Stay

## 2024-05-22 ENCOUNTER — Other Ambulatory Visit (HOSPITAL_COMMUNITY): Payer: Self-pay

## 2024-05-22 ENCOUNTER — Inpatient Hospital Stay: Admitting: Hematology

## 2024-05-22 VITALS — BP 113/67 | HR 84 | Resp 18

## 2024-05-22 DIAGNOSIS — E876 Hypokalemia: Secondary | ICD-10-CM | POA: Diagnosis not present

## 2024-05-22 DIAGNOSIS — C342 Malignant neoplasm of middle lobe, bronchus or lung: Secondary | ICD-10-CM

## 2024-05-22 DIAGNOSIS — Z79899 Other long term (current) drug therapy: Secondary | ICD-10-CM | POA: Diagnosis not present

## 2024-05-22 DIAGNOSIS — C7951 Secondary malignant neoplasm of bone: Secondary | ICD-10-CM | POA: Diagnosis present

## 2024-05-22 DIAGNOSIS — C7931 Secondary malignant neoplasm of brain: Secondary | ICD-10-CM | POA: Diagnosis not present

## 2024-05-22 DIAGNOSIS — C787 Secondary malignant neoplasm of liver and intrahepatic bile duct: Secondary | ICD-10-CM | POA: Diagnosis not present

## 2024-05-22 DIAGNOSIS — Z87891 Personal history of nicotine dependence: Secondary | ICD-10-CM | POA: Diagnosis not present

## 2024-05-22 DIAGNOSIS — C3491 Malignant neoplasm of unspecified part of right bronchus or lung: Secondary | ICD-10-CM

## 2024-05-22 DIAGNOSIS — C7970 Secondary malignant neoplasm of unspecified adrenal gland: Secondary | ICD-10-CM | POA: Diagnosis not present

## 2024-05-22 LAB — CBC WITH DIFFERENTIAL/PLATELET
Abs Immature Granulocytes: 0.02 K/uL (ref 0.00–0.07)
Basophils Absolute: 0 K/uL (ref 0.0–0.1)
Basophils Relative: 0 %
Eosinophils Absolute: 0 K/uL (ref 0.0–0.5)
Eosinophils Relative: 1 %
HCT: 35.1 % — ABNORMAL LOW (ref 39.0–52.0)
Hemoglobin: 11.5 g/dL — ABNORMAL LOW (ref 13.0–17.0)
Immature Granulocytes: 0 %
Lymphocytes Relative: 23 %
Lymphs Abs: 1.4 K/uL (ref 0.7–4.0)
MCH: 27.3 pg (ref 26.0–34.0)
MCHC: 32.8 g/dL (ref 30.0–36.0)
MCV: 83.2 fL (ref 80.0–100.0)
Monocytes Absolute: 0.9 K/uL (ref 0.1–1.0)
Monocytes Relative: 14 %
Neutro Abs: 3.9 K/uL (ref 1.7–7.7)
Neutrophils Relative %: 62 %
Platelets: 564 K/uL — ABNORMAL HIGH (ref 150–400)
RBC: 4.22 MIL/uL (ref 4.22–5.81)
RDW: 16.1 % — ABNORMAL HIGH (ref 11.5–15.5)
WBC: 6.3 K/uL (ref 4.0–10.5)
nRBC: 0 % (ref 0.0–0.2)

## 2024-05-22 LAB — COMPREHENSIVE METABOLIC PANEL WITH GFR
ALT: 27 U/L (ref 0–44)
AST: 19 U/L (ref 15–41)
Albumin: 3 g/dL — ABNORMAL LOW (ref 3.5–5.0)
Alkaline Phosphatase: 153 U/L — ABNORMAL HIGH (ref 38–126)
Anion gap: 10 (ref 5–15)
BUN: 10 mg/dL (ref 8–23)
CO2: 26 mmol/L (ref 22–32)
Calcium: 8.6 mg/dL — ABNORMAL LOW (ref 8.9–10.3)
Chloride: 99 mmol/L (ref 98–111)
Creatinine, Ser: 0.73 mg/dL (ref 0.61–1.24)
GFR, Estimated: >60 mL/min (ref >60)
Glucose, Bld: 101 mg/dL — ABNORMAL HIGH (ref 70–99)
Potassium: 3.4 mmol/L — ABNORMAL LOW (ref 3.5–5.1)
Sodium: 135 mmol/L (ref 135–145)
Total Bilirubin: 0.5 mg/dL (ref 0.0–1.2)
Total Protein: 7.6 g/dL (ref 6.5–8.1)

## 2024-05-22 LAB — MAGNESIUM: Magnesium: 1.9 mg/dL (ref 1.7–2.4)

## 2024-05-22 MED ORDER — ZOLEDRONIC ACID 4 MG/100ML IV SOLN
4.0000 mg | Freq: Once | INTRAVENOUS | Status: AC
  Administered 2024-05-22: 4 mg via INTRAVENOUS
  Filled 2024-05-22: qty 100

## 2024-05-22 MED ORDER — AFATINIB DIMALEATE 30 MG PO TABS
30.0000 mg | ORAL_TABLET | Freq: Every day | ORAL | 2 refills | Status: DC
  Filled 2024-05-22 (×2): qty 30, 30d supply, fill #0
  Filled 2024-06-17: qty 30, 30d supply, fill #1
  Filled 2024-07-10: qty 30, 30d supply, fill #2

## 2024-05-22 MED ORDER — SODIUM CHLORIDE 0.9 % IV SOLN: Freq: Once | INTRAVENOUS | Status: AC

## 2024-05-22 NOTE — Patient Instructions (Addendum)
 [facility] at [facility] Discharge Instructions   You were seen and examined today by Dr. Cheree Cords.  He reviewed the results of your lab work which are normal/stable.   We will see you back in 6 weeks. We will repeat a PET scan prior to this visit.    Return as scheduled.    Thank you for choosing [facility] at [facility] to provide your oncology and hematology care.  To afford each patient quality time with our provider, please arrive at least 15 minutes before your scheduled appointment time.   If you have a lab appointment with the Cancer Center please come in thru the Main Entrance and check in at the main information desk.  You need to re-schedule your appointment should you arrive 10 or more minutes late.  We strive to give you quality time with our providers, and arriving late affects you and other patients whose appointments are after yours.  Also, if you no show three or more times for appointments you may be dismissed from the clinic at the providers discretion.     Again, thank you for choosing [facility].  Our hope is that these requests will decrease the amount of time that you wait before being seen by our physicians.       _____________________________________________________________  Should you have questions after your visit to [facility], please contact our office at [phone] and follow the prompts.  Our office hours are 8:00 a.m. and 4:30 p.m. Monday - Friday.  Please note that voicemails left after 4:00 p.m. may not be returned until the following business day.  We are closed weekends and major holidays.  You do have access to a nurse 24-7, just call the main number to the clinic [phone] and do not press any options, hold on the line and a nurse will answer the phone.    For prescription refill requests, have your pharmacy contact our office and allow 72 hours.    Due to Covid, you  will need to wear a mask upon entering the hospital. If you do not have a mask, a mask will be given to you at the Main Entrance upon arrival. For doctor visits, patients may have 1 support person age 63 or older with them. For treatment visits, patients can not have anyone with them due to social distancing guidelines and our immunocompromised population.

## 2024-05-22 NOTE — Progress Notes (Signed)
 Script for reduced dose received. Mail 6.11.25 or 6.12.25 to [address] [place] [id number]

## 2024-05-22 NOTE — Progress Notes (Signed)
 Patient tolerated Zometa  therapy with no complaints voiced. Labs reviewed. Side effects with management reviewed with understanding verbalized. Peripheral IV site clean and dry with no bruising or swelling noted at site. Good blood return noted before and after administration of therapy. Band aid applied. Patient left in satisfactory condition with VSS and no s/s of distress noted.   Keshona Kartes [employer]

## 2024-05-23 ENCOUNTER — Encounter: Payer: Self-pay | Admitting: Hematology

## 2024-05-23 MED ORDER — LORAZEPAM 0.5 MG PO TABS: ORAL_TABLET | ORAL | 0 refills | Status: AC

## 2024-06-03 ENCOUNTER — Telehealth: Payer: Self-pay | Admitting: Radiation Oncology

## 2024-06-03 DIAGNOSIS — C3491 Malignant neoplasm of unspecified part of right bronchus or lung: Secondary | ICD-10-CM

## 2024-06-03 DIAGNOSIS — C7931 Secondary malignant neoplasm of brain: Secondary | ICD-10-CM

## 2024-06-03 NOTE — Telephone Encounter (Signed)
 I spoke with the patient and we discussed his recent MRI scan. We will plan to repeat in 4 months time and he will let us  know if he's having any concerns sooner.

## 2024-06-04 ENCOUNTER — Other Ambulatory Visit: Payer: Self-pay | Admitting: *Deleted

## 2024-06-04 NOTE — Progress Notes (Signed)
 Order management

## 2024-06-05 ENCOUNTER — Inpatient Hospital Stay: Admitting: Licensed Clinical Social Worker

## 2024-06-05 DIAGNOSIS — C342 Malignant neoplasm of middle lobe, bronchus or lung: Secondary | ICD-10-CM

## 2024-06-05 NOTE — Progress Notes (Signed)
 [facility] CSW Progress Note  Clinical Child psychotherapist contacted patient by phone to follow-up on financial concerns.    Interventions: CSW provided instructions for pt to apply for Medicaid at DSS.  Pt verified that he does receive food stamps which would make him eligible for the [employer].  CSW instructed pt to bring in proof of income and an award letter for his food stamps to be left for [name] to apply for the grant.        Follow Up Plan:  Patient will contact CSW with any support or resource needs    Theodore JONELLE Manna, LCSW Clinical Social Worker [facility]

## 2024-06-11 ENCOUNTER — Other Ambulatory Visit: Payer: Self-pay

## 2024-06-12 ENCOUNTER — Encounter: Payer: Self-pay | Admitting: Hematology

## 2024-06-12 ENCOUNTER — Other Ambulatory Visit: Payer: Self-pay

## 2024-06-12 LAB — COLOGUARD: COLOGUARD: NEGATIVE

## 2024-06-17 ENCOUNTER — Other Ambulatory Visit: Payer: Self-pay

## 2024-06-17 ENCOUNTER — Inpatient Hospital Stay: Attending: Hematology | Admitting: Dietician

## 2024-06-17 ENCOUNTER — Telehealth: Payer: Self-pay | Admitting: Dietician

## 2024-06-17 NOTE — Telephone Encounter (Signed)
 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 S/p palliative XRT. Patient receiving Afatinib  40 mg daily (held 5/16 d/t diarrhea). Patient is under the care of Dr. Rogers.   Spoke with patient via telephone. He is feeling good. Patient says appetite is great. Diarrhea has resolved s/p Afatinib  dose reduction. He is tickled to see family happy he's eating better. Patient reports improved energy. He mowed grass yesterday. Reports cleaning house and keeping it tidy. Patient continues to supplement with one Ensure Complete. Takes this with morning medication. Patient asking for additional samples and coupons.    Medications: reviewed   Labs: 6/11 - K 3.4, albumin 3.0  Anthropometrics: Wt  104 lb 8 oz on 6/11 - improved   5/21 - 100 lb 5 oz 4/16 - 110 lb    NUTRITION DIAGNOSIS: Unintended wt loss improving    MALNUTRITION DIAGNOSIS: Severe malnutrition continues    INTERVENTION:  Continue strategies for increasing calories and protein Continue daily Ensure Complete - will leave samples + coupons for pt to p/u at [facility] visit     MONITORING, EVALUATION, GOAL: wt trends, intake   NEXT VISIT: To be scheduled as needed

## 2024-06-17 NOTE — Progress Notes (Signed)
 error

## 2024-06-17 NOTE — Progress Notes (Signed)
 Specialty Pharmacy Refill Coordination Note  Theodore [name] is a 68 y.o. male contacted today regarding refills of specialty medication(s) Afatinib  Dimaleate (GILOTRIF )   Patient requested Delivery   Delivery date: 06/19/24   [address] [address] [address]   Medication will be filled on 06/18/24.

## 2024-06-18 ENCOUNTER — Other Ambulatory Visit: Payer: Self-pay

## 2024-06-18 DIAGNOSIS — C342 Malignant neoplasm of middle lobe, bronchus or lung: Secondary | ICD-10-CM

## 2024-06-18 DIAGNOSIS — C7951 Secondary malignant neoplasm of bone: Secondary | ICD-10-CM

## 2024-06-19 ENCOUNTER — Inpatient Hospital Stay

## 2024-06-19 VITALS — BP 118/73 | HR 90 | Temp 96.8°F | Resp 18 | Wt 106.0 lb

## 2024-06-19 DIAGNOSIS — C342 Malignant neoplasm of middle lobe, bronchus or lung: Secondary | ICD-10-CM | POA: Diagnosis present

## 2024-06-19 DIAGNOSIS — C7951 Secondary malignant neoplasm of bone: Secondary | ICD-10-CM | POA: Diagnosis present

## 2024-06-19 LAB — CBC WITH DIFFERENTIAL/PLATELET
Abs Immature Granulocytes: 0.02 K/uL (ref 0.00–0.07)
Basophils Absolute: 0 K/uL (ref 0.0–0.1)
Basophils Relative: 0 %
Eosinophils Absolute: 0.1 K/uL (ref 0.0–0.5)
Eosinophils Relative: 1 %
HCT: 38.2 % — ABNORMAL LOW (ref 39.0–52.0)
Hemoglobin: 12.2 g/dL — ABNORMAL LOW (ref 13.0–17.0)
Immature Granulocytes: 0 %
Lymphocytes Relative: 22 %
Lymphs Abs: 1.8 K/uL (ref 0.7–4.0)
MCH: 26.6 pg (ref 26.0–34.0)
MCHC: 31.9 g/dL (ref 30.0–36.0)
MCV: 83.4 fL (ref 80.0–100.0)
Monocytes Absolute: 1 K/uL (ref 0.1–1.0)
Monocytes Relative: 13 %
Neutro Abs: 5 K/uL (ref 1.7–7.7)
Neutrophils Relative %: 64 %
Platelets: 450 K/uL — ABNORMAL HIGH (ref 150–400)
RBC: 4.58 MIL/uL (ref 4.22–5.81)
RDW: 17.7 % — ABNORMAL HIGH (ref 11.5–15.5)
WBC: 7.9 K/uL (ref 4.0–10.5)
nRBC: 0 % (ref 0.0–0.2)

## 2024-06-19 LAB — COMPREHENSIVE METABOLIC PANEL WITH GFR
ALT: 31 U/L (ref 0–44)
AST: 23 U/L (ref 15–41)
Albumin: 3.2 g/dL — ABNORMAL LOW (ref 3.5–5.0)
Alkaline Phosphatase: 153 U/L — ABNORMAL HIGH (ref 38–126)
Anion gap: 8 (ref 5–15)
BUN: 9 mg/dL (ref 8–23)
CO2: 26 mmol/L (ref 22–32)
Calcium: 8.6 mg/dL — ABNORMAL LOW (ref 8.9–10.3)
Chloride: 100 mmol/L (ref 98–111)
Creatinine, Ser: 0.7 mg/dL (ref 0.61–1.24)
GFR, Estimated: >60 mL/min (ref >60)
Glucose, Bld: 103 mg/dL — ABNORMAL HIGH (ref 70–99)
Potassium: 4 mmol/L (ref 3.5–5.1)
Sodium: 134 mmol/L — ABNORMAL LOW (ref 135–145)
Total Bilirubin: 0.5 mg/dL (ref 0.0–1.2)
Total Protein: 8 g/dL (ref 6.5–8.1)

## 2024-06-19 LAB — MAGNESIUM: Magnesium: 1.9 mg/dL (ref 1.7–2.4)

## 2024-06-19 MED ORDER — SODIUM CHLORIDE 0.9 % IV SOLN: Freq: Once | INTRAVENOUS | Status: AC

## 2024-06-19 MED ORDER — ZOLEDRONIC ACID 4 MG/100ML IV SOLN
4.0000 mg | Freq: Once | INTRAVENOUS | Status: AC
  Administered 2024-06-19: 4 mg via INTRAVENOUS
  Filled 2024-06-19: qty 100

## 2024-06-19 NOTE — Patient Instructions (Signed)
 [facility] [place] - A DEPT OF [facility]  Discharge Instructions: Thank you for choosing [facility] to provide your oncology and hematology care.  If you have a lab appointment with the Cancer Center - please note that after April 8th, 2024, all labs will be drawn in the cancer center.  You do not have to check in or register with the main entrance as you have in the past but will complete your check-in in the cancer center.  Wear comfortable clothing and clothing appropriate for easy access to any Portacath or PICC line.   We strive to give you quality time with your provider. You may need to reschedule your appointment if you arrive late (15 or more minutes).  Arriving late affects you and other patients whose appointments are after yours.  Also, if you miss three or more appointments without notifying the office, you may be dismissed from the clinic at the provider's discretion.      For prescription refill requests, have your pharmacy contact our office and allow 72 hours for refills to be completed.    Today you received the following chemotherapy and/or immunotherapy agents Zometa      To help prevent nausea and vomiting after your treatment, we encourage you to take your nausea medication as directed.  BELOW ARE SYMPTOMS THAT SHOULD BE REPORTED IMMEDIATELY: *FEVER GREATER THAN 100.4 F (38 C) OR HIGHER *CHILLS OR SWEATING *NAUSEA AND VOMITING THAT IS NOT CONTROLLED WITH YOUR NAUSEA MEDICATION *UNUSUAL SHORTNESS OF BREATH *UNUSUAL BRUISING OR BLEEDING *URINARY PROBLEMS (pain or burning when urinating, or frequent urination) *BOWEL PROBLEMS (unusual diarrhea, constipation, pain near the anus) TENDERNESS IN MOUTH AND THROAT WITH OR WITHOUT PRESENCE OF ULCERS (sore throat, sores in mouth, or a toothache) UNUSUAL RASH, SWELLING OR PAIN  UNUSUAL VAGINAL DISCHARGE OR ITCHING   Items with * indicate a potential emergency and should be followed up as  soon as possible or go to the Emergency Department if any problems should occur.  Please show the CHEMOTHERAPY ALERT CARD or IMMUNOTHERAPY ALERT CARD at check-in to the Emergency Department and triage nurse.  Should you have questions after your visit or need to cancel or reschedule your appointment, please contact [facility] [place] - A DEPT OF Eligha Bridegroom [facility] [phone]  and follow the prompts.  Office hours are 8:00 a.m. to 4:30 p.m. Monday - Friday. Please note that voicemails left after 4:00 p.m. may not be returned until the following business day.  We are closed weekends and major holidays. You have access to a nurse at all times for urgent questions. Please call the main number to the clinic [phone] and follow the prompts.  For any non-urgent questions, you may also contact your provider using MyChart. We now offer e-Visits for anyone 99 and older to request care online for non-urgent symptoms. For details visit [email or web address].   Also download the MyChart app! Go to the app store, search "MyChart", open the app, select [place], and log in with your MyChart username and password.

## 2024-06-19 NOTE — Progress Notes (Signed)
 Patient presents today fro Zometa  infusion per providers order.  Vital signs and labs within parameters for Zometa .  Patient has been taking Calcium/vitamins D supplements, has had no jaw pain and no prior or upcoming dental work.  No new complaints at this time.  Treatment given today per MD orders.  Stable during infusion without adverse affects.  Vital signs stable.  No complaints at this time.  Discharge from clinic ambulatory in stable condition.  Alert and oriented X 3.  Follow up with [facility] as scheduled.

## 2024-06-20 ENCOUNTER — Telehealth: Payer: Self-pay | Admitting: Hematology

## 2024-06-25 ENCOUNTER — Other Ambulatory Visit: Payer: Self-pay

## 2024-07-06 ENCOUNTER — Other Ambulatory Visit: Payer: Self-pay | Admitting: Hematology

## 2024-07-07 ENCOUNTER — Encounter: Payer: Self-pay | Admitting: Hematology

## 2024-07-10 ENCOUNTER — Other Ambulatory Visit: Payer: Self-pay

## 2024-07-10 ENCOUNTER — Encounter (INDEPENDENT_AMBULATORY_CARE_PROVIDER_SITE_OTHER): Payer: Self-pay

## 2024-07-10 DIAGNOSIS — C342 Malignant neoplasm of middle lobe, bronchus or lung: Secondary | ICD-10-CM

## 2024-07-10 DIAGNOSIS — C7951 Secondary malignant neoplasm of bone: Secondary | ICD-10-CM

## 2024-07-10 NOTE — Progress Notes (Signed)
 Specialty Pharmacy Refill Coordination Note  Theodore [name] is a 68 y.o. male contacted today regarding refills of specialty medication(s) Afatinib  Dimaleate (GILOTRIF )   Patient requested (Patient-Rptd) Delivery   Delivery date: 07/11/24   Verified address: (Patient-Rptd) [address]  [place] [address]   Medication will be filled on 07/10/24.

## 2024-07-11 ENCOUNTER — Inpatient Hospital Stay

## 2024-07-11 ENCOUNTER — Ambulatory Visit (HOSPITAL_COMMUNITY)
Admission: RE | Admit: 2024-07-11 | Discharge: 2024-07-11 | Disposition: A | Discharge status: Home / Self Care | Source: Ambulatory Visit | Attending: Hematology | Admitting: Hematology

## 2024-07-11 DIAGNOSIS — C342 Malignant neoplasm of middle lobe, bronchus or lung: Secondary | ICD-10-CM | POA: Diagnosis present

## 2024-07-11 DIAGNOSIS — C7951 Secondary malignant neoplasm of bone: Secondary | ICD-10-CM

## 2024-07-11 LAB — CBC WITH DIFFERENTIAL/PLATELET
Abs Immature Granulocytes: 0.03 K/uL (ref 0.00–0.07)
Basophils Absolute: 0 K/uL (ref 0.0–0.1)
Basophils Relative: 1 %
Eosinophils Absolute: 0.2 K/uL (ref 0.0–0.5)
Eosinophils Relative: 3 %
HCT: 37.4 % — ABNORMAL LOW (ref 39.0–52.0)
Hemoglobin: 12 g/dL — ABNORMAL LOW (ref 13.0–17.0)
Immature Granulocytes: 0 %
Lymphocytes Relative: 28 %
Lymphs Abs: 2.1 K/uL (ref 0.7–4.0)
MCH: 26.5 pg (ref 26.0–34.0)
MCHC: 32.1 g/dL (ref 30.0–36.0)
MCV: 82.6 fL (ref 80.0–100.0)
Monocytes Absolute: 0.9 K/uL (ref 0.1–1.0)
Monocytes Relative: 12 %
Neutro Abs: 4.3 K/uL (ref 1.7–7.7)
Neutrophils Relative %: 56 %
Platelets: 420 K/uL — ABNORMAL HIGH (ref 150–400)
RBC: 4.53 MIL/uL (ref 4.22–5.81)
RDW: 18.3 % — ABNORMAL HIGH (ref 11.5–15.5)
WBC: 7.5 K/uL (ref 4.0–10.5)
nRBC: 0 % (ref 0.0–0.2)

## 2024-07-11 LAB — MAGNESIUM: Magnesium: 1.9 mg/dL (ref 1.7–2.4)

## 2024-07-11 LAB — COMPREHENSIVE METABOLIC PANEL WITH GFR
ALT: 24 U/L (ref 0–44)
AST: 19 U/L (ref 15–41)
Albumin: 3.2 g/dL — ABNORMAL LOW (ref 3.5–5.0)
Alkaline Phosphatase: 147 U/L — ABNORMAL HIGH (ref 38–126)
Anion gap: 8 (ref 5–15)
BUN: 9 mg/dL (ref 8–23)
CO2: 26 mmol/L (ref 22–32)
Calcium: 8.5 mg/dL — ABNORMAL LOW (ref 8.9–10.3)
Chloride: 101 mmol/L (ref 98–111)
Creatinine, Ser: 0.75 mg/dL (ref 0.61–1.24)
GFR, Estimated: >60 mL/min (ref >60)
Glucose, Bld: 104 mg/dL — ABNORMAL HIGH (ref 70–99)
Potassium: 4.1 mmol/L (ref 3.5–5.1)
Sodium: 135 mmol/L (ref 135–145)
Total Bilirubin: 0.7 mg/dL (ref 0.0–1.2)
Total Protein: 7.9 g/dL (ref 6.5–8.1)

## 2024-07-11 MED ORDER — FLUDEOXYGLUCOSE F - 18 (FDG) INJECTION
5.7140 | Freq: Once | INTRAVENOUS | Status: AC | PRN
  Administered 2024-07-11: 5.714 via INTRAVENOUS

## 2024-07-17 ENCOUNTER — Other Ambulatory Visit (HOSPITAL_COMMUNITY): Payer: Self-pay

## 2024-07-17 ENCOUNTER — Other Ambulatory Visit

## 2024-07-17 ENCOUNTER — Encounter: Payer: Self-pay | Admitting: Hematology

## 2024-07-17 ENCOUNTER — Inpatient Hospital Stay: Attending: Hematology

## 2024-07-17 ENCOUNTER — Telehealth: Payer: Self-pay | Admitting: Pharmacist

## 2024-07-17 ENCOUNTER — Inpatient Hospital Stay (HOSPITAL_BASED_OUTPATIENT_CLINIC_OR_DEPARTMENT_OTHER): Admitting: Hematology

## 2024-07-17 VITALS — BP 127/77 | HR 95 | Temp 98.4°F | Ht 69.0 in | Wt 108.0 lb

## 2024-07-17 DIAGNOSIS — E876 Hypokalemia: Secondary | ICD-10-CM | POA: Diagnosis not present

## 2024-07-17 DIAGNOSIS — C342 Malignant neoplasm of middle lobe, bronchus or lung: Secondary | ICD-10-CM | POA: Insufficient documentation

## 2024-07-17 DIAGNOSIS — Z800 Family history of malignant neoplasm of digestive organs: Secondary | ICD-10-CM | POA: Insufficient documentation

## 2024-07-17 DIAGNOSIS — C7951 Secondary malignant neoplasm of bone: Secondary | ICD-10-CM | POA: Diagnosis present

## 2024-07-17 DIAGNOSIS — Z79899 Other long term (current) drug therapy: Secondary | ICD-10-CM | POA: Insufficient documentation

## 2024-07-17 DIAGNOSIS — R197 Diarrhea, unspecified: Secondary | ICD-10-CM | POA: Insufficient documentation

## 2024-07-17 DIAGNOSIS — Z87891 Personal history of nicotine dependence: Secondary | ICD-10-CM | POA: Insufficient documentation

## 2024-07-17 DIAGNOSIS — C7931 Secondary malignant neoplasm of brain: Secondary | ICD-10-CM | POA: Insufficient documentation

## 2024-07-17 MED ORDER — OSIMERTINIB MESYLATE 80 MG PO TABS
80.0000 mg | ORAL_TABLET | Freq: Every day | ORAL | 3 refills | Status: AC
  Filled 2024-07-19: qty 30, 30d supply, fill #0
  Filled 2024-08-09: qty 30, 30d supply, fill #1
  Filled 2024-09-16: qty 30, 30d supply, fill #2
  Filled 2024-10-10: qty 30, 30d supply, fill #3

## 2024-07-17 MED ORDER — IBUPROFEN 800 MG PO TABS
800.0000 mg | ORAL_TABLET | Freq: Two times a day (BID) | ORAL | 0 refills | Status: AC | PRN
Start: 2024-07-17 — End: ?

## 2024-07-17 MED ORDER — SODIUM CHLORIDE 0.9 % IV SOLN: Freq: Once | INTRAVENOUS | Status: AC

## 2024-07-17 MED ORDER — ZOLEDRONIC ACID 4 MG/100ML IV SOLN
4.0000 mg | Freq: Once | INTRAVENOUS | Status: AC
  Administered 2024-07-17: 4 mg via INTRAVENOUS
  Filled 2024-07-17: qty 100

## 2024-07-17 NOTE — Progress Notes (Signed)
 Patient presents today for Zometa  infusion.  Patient is in satisfactory condition with no new complaints voiced.  Vital signs are stable.  IV placed in L arm.  IV flushed well with good blood return noted.  We will proceed with infusion per provider orders.    Patient tolerated treatment well with no complaints voiced.  Patient left ambulatory with rolling walker in stable condition.  Vital signs stable at discharge.  Follow up as scheduled.

## 2024-07-17 NOTE — Telephone Encounter (Signed)
 Clinical Pharmacist Practitioner Encounter  New authorization  Received notification from [employer] that prior authorization for Tagrisso  is required.   PA submitted on [facility] Status is pending   Oral Oncology Clinic will continue to follow.   Hayk Divis N. Jaymison Luber, PharmD, BCOP, CPP Hematology/Oncology Clinical Pharmacist [facility] Oral Chemotherapy Navigation Clinic [phone]  07/17/2024 4:43 PM

## 2024-07-17 NOTE — Patient Instructions (Signed)
 [facility] [place] - A DEPT OF [facility]  Discharge Instructions: Thank you for choosing [facility] to provide your oncology and hematology care.  If you have a lab appointment with the Cancer Center - please note that after April 8th, 2024, all labs will be drawn in the cancer center.  You do not have to check in or register with the main entrance as you have in the past but will complete your check-in in the cancer center.  Wear comfortable clothing and clothing appropriate for easy access to any Portacath or PICC line.   We strive to give you quality time with your provider. You may need to reschedule your appointment if you arrive late (15 or more minutes).  Arriving late affects you and other patients whose appointments are after yours.  Also, if you miss three or more appointments without notifying the office, you may be dismissed from the clinic at the provider's discretion.      For prescription refill requests, have your pharmacy contact our office and allow 72 hours for refills to be completed.    Today you received the following:  Zometa.  Zoledronic Acid Injection (Cancer) What is this medication? ZOLEDRONIC ACID (ZOE le dron ik AS id) treats high calcium levels in the blood caused by cancer. It may also be used with chemotherapy to treat weakened bones caused by cancer. It works by slowing down the release of calcium from bones. This lowers calcium levels in your blood. It also makes your bones stronger and less likely to break (fracture). It belongs to a group of medications called bisphosphonates. This medicine may be used for other purposes; ask your health care provider or pharmacist if you have questions. COMMON BRAND NAME(S): Zometa, Zometa Powder What should I tell my care team before I take this medication? They need to know if you have any of these conditions: Dehydration Dental disease Kidney disease Liver disease Low levels of  calcium in the blood Lung or breathing disease, such as asthma Receiving steroids, such as dexamethasone or prednisone An unusual or allergic reaction to zoledronic acid, other medications, foods, dyes, or preservatives Pregnant or trying to get pregnant Breast-feeding How should I use this medication? This medication is injected into a vein. It is given by your care team in a hospital or clinic setting. Talk to your care team about the use of this medication in children. Special care may be needed. Overdosage: If you think you have taken too much of this medicine contact a poison control center or emergency room at once. NOTE: This medicine is only for you. Do not share this medicine with others. What if I miss a dose? Keep appointments for follow-up doses. It is important not to miss your dose. Call your care team if you are unable to keep an appointment. What may interact with this medication? Certain antibiotics given by injection Diuretics, such as bumetanide, furosemide NSAIDs, medications for pain and inflammation, such as ibuprofen or naproxen Teriparatide Thalidomide This list may not describe all possible interactions. Give your health care provider a list of all the medicines, herbs, non-prescription drugs, or dietary supplements you use. Also tell them if you smoke, drink alcohol, or use illegal drugs. Some items may interact with your medicine. What should I watch for while using this medication? Visit your care team for regular checks on your progress. It may be some time before you see the benefit from this medication. Some  people who take this medication have severe bone, joint, or muscle pain. This medication may also increase your risk for jaw problems or a broken thigh bone. Tell your care team right away if you have severe pain in your jaw, bones, joints, or muscles. Tell you care team if you have any pain that does not go away or that gets worse. Tell your dentist and  dental surgeon that you are taking this medication. You should not have major dental surgery while on this medication. See your dentist to have a dental exam and fix any dental problems before starting this medication. Take good care of your teeth while on this medication. Make sure you see your dentist for regular follow-up appointments. You should make sure you get enough calcium and vitamin D while you are taking this medication. Discuss the foods you eat and the vitamins you take with your care team. Check with your care team if you have severe diarrhea, nausea, and vomiting, or if you sweat a lot. The loss of too much body fluid may make it dangerous for you to take this medication. You may need bloodwork while taking this medication. Talk to your care team if you wish to become pregnant or think you might be pregnant. This medication can cause serious birth defects. What side effects may I notice from receiving this medication? Side effects that you should report to your care team as soon as possible: Allergic reactions--skin rash, itching, hives, swelling of the face, lips, tongue, or throat Kidney injury--decrease in the amount of urine, swelling of the ankles, hands, or feet Low calcium level--muscle pain or cramps, confusion, tingling, or numbness in the hands or feet Osteonecrosis of the jaw--pain, swelling, or redness in the mouth, numbness of the jaw, poor healing after dental work, unusual discharge from the mouth, visible bones in the mouth Severe bone, joint, or muscle pain Side effects that usually do not require medical attention (report to your care team if they continue or are bothersome): Constipation Fatigue Fever Loss of appetite Nausea Stomach pain This list may not describe all possible side effects. Call your doctor for medical advice about side effects. You may report side effects to FDA at 1-800-FDA-1088. Where should I keep my medication? This medication is given in  a hospital or clinic. It will not be stored at home. NOTE: This sheet is a summary. It may not cover all possible information. If you have questions about this medicine, talk to your doctor, pharmacist, or health care provider.  2024 Elsevier/Gold Standard (2022-01-21 00:00:00)     To help prevent nausea and vomiting after your treatment, we encourage you to take your nausea medication as directed.  BELOW ARE SYMPTOMS THAT SHOULD BE REPORTED IMMEDIATELY: *FEVER GREATER THAN 100.4 F (38 C) OR HIGHER *CHILLS OR SWEATING *NAUSEA AND VOMITING THAT IS NOT CONTROLLED WITH YOUR NAUSEA MEDICATION *UNUSUAL SHORTNESS OF BREATH *UNUSUAL BRUISING OR BLEEDING *URINARY PROBLEMS (pain or burning when urinating, or frequent urination) *BOWEL PROBLEMS (unusual diarrhea, constipation, pain near the anus) TENDERNESS IN MOUTH AND THROAT WITH OR WITHOUT PRESENCE OF ULCERS (sore throat, sores in mouth, or a toothache) UNUSUAL RASH, SWELLING OR PAIN  UNUSUAL VAGINAL DISCHARGE OR ITCHING   Items with * indicate a potential emergency and should be followed up as soon as possible or go to the Emergency Department if any problems should occur.  Please show the CHEMOTHERAPY ALERT CARD or IMMUNOTHERAPY ALERT CARD at check-in to the Emergency Department and triage nurse.  Should you have questions after your visit or need to cancel or reschedule your appointment, please contact [facility] [place] - A DEPT OF Eligha Bridegroom [facility] [phone]  and follow the prompts.  Office hours are 8:00 a.m. to 4:30 p.m. Monday - Friday. Please note that voicemails left after 4:00 p.m. may not be returned until the following business day.  We are closed weekends and major holidays. You have access to a nurse at all times for urgent questions. Please call the main number to the clinic [phone] and follow the prompts.  For any non-urgent questions, you may also contact your provider using MyChart. We now offer  e-Visits for anyone 37 and older to request care online for non-urgent symptoms. For details visit [email or web address].   Also download the MyChart app! Go to the app store, search "MyChart", open the app, select , and log in with your MyChart username and password.

## 2024-07-17 NOTE — Patient Instructions (Addendum)
 [facility]  Discharge Instructions  You were seen and examined today by Dr. Rogers.  Dr. 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   Dr. Rogers wants you to stop the Gliotrif. He is switching your treatment to another pill. Start taking the new medication Tagrisso  80 mg that Dr. Rogers is sending into the pharmacy when you get it. You do not have to take this new pill on an empty stomach it may be taken with or without food.  Dr. Rogers sent in ibuprofen  800 mg to the pharmacy to take for your pain only as needed once or twice daily at most.  Follow-up as scheduled.    Thank you for choosing [name] to provide your oncology and hematology care.   To afford each patient quality time with our provider, please arrive at least 15 minutes before your scheduled appointment time. You may need to reschedule your appointment if you arrive late (10 or more minutes). Arriving late affects you and other patients whose appointments are after yours.  Also, if you miss three or more appointments without notifying the office, you may be dismissed from the clinic at the provider's discretion.    Again, thank you for choosing [facility].  Our hope is that these requests will decrease the amount of time that you wait before being seen by our physicians.   If you have a lab appointment with the Cancer Center - please note that after April 8th, all labs will be drawn in the cancer center.  You do not have to check in or register with the main entrance as you have in the past but will complete your check-in at the cancer center.            _____________________________________________________________  Should you have questions after your visit to [facility], please contact our office at [phone] and follow the prompts.  Our office hours are 8:00 a.m. to 4:30 p.m. Monday - Thursday and 8:00 a.m. to 2:30 p.m. Friday.  Please note that voicemails left after 4:00 p.m. may not be returned until the following business day.  We are closed weekends and all major holidays.  You do have access to a nurse 24-7, just call the main number to the clinic [phone] and do not press any options, hold on the line and a nurse will answer the phone.    For prescription refill requests, have your pharmacy contact our office and allow 72 hours.    Masks are no longer required in the cancer centers. If you would like for your care team to wear a mask while they are taking care of you, please let them know. You may have one support person who is at least 69 years old accompany you for your appointments.

## 2024-07-17 NOTE — Progress Notes (Signed)
 [facility] [address], [place] [address]    Clinic Day:  07/17/2024  Referring physician: Shona Norleen PEDLAR, MD  Patient Care Team: Shona Norleen PEDLAR, MD as PCP - General (Internal Medicine)   ASSESSMENT & PLAN:   Assessment: 1.  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 Nemiah with a chest x-ray on 03/15/2023, with right pleural effusion. - CT chest (03/17/2023): Right middle lobe mass with complete collapse of the right middle lobe, moderate right pleural effusion, nodularity of the right minor fissure, lytic lesions of the right scapula and left third and fourth ribs.  Multiple enhancing liver lesions concerning for metastatic disease. - PET scan (04/06/2023): Right middle lobe primary bronchogenic carcinoma with mediastinal nodal, bone metastasis and probable upper abdominal nodal metastasis.  Right sided pneumothorax.  Metastasis with right acetabulum and proximal right femur.  Bilateral adrenal hypermetabolism with mild nodularity suspicious for early metastasis.  Right renal mass is not hypermetabolic.  No hepatic hypermetabolism. - Brain MRI (03/30/2023): 1.5 x 1.7 cm lesion in the left frontal lobe.  1.5 cm lesion in the high left frontal lobe.  5 mm lesion in the right frontal lobe.  Few other subcentimeter lesions, total of 10 reported. - Right pleural fluid cytology (04/11/2023): Malignant cells consistent with adenocarcinoma. - RML lung FNA (04/18/2023): Lung adenocarcinoma. - Guardant360: EGFR G719C, EGFR S768I.  MSI-high not detected. - [id number] tissue next (04/26/2023): PD-L1 22 C3 TPS<1%.  EGFR G719C, EGFR S768I, T p53, EGFR amplification, PIK3CA amplification, BRAF amplification, RB1 splice site SNV.  MSI-high not detected. - Afatinib  30 mg daily started on 05/18/2023, dose increased to 40 mg daily on 07/06/2023   2.  Social/family history: - He  lives by himself.  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  He had exposure to chemicals.  He quit smoking in 2012.  Smoked 1 pack/day starting age 45. - Maternal grandmother and maternal uncle had colon cancers.    Plan: 1.  Metastatic adenocarcinoma of the lung to the brain, bones and adrenals: - At last visit we decreased afatinib  to 30 mg daily as he was having a lot of diarrhea.  Diarrhea has improved after dose reduction. - He reported left clavicular head swelling and pain for the last 2 weeks. - We reviewed PET scan from 07/11/2024: Right middle lobe hypermetabolic nodule slightly increased in size with increasing SUV.  Right axillary lymph node 8 mm with SUV 4.6.  Mediastinal and right hilar hypermetabolism has increased.  New and progressive bone metastasis.  Left clavicular head lesion with SUV 8.2. - Because of progression, I have recommended discontinuing afatinib . - We talked about subsequent therapy with osimertinib  which has the highest likelihood of response in this disease with uncommon EGFR mutations ( EGFR S786I, G719C) which this patient has. - He will start taking osimertinib  80 mg daily as soon as he receives shipment.  We discussed side effects in detail.  RTC 4 weeks for follow-up. - For the left clavicular pain, I have recommended ibuprofen  800 mg 1-2 times daily, to use sparingly.  If there is no improvement in the pain after starting osimertinib , consider radiation.   2.  Brain metastasis: - Brain MRI on 05/16/2024 showed continued improvement.  He will continue MRI every 3 months.  3.  Diarrhea: - Previous testing for infectious etiology was negative. - Diarrhea completely resolved after afatinib  dose reduction to 30 mg daily.  He has gained 4 pounds since last visit.   4.  Bone metastasis: - Calcium today is 8.5 with almond 3.2.  Continue Zometa  monthly.   5.   Hypokalemia: - Potassium is 4.1.  Continue potassium supplements.  If it continues to be high, will consider discontinuation of potassium.    Orders Placed This Encounter  Procedures   CBC with Differential/Platelet    Standing Status:   Future    Expected Date:   08/14/2024    Expiration Date:   11/12/2024    Release to patient:   Immediate   Comprehensive metabolic panel with GFR    Standing Status:   Future    Expected Date:   08/14/2024    Expiration Date:   11/12/2024    Release to patient:   Immediate   Magnesium     Standing Status:   Future    Expected Date:   08/14/2024    Expiration Date:   11/12/2024    Release to patient:   Immediate      I,Helena R Teague,acting as a scribe for Alean Stands, MD.,have documented all relevant documentation on the behalf of Alean Stands, MD,as directed by  Alean Stands, MD while in the presence of Alean Stands, MD.  I, Alean Stands MD, have reviewed the above documentation for accuracy and completeness, and I agree with the above.      Alean Stands, MD   8/6/20254:14 PM  CHIEF COMPLAINT:   Diagnosis: metastatic non-small cell adenocarcinoma    Cancer Staging  Lung cancer [facility]) Staging form: Lung, AJCC 8th Edition - Clinical stage from 04/24/2023: Stage IVB (cT1c, cN2, pM1c) - Signed by Stands Alean, MD on 05/10/2023    Prior Therapy: palliative XRT to right scapula and right pelvis, 05/04/23 - 05/18/23   Current Therapy:  afatinib     HISTORY OF PRESENT ILLNESS:   Oncology History  Lung cancer (HCC)  04/10/2023 Initial Diagnosis   Lung cancer (HCC)   04/24/2023 Cancer Staging   Staging form: Lung, AJCC 8th Edition - Clinical stage from 04/24/2023: Stage IVB (cT1c, cN2, pM1c) - Signed by Stands Alean, MD on 05/10/2023 Histopathologic type: Adenocarcinoma, NOS Stage prefix: Initial diagnosis      INTERVAL HISTORY:   Theodore [name] is a 69 y.o. male presenting to clinic today for  follow up of metastatic non-small cell adenocarcinoma. He was last seen by me on 05/22/2024.  Since his last visit, he underwent restaging PET on 07/11/2024 that found: Mild disease progression. Increased hypermetabolic right middle lobe nodule, right pleural, thoracic nodal, and osseous metastasis as detailed above. Mild increase in loculated moderate right pleural effusion. Incidental findings, including: Coronary artery atherosclerosis. Aortic Atherosclerosis and Emphysema.   Today, he states that he is doing well overall. His appetite level is at 100%. His energy level is at 75%.  PAST MEDICAL HISTORY:   Past Medical History: Past Medical History:  Diagnosis Date   COPD (chronic obstructive pulmonary disease) (HCC)     Surgical History: Past Surgical History:  Procedure Laterality Date   BRONCHIAL BIOPSY  04/18/2023   Procedure: BRONCHIAL BIOPSIES;  Surgeon: Brenna Adine CROME, DO;  Location: [facility] ENDOSCOPY;  Service: Pulmonary;;   BRONCHIAL NEEDLE ASPIRATION BIOPSY  04/18/2023   Procedure: BRONCHIAL NEEDLE ASPIRATION BIOPSIES;  Surgeon: Brenna Adine CROME, DO;  Location: [facility] ENDOSCOPY;  Service: Pulmonary;;   CHEST TUBE  INSERTION Right 04/11/2023   Procedure: CHEST TUBE INSERTION;  Surgeon: Gladis Leonor HERO, MD;  Location: [facility] ENDOSCOPY;  Service: Pulmonary;  Laterality: Right;  fentanyl  IV only   excision of cyst left ankle     MASS EXCISION Left 08/27/2018   Procedure: EXCISION LIPOMA LEFT BUTTOCK;  Surgeon: Mavis Anes, MD;  Location: [facility] ORS;  Service: General;  Laterality: Left;    Social History: Social History   Socioeconomic History   Marital status: Single    Spouse name: Not on file   Number of children: Not on file   Years of education: Not on file   Highest education level: Not on file  Occupational History   Not on file  Tobacco Use   Smoking status: Former    Current packs/day: 0.00    Types: Cigarettes    Quit date: 2012    Years since quitting: 13.6   Smokeless  tobacco: Never  Vaping Use   Vaping status: Never Used  Substance and Sexual Activity   Alcohol use: Not Currently    Comment: Quit 1988   Drug use: Not Currently   Sexual activity: Yes    Birth control/protection: None  Other Topics Concern   Not on file  Social History Narrative   Not on file   Social Drivers of Health   Financial Resource Strain: Not on file  Food Insecurity: Food Insecurity Present (05/20/2024)   Hunger Vital Sign    Worried About Running Out of Food in the Last Year: Sometimes true    Ran Out of Food in the Last Year: Sometimes true  Transportation Needs: No Transportation Needs (05/02/2023)   PRAPARE - Administrator, Civil Service (Medical): No    Lack of Transportation (Non-Medical): No  Physical Activity: Not on file  Stress: Not on file  Social Connections: Not on file  Intimate Partner Violence: Not At Risk (05/02/2023)   Humiliation, Afraid, Rape, and Kick questionnaire    Fear of Current or Ex-Partner: No    Emotionally Abused: No    Physically Abused: No    Sexually Abused: No    Family History: Family History  Problem Relation Age of Onset   Colon cancer Maternal Grandmother    Colon cancer Maternal Uncle     Current Medications:  Current Outpatient Medications:    acetaminophen  (TYLENOL ) 325 MG tablet, Take 2 tablets (650 mg total) by mouth every 6 (six) hours as needed for mild pain, moderate pain, headache or fever (or Fever >/= 101)., Disp: , Rfl:    afatinib  dimaleate (GILOTRIF ) 30 MG tablet, Take 1 tablet (30 mg total) by mouth daily. Take on an empty stomach 1hr before or 2hrs after meals., Disp: 30 tablet, Rfl: 2   albuterol  (PROVENTIL  HFA;VENTOLIN  HFA) 108 (90 Base) MCG/ACT inhaler, Inhale 2 puffs into the lungs every 4 (four) hours as needed for wheezing or shortness of breath., Disp: , Rfl:    BREZTRI AEROSPHERE 160-9-4.8 MCG/ACT AERO, SMARTSIG:2 Puff(s) By Mouth Twice Daily, Disp: , Rfl:    clindamycin  (CLINDAGEL) 1  % gel, APPLY TOPICALLY TO THE AFFECTED AREA TWICE DAILY, Disp: 30 g, Rfl: 0   diphenoxylate -atropine  (LOMOTIL ) 2.5-0.025 MG tablet, Take 1 tablet by mouth 4 (four) times daily as needed for diarrhea or loose stools., Disp: 120 tablet, Rfl: 3   folic acid  (FOLVITE ) 1 MG tablet, TAKE 1 TABLET BY MOUTH EVERY DAY, Disp: 90 tablet, Rfl: 3   HYDROCORTISONE , TOPICAL, 2.5 % SOLN, Apply topically twice daily.,  Disp: 30 mL, Rfl: 0   ibuprofen  (ADVIL ) 200 MG tablet, Take 400 mg by mouth every 6 (six) hours as needed for moderate pain., Disp: , Rfl:    ibuprofen  (ADVIL ) 800 MG tablet, Take 1 tablet (800 mg total) by mouth every 12 (twelve) hours as needed., Disp: 30 tablet, Rfl: 0   LORazepam  (ATIVAN ) 0.5 MG tablet, 1 tab po 30 minutes prior to radiation or MRI scans, Disp: 30 tablet, Rfl: 0   osimertinib  mesylate (TAGRISSO ) 80 MG tablet, Take 1 tablet (80 mg total) by mouth daily., Disp: 30 tablet, Rfl: 3   potassium chloride  SA (KLOR-CON  M) 20 MEQ tablet, TAKE 1 TABLET(20 MEQ) BY MOUTH DAILY, Disp: 90 tablet, Rfl: 3   Allergies: Allergies  Allergen Reactions   Vueway  [Gadopiclenol ] Hives    REVIEW OF SYSTEMS:   Review of Systems  Constitutional:  Negative for chills, fatigue and fever.  HENT:   Negative for lump/mass, mouth sores, nosebleeds, sore throat and trouble swallowing.   Eyes:  Negative for eye problems.  Respiratory:  Positive for cough and shortness of breath.   Cardiovascular:  Negative for chest pain, leg swelling and palpitations.  Gastrointestinal:  Negative for abdominal pain, constipation, diarrhea, nausea and vomiting.  Genitourinary:  Negative for bladder incontinence, difficulty urinating, dysuria, frequency, hematuria and nocturia.   Musculoskeletal:  Negative for arthralgias, back pain, flank pain, myalgias and neck pain.  Skin:  Negative for itching and rash.  Neurological:  Positive for dizziness and numbness. Negative for headaches.  Hematological:  Does not bruise/bleed  easily.  Psychiatric/Behavioral:  Negative for depression, sleep disturbance and suicidal ideas. The patient is not nervous/anxious.   All other systems reviewed and are negative.    VITALS:   Blood pressure 127/77, pulse 95, temperature 98.4 F (36.9 C), temperature source Oral, height 5' 9 (1.753 m), weight 108 lb 0.4 oz (49 kg), SpO2 98%.  Wt Readings from Last 3 Encounters:  07/17/24 108 lb 0.4 oz (49 kg)  06/19/24 106 lb (48.1 kg)  05/22/24 104 lb 8 oz (47.4 kg)    Body mass index is 15.95 kg/m.  Performance status (ECOG): 1 - Symptomatic but completely ambulatory  PHYSICAL EXAM:   Physical Exam Vitals and nursing note reviewed. Exam conducted with a chaperone present.  Constitutional:      Appearance: Normal appearance.  Cardiovascular:     Rate and Rhythm: Normal rate and regular rhythm.     Pulses: Normal pulses.     Heart sounds: Normal heart sounds.  Pulmonary:     Effort: Pulmonary effort is normal.     Breath sounds: Normal breath sounds.  Abdominal:     Palpations: Abdomen is soft. There is no hepatomegaly, splenomegaly or mass.     Tenderness: There is no abdominal tenderness.  Musculoskeletal:     Right lower leg: No edema.     Left lower leg: No edema.  Lymphadenopathy:     Cervical: No cervical adenopathy.     Right cervical: No superficial, deep or posterior cervical adenopathy.    Left cervical: No superficial, deep or posterior cervical adenopathy.     Upper Body:     Right upper body: No supraclavicular or axillary adenopathy.     Left upper body: No supraclavicular or axillary adenopathy.  Neurological:     General: No focal deficit present.     Mental Status: He is alert and oriented to person, place, and time.  Psychiatric:  Mood and Affect: Mood normal.        Behavior: Behavior normal.     LABS:      Latest Ref Rng & Units 07/11/2024    7:52 AM 06/19/2024    7:33 AM 05/22/2024   11:48 AM  CBC  WBC 4.0 - 10.5 K/uL 7.5  7.9  6.3    Hemoglobin 13.0 - 17.0 g/dL 87.9  87.7  88.4   Hematocrit 39.0 - 52.0 % 37.4  38.2  35.1   Platelets 150 - 400 K/uL 420  450  564       Latest Ref Rng & Units 07/11/2024    7:52 AM 06/19/2024    7:33 AM 05/22/2024   11:48 AM  CMP  Glucose 70 - 99 mg/dL 895  896  898   BUN 8 - 23 mg/dL 9  9  10    Creatinine 0.61 - 1.24 mg/dL 9.24  9.29  9.26   Sodium 135 - 145 mmol/L 135  134  135   Potassium 3.5 - 5.1 mmol/L 4.1  4.0  3.4   Chloride 98 - 111 mmol/L 101  100  99   CO2 22 - 32 mmol/L 26  26  26    Calcium 8.9 - 10.3 mg/dL 8.5  8.6  8.6   Total Protein 6.5 - 8.1 g/dL 7.9  8.0  7.6   Total Bilirubin 0.0 - 1.2 mg/dL 0.7  0.5  0.5   Alkaline Phos 38 - 126 U/L 147  153  153   AST 15 - 41 U/L 19  23  19    ALT 0 - 44 U/L 24  31  27       No results found for: CEA1, CEA / No results found for: CEA1, CEA No results found for: PSA1 No results found for: CAN199 No results found for: CAN125  No results found for: TOTALPROTELP, ALBUMINELP, A1GS, A2GS, BETS, BETA2SER, GAMS, MSPIKE, SPEI No results found for: TIBC, FERRITIN, IRONPCTSAT Lab Results  Component Value Date   LDH 197 (H) 04/12/2023     STUDIES:   NM PET Image Restag (PS) Skull Base To Thigh Result Date: 07/16/2024 CLINICAL DATA:  Subsequent treatment strategy for metastatic lung cancer restaging. EXAM: NUCLEAR MEDICINE PET SKULL BASE TO THIGH TECHNIQUE: 5.7 mCi F-18 FDG was injected intravenously. Full-ring PET imaging was performed from the skull base to thigh after the radiotracer. CT data was obtained and used for attenuation correction and anatomic localization. Fasting blood glucose: 106 mg/dl COMPARISON:  95/89/7974 FINDINGS: Mediastinal blood pool activity: SUV max 1.8 Liver activity: SUV max NA NECK: No areas of abnormal hypermetabolism. Incidental CT findings: Mucosal thickening of ethmoid air cells. CHEST: The right middle lobe hypermetabolic nodule measures 2.3 x 2.2 cm and a S.U.V. max  of 8.4 on 72/202. Compare 2.1 x 1.8 cm and a S.U.V. max of 6.6 on the prior. Right axillary nodes measure maximally 8 mm and a S.U.V. max of 4.6 on 45/202. These are new or increased since the prior. Mediastinal and right hilar hypermetabolism is increased. Example right hilum at a S.U.V. max of 3.8 today versus a S.U.V. max of 3.1 on the prior. Right-sided pleural thickening and multifocal hypermetabolism. Moderate loculated right-sided pleural effusion is increased. Incidental CT findings: Aortic and coronary artery calcification. Centrilobular emphysema. Volume loss in the dependent right lower lobe likely due to rounded atelectasis. ABDOMEN/PELVIS: Bilateral adrenal hypermetabolism is mild and without correlate dominant mass. No abdominopelvic nodal hypermetabolism. Incidental CT findings: Presumed complex cyst  involving the upper pole right kidney at 2.9 cm. Abdominal aortic atherosclerosis. SKELETON: New and progressive osseous metastasis. Example left clavicular head at a S.U.V. max of 8.2 today versus a S.U.V. max of 4.6 on the prior. Involving the posterior left third rib medially at a S.U.V. max of 3.8, new. Involving the posterior aspect of the C4 versus less likely C3 vertebral body at a S.U.V. max of 4.4-new. Incidental CT findings: Other sites of treated sclerotic osseous metastasis again identified. Examples throughout the right hemipelvis and acetabulum. IMPRESSION: 1. Mild disease progression. 2. Increased hypermetabolic right middle lobe nodule, right pleural, thoracic nodal, and osseous metastasis as detailed above. 3. Mild increase in loculated moderate right pleural effusion. 4. Incidental findings, including: Coronary artery atherosclerosis. Aortic Atherosclerosis (ICD10-I70.0). Emphysema (ICD10-J43.9). Electronically Signed   By: Rockey Kilts M.D.   On: 07/16/2024 15:05

## 2024-07-17 NOTE — Progress Notes (Signed)
Patient has been assessed, vital signs and labs have been reviewed by Dr. Katragadda. ANC, Creatinine, LFTs, and Platelets are within treatment parameters per Dr. Katragadda. The patient is good to proceed with treatment at this time. Primary RN and pharmacy aware.  

## 2024-07-17 NOTE — Telephone Encounter (Signed)
 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   MD would like to switch patient from afatinib  to osimertinib .  CMP from 07/11/24 assessed, no relevant lab abnormalities. Prescription dose and frequency assessed.   Current medication list in Epic reviewed, no DDIs with osimertinib  identifie:  Evaluated chart and no patient barriers to medication adherence identified.   Prescription has been e-scribed to the [facility] for benefits analysis and approval.  Oral Oncology Clinic will continue to follow for insurance authorization, copayment issues, initial counseling and start date.   Ayane Delancey N. Ilir Mahrt, PharmD, BCOP, CPP Hematology/Oncology Clinical Pharmacist [facility] Oral Chemotherapy Navigation Clinic [phone]  07/17/2024 2:14 PM

## 2024-07-18 ENCOUNTER — Other Ambulatory Visit (HOSPITAL_COMMUNITY): Payer: Self-pay

## 2024-07-18 ENCOUNTER — Encounter: Payer: Self-pay | Admitting: Hematology

## 2024-07-18 NOTE — Telephone Encounter (Signed)
 Oral Oncology Patient Advocate Encounter  Prior Authorization for Tagrisso  has been approved.    PA#  [id number] Effective dates: 07/17/2024 through 12/11/2024  Patients co-pay is $0.    [place] (Patty) Chet Burnet, CPhT  [facility], [name], [place] Oral Chemotherapy Patient Advocate Specialist III Phone: [phone]  Fax: [phone]

## 2024-07-19 ENCOUNTER — Telehealth: Payer: Self-pay | Admitting: Pharmacy Technician

## 2024-07-19 ENCOUNTER — Other Ambulatory Visit (HOSPITAL_COMMUNITY): Payer: Self-pay

## 2024-07-19 ENCOUNTER — Other Ambulatory Visit: Payer: Self-pay

## 2024-07-19 ENCOUNTER — Other Ambulatory Visit: Payer: Self-pay | Admitting: Pharmacy Technician

## 2024-07-19 NOTE — Telephone Encounter (Signed)
 Patient successfully OnBoarded and drug education provided by pharmacist. Medication scheduled to be shipped on 07/22/2024 for delivery on 07/23/2024 from [facility] to patient's address. Patient also knows to call me at [phone] with any questions or concerns regarding receiving medication or if there is any unexpected change in co-pay.   Theodore [name] (Patty) Chet Burnet, CPhT  [facility], [name], [facility] Oral Chemotherapy Patient Advocate Specialist III Phone: [phone]  Fax: [phone]

## 2024-07-19 NOTE — Progress Notes (Signed)
 Specialty Pharmacy Initial Fill Coordination Note  Theodore [name] is a 68 y.o. male contacted today regarding refills of specialty medication(s) Osimertinib  Mesylate (TAGRISSO ) .  Patient requested Delivery  on 07/23/24  to verified address [address]  [place] [address]   Medication will be filled on 08/11.   Patient is aware of $0 copayment.   Dianelys Scinto (Patty) Chet Burnet, CPhT  [facility], [name], [facility] Oral Chemotherapy Patient Advocate Specialist III Phone: [phone]  Fax: [phone]

## 2024-07-19 NOTE — Progress Notes (Signed)
 Patient education documented in EPIC note on 07/19/24.

## 2024-07-19 NOTE — Telephone Encounter (Signed)
 Clinical Pharmacist Practitioner Encounter   [facility] (Specialty[facility] will deliver medication to patient on 07/23/24.   Patient Education I spoke with patient for overview of new oral chemotherapy medication: Tagrisso  (osmiertinib) for the treatment of NSCLC, EGFR S786I and G719C mutation positive, planned duration until disease progression or unacceptable drug toxicity.   Counseled patient on administration, dosing, side effects, monitoring, drug-food interactions, safe handling, storage, and disposal. Patient will take 1 tablet (80 mg total) by mouth daily.  Side effects include but not limited to: diarrhea, decreased wbc/hgb/plt, rash, mouth sore. Diarrhea: patient knows to use loperamide as needed and call the office if they are having four or more loose stool per day    Reviewed with patient importance of keeping a medication schedule and plan for any missed doses.  After discussion with patient no patient barriers to medication adherence identified.   Distress evaluation: Distress thermometer not completed during telephone call as patient has been on previous lines of therapy.    Mr. Reason voiced understanding and appreciation. All questions answered. Medication handout provided.  Provided patient with Oral Chemotherapy Navigation Clinic phone number. Patient knows to call the office with questions or concerns. Oral Chemotherapy Navigation Clinic will continue to follow.  Emry Tobin N. Tea Collums, PharmD, BCOP, CPP Hematology/Oncology Clinical Pharmacist [facility] Oral Chemotherapy Navigation Clinic [phone]  07/19/2024 12:34 PM

## 2024-07-22 ENCOUNTER — Other Ambulatory Visit: Payer: Self-pay

## 2024-08-05 ENCOUNTER — Telehealth: Payer: Self-pay | Admitting: Oncology

## 2024-08-09 ENCOUNTER — Other Ambulatory Visit: Payer: Self-pay

## 2024-08-09 NOTE — Progress Notes (Signed)
 Specialty Pharmacy Refill Coordination Note  Theodore [name] is a 68 y.o. male contacted today regarding refills of specialty medication(s) Osimertinib  Mesylate (TAGRISSO )   Patient requested Delivery   Delivery date: 08/19/24   Verified address: [address]  [place] [address]   Medication will be filled on 08/16/24.

## 2024-08-09 NOTE — Progress Notes (Signed)
 Specialty Pharmacy Ongoing Clinical Assessment Note  Theodore [name] is a 68 y.o. male who is being followed by the specialty pharmacy service for RxSp Oncology   Patient's specialty medication(s) reviewed today: Osimertinib  Mesylate (TAGRISSO )   Missed doses in the last 4 weeks: 0   Patient/Caregiver did not have any additional questions or concerns.   Therapeutic benefit summary: Unable to assess   Adverse events/side effects summary: No adverse events/side effects   Patient's therapy is appropriate to: Continue    Goals Addressed             This Visit's Progress    Slow Disease Progression       Patient is unable to be assessed as therapy was recently initiated. Patient will maintain adherence         Follow up: 3 months  [facility] Specialty Pharmacist

## 2024-08-15 NOTE — Progress Notes (Addendum)
 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 [name] was transferred to my care after his prior physician has left.   ASSESSMENT & PLAN:   Assessment & Plan: Theodore [name]  is a 68 y.o. 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 S/p first-line treatment with afatinib  with recent progression 07/2024: Started on osimertinib  daily  - Patient tolerating osimertinib  80 mg daily with no complaints.  Denies rashes, fatigue.  Continue the current dose of osimertinib  - Reviewed PET scan from 06/2024.  Will repeat in 3 months that is 09/2024 - Reviewed MRI from 05/2024: Will repeat along with CT scan.  Return to clinic in 1 month with labs and follow-up Metastasis to bone [facility]) Received palliative radiation to right scapula and right hemipelvis  - Continue vitamin D and calcium supplementation -Continue monthly Zometa  Metastasis to brain [facility]) Brain metastasis responding very well to the treatment  - Continue to monitor with frequent MRIs every 3 months.  Next one due 08/2024 Normocytic anemia Patient has slight normocytic anemia and thrombocytosis  -Will obtain iron panel today and replace as needed    The patient understands the plans discussed today and is in agreement with them.  He knows to contact our office if he develops concerns prior to his next appointment.  100 minutes of total time was spent for this patient encounter, including preparation,review of records,  face-to-face counseling with the patient and coordination of care,  physical exam, and documentation of the encounter.   Theodore Dry, MD   [facility]ENTER [facility] [place] - A DEPT OF [facility] [address] [place] [address] Dept: [phone] Dept Fax: [phone]   Orders Placed This Encounter  Procedures   CBC with Differential/Platelet    Standing Status:   Future    Expected Date:   09/13/2024    Expiration Date:   12/12/2024   Comprehensive metabolic panel with GFR    Standing Status:   Future    Expected Date:   09/13/2024    Expiration Date:   12/12/2024     ONCOLOGY HISTORY:   I have reviewed his chart and materials related to his cancer extensively and collaborated history with the patient. 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 Chest xray consistent with right pleural effusion -03/17/2023: CT chest w contrast: Right middle lobe mass with associated complete collapse of the right middle lobe, concerning for primary lung malignancy. Moderate right pleural effusion. Nodularity of the right minor fissure, concerning for lymphatic spread of tumor. Lytic lesions of the right scapula and left 3rd and 4th ribs, highly concerning for osseous metastatic disease. Multiple enhancing liver lesions, concerning for metastatic disease. -03/30/2023: MRI brain: Many enhancing lesions in the brain, detailed above and compatible with metastatic disease. The dominant/largest lesions are in the left frontal lobe. -04/06/2023: PET:  Right middle lobe primary bronchogenic carcinoma with mediastinal nodal,  osseous, and probable upper abdominal nodal metastasis. Development of a right-sided pneumothorax since 03/16/2022 with persistent moderate right pleural effusion. Metastasis within the right acetabulum and proximal right femur may predispose the patient to fracture with  weight-bearing. Bilateral adrenal hypermetabolism with mild nodularity. Suspicious for early adrenal metastasis. -04/10/2024: Pleural fluid, cytology: Malignant cells consistent with adenocarcinoma  -Tumor cells are positive for  TTF-1 while they are negative for p63 and Napsin A  -04/11/2023: Guardant 360: EGFR G719C, EGFR S768 , TP53: positive. TMB: not evaluable, MSI-stable, PDL1: TPS <1%.  -EGFR T790M and others, ALK,ROS1,BRAF,MET,ERBB2(HER2),RET.NTRK,KRAS: Negative -04/18/2023: LUNG, RML, FNAC: Non small cell carcinoma. IHC positive for TTF-1 and negative for p40. -05/02/2023-05/05/2023: Received palliative radiation to right lung, right hip and scapula -05/18/2023-07/17/2024: Afatinib  30mg  daily. Dose increased to 40mg  daily on 07/06/2023 -08/30/2023- 05/17/2024: MRI brain: Continued response to treatment -08/17/2023: PET: Interval response to therapy with decreased size and metabolic activity of the central right middle lobe mass and associated right hilar and mediastinal adenopathy. -11/23/2023: PET: Stable disease -03/21/2024: EZU:Pwrmzjdpwh metabolic activity associated with peribronchial nodular thickening in the anterior RIGHT lung/RIGHT middle lobe. Findings concerning for local lung recurrence.  -07/11/2024: PET: Mild disease progression. Increased hypermetabolic right middle lobe nodule, right pleural, thoracic nodal, and osseous metastasis  -07/17/2024- Current: Osimertinib  80 mg daily    Current Treatment:  Osimertinib  80 mg daily  INTERVAL HISTORY:   Theodore [name] is here today for follow up and establish care with me.  He experiences intermittent pain of varying intensity. Previously, he was taking 800 mg of ibuprofen  once daily, which provided some relief, but he has run out of this medication.  No current diarrhea. The patient reports that he experienced diarrhea with a previous medication, but not with the current one. He reports a stable weight with a slight loss, and  although he has an appetite, it is not for large amounts of food. He drinks Ensure protein shakes between meals.  He experiences shortness of breath with activity, feels tired sometimes, and has occasional headaches. No skin rashes.  He quit smoking in 2009 and lives alone in [place], about a mile from the clinic. He manages his household tasks independently and cooks for himself, although his daughter, who lives across town, often cooks for him. Overall tolerating treatment well and has no complaints today.   I have reviewed the past medical history, past surgical history, social history and family history with the patient and they are unchanged from previous note.  ALLERGIES:  is allergic to vueway  [gadopiclenol ].  MEDICATIONS:  Current Outpatient Medications  Medication Sig Dispense Refill   albuterol  (PROVENTIL  HFA;VENTOLIN  HFA) 108 (90 Base) MCG/ACT inhaler Inhale 2 puffs into the lungs every 4 (four) hours as needed for wheezing or shortness of breath.     BREZTRI AEROSPHERE 160-9-4.8 MCG/ACT AERO SMARTSIG:2 Puff(s) By Mouth Twice Daily     clindamycin  (CLINDAGEL) 1 % gel APPLY TOPICALLY TO THE AFFECTED AREA TWICE DAILY 30 g 0   diphenoxylate -atropine  (LOMOTIL ) 2.5-0.025 MG tablet Take 1 tablet by mouth 4 (four) times daily as needed for diarrhea or loose stools. 120 tablet 3   folic acid  (FOLVITE ) 1 MG tablet TAKE 1 TABLET BY MOUTH EVERY DAY 90 tablet 3   HYDROCORTISONE , TOPICAL, 2.5 % SOLN Apply topically twice daily. 30 mL 0   ibuprofen  (ADVIL ) 800 MG tablet Take 1 tablet (800 mg total) by mouth every 12 (twelve) hours as needed. 30 tablet 0   LORazepam  (ATIVAN ) 0.5 MG tablet 1  tab po 30 minutes prior to radiation or MRI scans 30 tablet 0   osimertinib  mesylate (TAGRISSO ) 80 MG tablet Take 1 tablet (80 mg total) by mouth daily. 30 tablet 3   potassium chloride  SA (KLOR-CON  M) 20 MEQ tablet TAKE 1 TABLET(20 MEQ) BY MOUTH DAILY 90 tablet 3   No current facility-administered  medications for this visit.    REVIEW OF SYSTEMS:   Constitutional: Denies fevers, chills or abnormal weight loss Eyes: Denies blurriness of vision Ears, nose, mouth, throat, and face: Denies mucositis or sore throat Respiratory: Denies cough, dyspnea or wheezes Cardiovascular: Denies palpitation, chest discomfort or lower extremity swelling Gastrointestinal:  Denies nausea, heartburn or change in bowel habits Skin: Denies abnormal skin rashes Lymphatics: Denies new lymphadenopathy or easy bruising Neurological:Denies numbness, tingling or new weaknesses Behavioral/Psych: Mood is stable, no new changes  All other systems were reviewed with the patient and are negative.   VITALS:  Blood pressure 114/77, pulse (!) 103, temperature (!) 97.3 F (36.3 C), temperature source Oral, resp. rate 16, weight 104 lb 6.4 oz (47.4 kg), SpO2 98%.  Wt Readings from Last 3 Encounters:  08/16/24 104 lb 6.4 oz (47.4 kg)  07/17/24 108 lb 0.4 oz (49 kg)  06/19/24 106 lb (48.1 kg)    Body mass index is 15.42 kg/m.  Performance status (ECOG): 1 - Symptomatic but completely ambulatory  PHYSICAL EXAM:   GENERAL:alert, no distress and comfortable SKIN: skin color, texture, turgor are normal, no rashes or significant lesions LUNGS: clear to auscultation and percussion with normal breathing effort HEART: regular rate & rhythm and no murmurs and no lower extremity edema ABDOMEN:abdomen soft, non-tender and normal bowel sounds Musculoskeletal:no cyanosis of digits and no clubbing  NEURO: alert & oriented x 3 with fluent speech, no focal motor/sensory deficits  LABORATORY DATA:  I have reviewed the data as listed   Lab Results  Component Value Date   WBC 6.4 08/16/2024   NEUTROABS 3.6 08/16/2024   HGB 11.9 (L) 08/16/2024   HCT 37.2 (L) 08/16/2024   MCV 79.5 (L) 08/16/2024   PLT 425 (H) 08/16/2024      Chemistry      Component Value Date/Time   NA 134 (L) 08/16/2024 0943   K 4.1 08/16/2024  0943   CL 100 08/16/2024 0943   CO2 24 08/16/2024 0943   BUN 12 08/16/2024 0943   CREATININE 0.73 08/16/2024 0943      Component Value Date/Time   CALCIUM 8.6 (L) 08/16/2024 0943   ALKPHOS 168 (H) 08/16/2024 0943   AST 20 08/16/2024 0943   ALT 25 08/16/2024 0943   BILITOT 0.5 08/16/2024 0943        RADIOGRAPHIC STUDIES: I have personally reviewed the radiological images as listed and agreed with the findings in the report.  NM PET Image Restag (PS) Skull Base To Thigh CLINICAL DATA:  Subsequent treatment strategy for metastatic lung cancer restaging.  EXAM: NUCLEAR MEDICINE PET SKULL BASE TO THIGH  TECHNIQUE: 5.7 mCi F-18 FDG was injected intravenously. Full-ring PET imaging was performed from the skull base to thigh after the radiotracer. CT data was obtained and used for attenuation correction and anatomic localization.  Fasting blood glucose: 106 mg/dl  COMPARISON:  95/89/7974  FINDINGS: Mediastinal blood pool activity: SUV max 1.8  Liver activity: SUV max NA  NECK: No areas of abnormal hypermetabolism.  Incidental CT findings: Mucosal thickening of ethmoid air cells.  CHEST: The right middle lobe hypermetabolic nodule measures 2.3 x 2.2 cm  and a S.U.V. max of 8.4 on 72/202. Compare 2.1 x 1.8 cm and a S.U.V. max of 6.6 on the prior.  Right axillary nodes measure maximally 8 mm and a S.U.V. max of 4.6 on 45/202. These are new or increased since the prior.  Mediastinal and right hilar hypermetabolism is increased. Example right hilum at a S.U.V. max of 3.8 today versus a S.U.V. max of 3.1 on the prior.  Right-sided pleural thickening and multifocal hypermetabolism. Moderate loculated right-sided pleural effusion is increased.  Incidental CT findings: Aortic and coronary artery calcification. Centrilobular emphysema. Volume loss in the dependent right lower lobe likely due to rounded atelectasis.  ABDOMEN/PELVIS: Bilateral adrenal hypermetabolism is mild  and without correlate dominant mass. No abdominopelvic nodal hypermetabolism.  Incidental CT findings: Presumed complex cyst involving the upper pole right kidney at 2.9 cm. Abdominal aortic atherosclerosis.  SKELETON: New and progressive osseous metastasis. Example left clavicular head at a S.U.V. max of 8.2 today versus a S.U.V. max of 4.6 on the prior.  Involving the posterior left third rib medially at a S.U.V. max of 3.8, new.  Involving the posterior aspect of the C4 versus less likely C3 vertebral body at a S.U.V. max of 4.4-new.  Incidental CT findings: Other sites of treated sclerotic osseous metastasis again identified. Examples throughout the right hemipelvis and acetabulum.  IMPRESSION: 1. Mild disease progression. 2. Increased hypermetabolic right middle lobe nodule, right pleural, thoracic nodal, and osseous metastasis as detailed above. 3. Mild increase in loculated moderate right pleural effusion. 4. Incidental findings, including: Coronary artery atherosclerosis. Aortic Atherosclerosis (ICD10-I70.0). Emphysema (ICD10-J43.9).  Electronically Signed   By: Rockey Kilts M.D.   On: 07/16/2024 15:05

## 2024-08-16 ENCOUNTER — Inpatient Hospital Stay: Attending: Hematology

## 2024-08-16 ENCOUNTER — Other Ambulatory Visit: Payer: Self-pay

## 2024-08-16 ENCOUNTER — Inpatient Hospital Stay

## 2024-08-16 ENCOUNTER — Inpatient Hospital Stay (HOSPITAL_BASED_OUTPATIENT_CLINIC_OR_DEPARTMENT_OTHER): Admitting: Oncology

## 2024-08-16 VITALS — BP 114/77 | HR 103 | Temp 97.3°F | Resp 16 | Wt 104.4 lb

## 2024-08-16 DIAGNOSIS — C342 Malignant neoplasm of middle lobe, bronchus or lung: Secondary | ICD-10-CM | POA: Insufficient documentation

## 2024-08-16 DIAGNOSIS — C7931 Secondary malignant neoplasm of brain: Secondary | ICD-10-CM

## 2024-08-16 DIAGNOSIS — D649 Anemia, unspecified: Secondary | ICD-10-CM | POA: Diagnosis not present

## 2024-08-16 DIAGNOSIS — Z7983 Long term (current) use of bisphosphonates: Secondary | ICD-10-CM | POA: Diagnosis not present

## 2024-08-16 DIAGNOSIS — C7951 Secondary malignant neoplasm of bone: Secondary | ICD-10-CM

## 2024-08-16 DIAGNOSIS — C3491 Malignant neoplasm of unspecified part of right bronchus or lung: Secondary | ICD-10-CM | POA: Diagnosis not present

## 2024-08-16 DIAGNOSIS — C787 Secondary malignant neoplasm of liver and intrahepatic bile duct: Secondary | ICD-10-CM | POA: Diagnosis not present

## 2024-08-16 DIAGNOSIS — Z79899 Other long term (current) drug therapy: Secondary | ICD-10-CM | POA: Diagnosis not present

## 2024-08-16 DIAGNOSIS — Z87891 Personal history of nicotine dependence: Secondary | ICD-10-CM | POA: Diagnosis not present

## 2024-08-16 DIAGNOSIS — C7970 Secondary malignant neoplasm of unspecified adrenal gland: Secondary | ICD-10-CM | POA: Insufficient documentation

## 2024-08-16 LAB — CBC WITH DIFFERENTIAL/PLATELET
Abs Immature Granulocytes: 0.01 K/uL (ref 0.00–0.07)
Basophils Absolute: 0 K/uL (ref 0.0–0.1)
Basophils Relative: 1 %
Eosinophils Absolute: 0.3 K/uL (ref 0.0–0.5)
Eosinophils Relative: 5 %
HCT: 37.2 % — ABNORMAL LOW (ref 39.0–52.0)
Hemoglobin: 11.9 g/dL — ABNORMAL LOW (ref 13.0–17.0)
Immature Granulocytes: 0 %
Lymphocytes Relative: 24 %
Lymphs Abs: 1.5 K/uL (ref 0.7–4.0)
MCH: 25.4 pg — ABNORMAL LOW (ref 26.0–34.0)
MCHC: 32 g/dL (ref 30.0–36.0)
MCV: 79.5 fL — ABNORMAL LOW (ref 80.0–100.0)
Monocytes Absolute: 0.9 K/uL (ref 0.1–1.0)
Monocytes Relative: 14 %
Neutro Abs: 3.6 K/uL (ref 1.7–7.7)
Neutrophils Relative %: 56 %
Platelets: 425 K/uL — ABNORMAL HIGH (ref 150–400)
RBC: 4.68 MIL/uL (ref 4.22–5.81)
RDW: 19.3 % — ABNORMAL HIGH (ref 11.5–15.5)
WBC: 6.4 K/uL (ref 4.0–10.5)
nRBC: 0 % (ref 0.0–0.2)

## 2024-08-16 LAB — COMPREHENSIVE METABOLIC PANEL WITH GFR
ALT: 25 U/L (ref 0–44)
AST: 20 U/L (ref 15–41)
Albumin: 3.4 g/dL — ABNORMAL LOW (ref 3.5–5.0)
Alkaline Phosphatase: 168 U/L — ABNORMAL HIGH (ref 38–126)
Anion gap: 10 (ref 5–15)
BUN: 12 mg/dL (ref 8–23)
CO2: 24 mmol/L (ref 22–32)
Calcium: 8.6 mg/dL — ABNORMAL LOW (ref 8.9–10.3)
Chloride: 100 mmol/L (ref 98–111)
Creatinine, Ser: 0.73 mg/dL (ref 0.61–1.24)
GFR, Estimated: >60 mL/min (ref >60)
Glucose, Bld: 94 mg/dL (ref 70–99)
Potassium: 4.1 mmol/L (ref 3.5–5.1)
Sodium: 134 mmol/L — ABNORMAL LOW (ref 135–145)
Total Bilirubin: 0.5 mg/dL (ref 0.0–1.2)
Total Protein: 8.1 g/dL (ref 6.5–8.1)

## 2024-08-16 LAB — MAGNESIUM: Magnesium: 2.2 mg/dL (ref 1.7–2.4)

## 2024-08-16 LAB — IRON AND TIBC
Iron: 23 ug/dL — ABNORMAL LOW (ref 45–182)
Saturation Ratios: 10 % — ABNORMAL LOW (ref 17.9–39.5)
TIBC: 232 ug/dL — ABNORMAL LOW (ref 250–450)
UIBC: 209 ug/dL

## 2024-08-16 LAB — FERRITIN: Ferritin: 264 ng/mL (ref 24–336)

## 2024-08-16 MED ORDER — SODIUM CHLORIDE 0.9 % IV SOLN: Freq: Once | INTRAVENOUS | Status: AC

## 2024-08-16 MED ORDER — ZOLEDRONIC ACID 4 MG/100ML IV SOLN
4.0000 mg | Freq: Once | INTRAVENOUS | Status: AC
  Administered 2024-08-16: 4 mg via INTRAVENOUS
  Filled 2024-08-16: qty 100

## 2024-08-16 NOTE — Progress Notes (Incomplete)
 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 male was transferred to my care after his prior physician has left.   ASSESSMENT & PLAN:   Assessment & Plan: Theodore [name]  is a 68 y.o. 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  He knows to contact our office if he develops concerns prior to his next appointment.  *** minutes of total time was spent for this patient encounter, including preparation,review of records,  face-to-face counseling with the patient and coordination of care, physical exam, and documentation of the encounter.   Mickiel Dry, MD  [facility]  - A DEPT OF [facility] [address] [place] [address] Dept: [phone] Dept Fax: [phone]   No orders of the defined types were placed in this encounter.    ONCOLOGY HISTORY:   I have reviewed his chart and materials related to his cancer extensively and collaborated history with the patient. 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 Chest xray consistent with right pleural effusion -03/17/2023: CT chest w contrast: Right middle lobe mass with associated complete collapse of the right middle lobe, concerning for primary lung malignancy. Moderate right pleural effusion. Nodularity of the right minor fissure, concerning for lymphatic spread of tumor. Lytic lesions of the right scapula and left 3rd and 4th ribs, highly concerning for osseous metastatic  disease. Multiple enhancing liver lesions, concerning for metastatic disease. -03/30/2023: MRI brain: Many enhancing lesions in the brain, detailed above and compatible with metastatic disease. The dominant/largest lesions are in the left frontal lobe. -04/06/2023: PET:  Right middle lobe primary bronchogenic carcinoma with mediastinal nodal, osseous, and probable upper abdominal nodal metastasis. Development of a right-sided pneumothorax since 03/16/2022 with persistent moderate right pleural effusion. Metastasis within the right acetabulum and proximal right femur may predispose the patient to fracture with weight-bearing. Bilateral adrenal hypermetabolism with mild nodularity. Suspicious for early adrenal metastasis. -04/10/2024: Pleural fluid, cytology: Malignant cells consistent with adenocarcinoma  -Tumor cells are positive for  TTF-1 while they are negative for p63 and Napsin A  -04/11/2023: Guardant 360: EGFR G719C, EGFR S768 , TP53: positive. TMB: not evaluable, MSI-stable, PDL1: TPS <1%.  -EGFR T790M and others, ALK,ROS1,BRAF,MET,ERBB2(HER2),RET.NTRK,KRAS: Negative -04/18/2023: LUNG, RML, FNAC: Non small cell carcinoma. IHC positive for TTF-1 and negative for p40. -05/02/2023-05/05/2023: Received palliative radiation to right lung, right hip and scapula -05/18/2023-07/17/2024: Afatinib  30mg  daily. Dose increased to 40mg  daily on 07/06/2023 -08/30/2023- 05/17/2024: MRI brain: Continued response to treatment -08/17/2023: PET: Interval response to therapy with decreased size and metabolic activity of the central right middle lobe mass and associated right hilar and mediastinal adenopathy. -11/23/2023: PET: Stable disease -03/21/2024: EZU:Pwrmzjdpwh metabolic activity associated with peribronchial nodular thickening in the anterior RIGHT lung/RIGHT middle lobe. Findings concerning for local lung recurrence.  -07/11/2024: PET: Mild disease progression. Increased hypermetabolic right middle lobe  nodule, right pleural, thoracic nodal, and osseous metastasis  -07/17/2024- Current: Osimertinib  80 mg daily  Current Treatment:  Osimertinib  80 mg daily  INTERVAL HISTORY:   Theodore [name] is here today for follow up and establish care with me.  He experiences intermittent pain of varying intensity. Previously, he was taking 800 mg of ibuprofen  once daily, which provided some relief, but he has run out of this medication.  No current diarrhea. The patient reports that he experienced diarrhea with a previous medication, but not with the current one. He reports a stable weight with a slight loss, and although he has an appetite, it is not for large amounts of food. He drinks Ensure protein shakes between meals.  He experiences shortness of breath with activity, feels tired sometimes, and has occasional headaches. No skin rashes.  He quit smoking in 2009 and lives alone in [place], about a mile from the clinic. He manages his household tasks independently and cooks for himself, although his daughter, who lives across town, often cooks for him. Overall tolerating treatment well and has no complaints today.   I have reviewed the past medical history, past surgical history, social history and family history with the patient and they are unchanged from previous note.  ALLERGIES:  is allergic to vueway  [gadopiclenol ].  MEDICATIONS:  Current Outpatient Medications  Medication Sig Dispense Refill  . acetaminophen  (TYLENOL ) 325 MG tablet Take 2 tablets (650 mg total) by mouth every 6 (six) hours as needed for mild pain, moderate pain, headache or fever (or Fever >/= 101).    . albuterol  (PROVENTIL  HFA;VENTOLIN  HFA) 108 (90 Base) MCG/ACT inhaler Inhale 2 puffs into the lungs every 4 (four) hours as needed for wheezing or shortness of breath.    SABRA BREZTRI AEROSPHERE 160-9-4.8 MCG/ACT AERO SMARTSIG:2 Puff(s) By Mouth Twice Daily    . clindamycin  (CLINDAGEL) 1 % gel APPLY TOPICALLY TO THE  AFFECTED AREA TWICE DAILY 30 g 0  . diphenoxylate -atropine  (LOMOTIL ) 2.5-0.025 MG tablet Take 1 tablet by mouth 4 (four) times daily as needed for diarrhea or loose stools. 120 tablet 3  . folic acid  (FOLVITE ) 1 MG tablet TAKE 1 TABLET BY MOUTH EVERY DAY 90 tablet 3  . HYDROCORTISONE , TOPICAL, 2.5 % SOLN Apply topically twice daily. 30 mL 0  . ibuprofen  (ADVIL ) 200 MG tablet Take 400 mg by mouth every 6 (six) hours as needed for moderate pain.    . ibuprofen  (ADVIL ) 800 MG tablet Take 1 tablet (800 mg total) by mouth every 12 (twelve) hours as needed. 30 tablet 0  . LORazepam  (ATIVAN ) 0.5 MG tablet 1 tab po 30 minutes prior to radiation or MRI scans 30 tablet 0  . osimertinib  mesylate (TAGRISSO ) 80 MG tablet Take 1 tablet (80 mg total) by mouth daily. 30 tablet 3  . potassium chloride  SA (KLOR-CON  M) 20 MEQ tablet TAKE 1 TABLET(20 MEQ) BY MOUTH DAILY 90 tablet 3   No current facility-administered medications for this visit.    REVIEW OF SYSTEMS:   Constitutional: Denies fevers, chills or abnormal weight loss Eyes: Denies blurriness of vision Ears, nose, mouth, throat, and face: Denies mucositis or sore throat Respiratory: Denies cough, dyspnea or wheezes Cardiovascular: Denies palpitation, chest discomfort or lower extremity swelling Gastrointestinal:  Denies nausea, heartburn or change in bowel habits Skin: Denies abnormal skin rashes Lymphatics: Denies new lymphadenopathy or easy bruising Neurological:Denies numbness, tingling or new weaknesses Behavioral/Psych: Mood is stable, no new changes  All other systems were reviewed with the patient and are negative.   VITALS:  There were no vitals taken for this visit.  Wt Readings from Last 3 Encounters:  07/17/24 108 lb 0.4 oz (49 kg)  06/19/24 106 lb (48.1 kg)  05/22/24 104 lb 8 oz (47.4 kg)    There is no height or weight on file to calculate BMI.  Performance status (ECOG): 1 - Symptomatic but completely ambulatory  PHYSICAL  EXAM:   GENERAL:alert, no distress and comfortable SKIN: skin color, texture, turgor are normal, no rashes or significant lesions LUNGS: clear to auscultation and percussion with normal breathing effort HEART: regular rate & rhythm and no murmurs and no lower extremity edema ABDOMEN:abdomen soft, non-tender and normal bowel sounds Musculoskeletal:no cyanosis of digits and no clubbing  NEURO: alert & oriented x 3 with fluent speech, no focal motor/sensory deficits  LABORATORY DATA:  I have reviewed the data as listed   Lab Results  Component Value Date   WBC 7.5 07/11/2024   NEUTROABS 4.3 07/11/2024   HGB 12.0 (L) 07/11/2024   HCT 37.4 (L) 07/11/2024   MCV 82.6 07/11/2024   PLT 420 (H) 07/11/2024      Chemistry      Component Value Date/Time   NA 135 07/11/2024 0752   K 4.1 07/11/2024 0752   CL 101 07/11/2024 0752   CO2 26 07/11/2024 0752   BUN 9 07/11/2024 0752   CREATININE 0.75 07/11/2024 0752      Component Value Date/Time   CALCIUM 8.5 (L) 07/11/2024 0752   ALKPHOS 147 (H) 07/11/2024 0752   AST 19 07/11/2024 0752   ALT 24 07/11/2024 0752   BILITOT 0.7 07/11/2024 0752        RADIOGRAPHIC STUDIES: I have personally reviewed the radiological images as listed and agreed with the findings in the report.  NM PET Image Restag (PS) Skull Base To Thigh CLINICAL DATA:  Subsequent treatment strategy for metastatic lung cancer restaging.  EXAM: NUCLEAR MEDICINE PET SKULL BASE TO THIGH  TECHNIQUE: 5.7 mCi F-18 FDG was injected intravenously. Full-ring PET imaging was performed from the skull base to thigh after the radiotracer. CT data was obtained and used for attenuation correction and anatomic localization.  Fasting blood glucose: 106 mg/dl  COMPARISON:  95/89/7974  FINDINGS: Mediastinal blood pool activity: SUV max 1.8  Liver activity: SUV max NA  NECK: No areas of abnormal hypermetabolism.  Incidental CT findings: Mucosal thickening of ethmoid air  cells.  CHEST: The right middle lobe hypermetabolic nodule measures 2.3 x 2.2 cm and a S.U.V. max of 8.4 on 72/202. Compare 2.1 x 1.8 cm and a S.U.V. max of 6.6 on the prior.  Right axillary nodes measure maximally 8 mm and a S.U.V. max of 4.6 on 45/202. These are new or increased since the prior.  Mediastinal and right hilar hypermetabolism is increased. Example right hilum at a S.U.V. max of 3.8 today versus a S.U.V. max of 3.1 on the prior.  Right-sided pleural thickening and multifocal hypermetabolism. Moderate loculated right-sided pleural effusion is increased.  Incidental CT findings: Aortic and coronary artery calcification. Centrilobular emphysema. Volume loss in the dependent right lower lobe likely due to rounded atelectasis.  ABDOMEN/PELVIS: Bilateral adrenal hypermetabolism is mild and without correlate dominant mass. No abdominopelvic nodal hypermetabolism.  Incidental CT findings: Presumed complex cyst involving the upper pole right kidney at 2.9 cm. Abdominal aortic atherosclerosis.  SKELETON: New and progressive osseous metastasis. Example left clavicular head at a S.U.V. max of 8.2 today versus a S.U.V. max of 4.6 on the prior.  Involving the posterior left third rib medially at a S.U.V. max  of 3.8, new.  Involving the posterior aspect of the C4 versus less likely C3 vertebral body at a S.U.V. max of 4.4-new.  Incidental CT findings: Other sites of treated sclerotic osseous metastasis again identified. Examples throughout the right hemipelvis and acetabulum.  IMPRESSION: 1. Mild disease progression. 2. Increased hypermetabolic right middle lobe nodule, right pleural, thoracic nodal, and osseous metastasis as detailed above. 3. Mild increase in loculated moderate right pleural effusion. 4. Incidental findings, including: Coronary artery atherosclerosis. Aortic Atherosclerosis (ICD10-I70.0). Emphysema (ICD10-J43.9).  Electronically Signed   By: Rockey Kilts M.D.   On: 07/16/2024 15:05

## 2024-08-16 NOTE — Patient Instructions (Signed)

## 2024-08-16 NOTE — Progress Notes (Signed)
 Patient tolerated iron infusion with no complaints voiced.  Peripheral IV site clean and dry with good blood return noted before and after infusion.  Band aid applied.  VSS with discharge and left in satisfactory condition with no s/s of distress noted.

## 2024-08-17 ENCOUNTER — Encounter: Payer: Self-pay | Admitting: Oncology

## 2024-08-17 DIAGNOSIS — C7931 Secondary malignant neoplasm of brain: Secondary | ICD-10-CM | POA: Insufficient documentation

## 2024-08-17 DIAGNOSIS — D649 Anemia, unspecified: Secondary | ICD-10-CM | POA: Insufficient documentation

## 2024-08-17 NOTE — Assessment & Plan Note (Addendum)
 Brain metastasis responding very well to the treatment  - Continue to monitor with frequent MRIs every 3 months.  Next one due 08/2024

## 2024-08-17 NOTE — Assessment & Plan Note (Signed)
 Patient has slight normocytic anemia and thrombocytosis  -Will obtain iron panel today and replace as needed

## 2024-08-17 NOTE — Assessment & Plan Note (Addendum)
 Received palliative radiation to right scapula and right hemipelvis  - Continue vitamin D and calcium supplementation -Continue monthly Zometa 

## 2024-08-17 NOTE — Assessment & Plan Note (Addendum)
 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 S/p first-line treatment with afatinib  with recent progression 07/2024: Started on osimertinib  daily  - Patient tolerating osimertinib  80 mg daily with no complaints.  Denies rashes, fatigue.  Continue the current dose of osimertinib  - Reviewed PET scan from 06/2024.  Will repeat in 3 months that is 09/2024 - Reviewed MRI from 05/2024: Will repeat along with CT scan.  Return to clinic in1 month with labs and follow-up

## 2024-08-17 NOTE — Addendum Note (Signed)
 Addended by: Keelee Yankey on: 08/17/2024 12:06 AM   Modules accepted: Orders

## 2024-08-19 ENCOUNTER — Other Ambulatory Visit: Payer: Self-pay | Admitting: Oncology

## 2024-08-21 ENCOUNTER — Inpatient Hospital Stay

## 2024-08-21 VITALS — BP 127/81 | HR 78 | Temp 97.2°F | Resp 18

## 2024-08-21 DIAGNOSIS — C7951 Secondary malignant neoplasm of bone: Secondary | ICD-10-CM | POA: Diagnosis not present

## 2024-08-21 DIAGNOSIS — Z7983 Long term (current) use of bisphosphonates: Secondary | ICD-10-CM | POA: Diagnosis not present

## 2024-08-21 DIAGNOSIS — C342 Malignant neoplasm of middle lobe, bronchus or lung: Secondary | ICD-10-CM | POA: Diagnosis not present

## 2024-08-21 DIAGNOSIS — C7931 Secondary malignant neoplasm of brain: Secondary | ICD-10-CM | POA: Diagnosis not present

## 2024-08-21 DIAGNOSIS — Z79899 Other long term (current) drug therapy: Secondary | ICD-10-CM | POA: Diagnosis not present

## 2024-08-21 DIAGNOSIS — D649 Anemia, unspecified: Secondary | ICD-10-CM | POA: Diagnosis not present

## 2024-08-21 DIAGNOSIS — C787 Secondary malignant neoplasm of liver and intrahepatic bile duct: Secondary | ICD-10-CM | POA: Diagnosis not present

## 2024-08-21 DIAGNOSIS — Z87891 Personal history of nicotine dependence: Secondary | ICD-10-CM | POA: Diagnosis not present

## 2024-08-21 DIAGNOSIS — C7970 Secondary malignant neoplasm of unspecified adrenal gland: Secondary | ICD-10-CM | POA: Diagnosis not present

## 2024-08-21 MED ORDER — ACETAMINOPHEN 325 MG PO TABS
650.0000 mg | ORAL_TABLET | Freq: Once | ORAL | Status: AC
  Administered 2024-08-21: 650 mg via ORAL
  Filled 2024-08-21: qty 2

## 2024-08-21 MED ORDER — SODIUM CHLORIDE 0.9 % IV SOLN
510.0000 mg | Freq: Once | INTRAVENOUS | Status: AC
  Administered 2024-08-21: 510 mg via INTRAVENOUS
  Filled 2024-08-21: qty 510

## 2024-08-21 MED ORDER — CETIRIZINE HCL 10 MG PO TABS
10.0000 mg | ORAL_TABLET | Freq: Once | ORAL | Status: AC
  Administered 2024-08-21: 10 mg via ORAL
  Filled 2024-08-21: qty 1

## 2024-08-21 MED ORDER — SODIUM CHLORIDE 0.9 % IV SOLN: INTRAVENOUS | Status: DC

## 2024-08-21 NOTE — Progress Notes (Signed)
 Patient tolerated iron infusion with no complaints voiced.  Peripheral IV site clean and dry with good blood return noted before and after infusion.  Band aid applied.  VSS with discharge and left in satisfactory condition with no s/s of distress noted.

## 2024-08-21 NOTE — Patient Instructions (Signed)
 [facility] - A DEPT OF [facility]  Discharge Instructions: Thank you for choosing [facility] to provide your oncology and hematology care.  If you have a lab appointment with the Cancer Center - please note that after April 8th, 2024, all labs will be drawn in the cancer center.  You do not have to check in or register with the main entrance as you have in the past but will complete your check-in in the cancer center.  Wear comfortable clothing and clothing appropriate for easy access to any Portacath or PICC line.   We strive to give you quality time with your provider. You may need to reschedule your appointment if you arrive late (15 or more minutes).  Arriving late affects you and other patients whose appointments are after yours.  Also, if you miss three or more appointments without notifying the office, you may be dismissed from the clinic at the provider's discretion.      For prescription refill requests, have your pharmacy contact our office and allow 72 hours for refills to be completed.    Today you received the following Venofer , return as scheduled.   To help prevent nausea and vomiting after your treatment, we encourage you to take your nausea medication as directed.  BELOW ARE SYMPTOMS THAT SHOULD BE REPORTED IMMEDIATELY: *FEVER GREATER THAN 100.4 F (38 C) OR HIGHER *CHILLS OR SWEATING *NAUSEA AND VOMITING THAT IS NOT CONTROLLED WITH YOUR NAUSEA MEDICATION *UNUSUAL SHORTNESS OF BREATH *UNUSUAL BRUISING OR BLEEDING *URINARY PROBLEMS (pain or burning when urinating, or frequent urination) *BOWEL PROBLEMS (unusual diarrhea, constipation, pain near the anus) TENDERNESS IN MOUTH AND THROAT WITH OR WITHOUT PRESENCE OF ULCERS (sore throat, sores in mouth, or a toothache) UNUSUAL RASH, SWELLING OR PAIN  UNUSUAL VAGINAL DISCHARGE OR ITCHING   Items with * indicate a potential emergency and should be followed up as soon as possible or  go to the Emergency Department if any problems should occur.  Please show the CHEMOTHERAPY ALERT CARD or IMMUNOTHERAPY ALERT CARD at check-in to the Emergency Department and triage nurse.  Should you have questions after your visit or need to cancel or reschedule your appointment, please contact [facility] [place] - A DEPT OF [facility] [phone]  and follow the prompts.  Office hours are 8:00 a.m. to 4:30 p.m. Monday - Friday. Please note that voicemails left after 4:00 p.m. may not be returned until the following business day.  We are closed weekends and major holidays. You have access to a nurse at all times for urgent questions. Please call the main number to the clinic [phone] and follow the prompts.  For any non-urgent questions, you may also contact your provider using MyChart. We now offer e-Visits for anyone 59 and older to request care online for non-urgent symptoms. For details visit [email or web address].   Also download the MyChart app! Go to the app store, search MyChart, open the app, select [facility], and log in with your MyChart username and password.

## 2024-08-28 ENCOUNTER — Inpatient Hospital Stay

## 2024-08-28 VITALS — BP 110/72 | HR 92 | Temp 97.3°F | Resp 18

## 2024-08-28 DIAGNOSIS — C7951 Secondary malignant neoplasm of bone: Secondary | ICD-10-CM | POA: Diagnosis not present

## 2024-08-28 DIAGNOSIS — C7931 Secondary malignant neoplasm of brain: Secondary | ICD-10-CM | POA: Diagnosis not present

## 2024-08-28 DIAGNOSIS — C787 Secondary malignant neoplasm of liver and intrahepatic bile duct: Secondary | ICD-10-CM | POA: Diagnosis not present

## 2024-08-28 DIAGNOSIS — C342 Malignant neoplasm of middle lobe, bronchus or lung: Secondary | ICD-10-CM | POA: Diagnosis not present

## 2024-08-28 DIAGNOSIS — Z7983 Long term (current) use of bisphosphonates: Secondary | ICD-10-CM | POA: Diagnosis not present

## 2024-08-28 DIAGNOSIS — Z87891 Personal history of nicotine dependence: Secondary | ICD-10-CM | POA: Diagnosis not present

## 2024-08-28 DIAGNOSIS — D649 Anemia, unspecified: Secondary | ICD-10-CM | POA: Diagnosis not present

## 2024-08-28 DIAGNOSIS — C7970 Secondary malignant neoplasm of unspecified adrenal gland: Secondary | ICD-10-CM | POA: Diagnosis not present

## 2024-08-28 DIAGNOSIS — Z79899 Other long term (current) drug therapy: Secondary | ICD-10-CM | POA: Diagnosis not present

## 2024-08-28 MED ORDER — SODIUM CHLORIDE 0.9 % IV SOLN: INTRAVENOUS | Status: DC

## 2024-08-28 MED ORDER — ACETAMINOPHEN 325 MG PO TABS
650.0000 mg | ORAL_TABLET | Freq: Once | ORAL | Status: AC
  Administered 2024-08-28: 650 mg via ORAL
  Filled 2024-08-28: qty 2

## 2024-08-28 MED ORDER — SODIUM CHLORIDE 0.9 % IV SOLN
510.0000 mg | Freq: Once | INTRAVENOUS | Status: AC
  Administered 2024-08-28: 510 mg via INTRAVENOUS
  Filled 2024-08-28: qty 510

## 2024-08-28 MED ORDER — CETIRIZINE HCL 10 MG PO TABS
10.0000 mg | ORAL_TABLET | Freq: Once | ORAL | Status: AC
  Administered 2024-08-28: 10 mg via ORAL
  Filled 2024-08-28: qty 1

## 2024-08-28 NOTE — Patient Instructions (Signed)

## 2024-08-28 NOTE — Progress Notes (Signed)
 Patient presents today for iron infusion.  Patient is in satisfactory condition with no new complaints voiced.  Vital signs are stable.  We will proceed with infusion per provider orders.    Peripheral IV started with good blood return pre and post infusion.  Feraheme 510 mg given today per MD orders. Tolerated infusion without adverse affects. Vital signs stable. No complaints at this time. Discharged from clinic via wheelchair in stable condition. Alert and oriented x 3. F/U with [facility] as scheduled.

## 2024-08-29 ENCOUNTER — Other Ambulatory Visit: Payer: Self-pay | Admitting: Radiation Oncology

## 2024-08-29 DIAGNOSIS — C3491 Malignant neoplasm of unspecified part of right bronchus or lung: Secondary | ICD-10-CM

## 2024-08-29 DIAGNOSIS — C7931 Secondary malignant neoplasm of brain: Secondary | ICD-10-CM

## 2024-09-05 ENCOUNTER — Other Ambulatory Visit (HOSPITAL_COMMUNITY): Payer: Self-pay

## 2024-09-09 ENCOUNTER — Inpatient Hospital Stay

## 2024-09-09 DIAGNOSIS — C787 Secondary malignant neoplasm of liver and intrahepatic bile duct: Secondary | ICD-10-CM | POA: Diagnosis not present

## 2024-09-09 DIAGNOSIS — C3491 Malignant neoplasm of unspecified part of right bronchus or lung: Secondary | ICD-10-CM

## 2024-09-09 DIAGNOSIS — C7931 Secondary malignant neoplasm of brain: Secondary | ICD-10-CM | POA: Diagnosis not present

## 2024-09-09 DIAGNOSIS — Z79899 Other long term (current) drug therapy: Secondary | ICD-10-CM | POA: Diagnosis not present

## 2024-09-09 DIAGNOSIS — C342 Malignant neoplasm of middle lobe, bronchus or lung: Secondary | ICD-10-CM | POA: Diagnosis not present

## 2024-09-09 DIAGNOSIS — C7970 Secondary malignant neoplasm of unspecified adrenal gland: Secondary | ICD-10-CM | POA: Diagnosis not present

## 2024-09-09 DIAGNOSIS — D649 Anemia, unspecified: Secondary | ICD-10-CM | POA: Diagnosis not present

## 2024-09-09 DIAGNOSIS — Z7983 Long term (current) use of bisphosphonates: Secondary | ICD-10-CM | POA: Diagnosis not present

## 2024-09-09 DIAGNOSIS — Z87891 Personal history of nicotine dependence: Secondary | ICD-10-CM | POA: Diagnosis not present

## 2024-09-09 DIAGNOSIS — C7951 Secondary malignant neoplasm of bone: Secondary | ICD-10-CM | POA: Diagnosis not present

## 2024-09-09 LAB — CBC WITH DIFFERENTIAL/PLATELET
Abs Immature Granulocytes: 0.01 K/uL (ref 0.00–0.07)
Basophils Absolute: 0 K/uL (ref 0.0–0.1)
Basophils Relative: 0 %
Eosinophils Absolute: 0.2 K/uL (ref 0.0–0.5)
Eosinophils Relative: 3 %
HCT: 40.4 % (ref 39.0–52.0)
Hemoglobin: 13.1 g/dL (ref 13.0–17.0)
Immature Granulocytes: 0 %
Lymphocytes Relative: 24 %
Lymphs Abs: 1.6 K/uL (ref 0.7–4.0)
MCH: 26.4 pg (ref 26.0–34.0)
MCHC: 32.4 g/dL (ref 30.0–36.0)
MCV: 81.3 fL (ref 80.0–100.0)
Monocytes Absolute: 0.9 K/uL (ref 0.1–1.0)
Monocytes Relative: 14 %
Neutro Abs: 3.8 K/uL (ref 1.7–7.7)
Neutrophils Relative %: 59 %
Platelets: 353 K/uL (ref 150–400)
RBC: 4.97 MIL/uL (ref 4.22–5.81)
RDW: 22 % — ABNORMAL HIGH (ref 11.5–15.5)
Smear Review: NORMAL
WBC: 6.5 K/uL (ref 4.0–10.5)
nRBC: 0 % (ref 0.0–0.2)

## 2024-09-09 LAB — COMPREHENSIVE METABOLIC PANEL WITH GFR
ALT: 25 U/L (ref 0–44)
AST: 21 U/L (ref 15–41)
Albumin: 3.6 g/dL (ref 3.5–5.0)
Alkaline Phosphatase: 225 U/L — ABNORMAL HIGH (ref 38–126)
Anion gap: 7 (ref 5–15)
BUN: 10 mg/dL (ref 8–23)
CO2: 24 mmol/L (ref 22–32)
Calcium: 8.5 mg/dL — ABNORMAL LOW (ref 8.9–10.3)
Chloride: 99 mmol/L (ref 98–111)
Creatinine, Ser: 0.74 mg/dL (ref 0.61–1.24)
GFR, Estimated: >60 mL/min (ref >60)
Glucose, Bld: 94 mg/dL (ref 70–99)
Potassium: 4 mmol/L (ref 3.5–5.1)
Sodium: 130 mmol/L — ABNORMAL LOW (ref 135–145)
Total Bilirubin: 0.5 mg/dL (ref 0.0–1.2)
Total Protein: 8.3 g/dL — ABNORMAL HIGH (ref 6.5–8.1)

## 2024-09-16 ENCOUNTER — Other Ambulatory Visit: Payer: Self-pay

## 2024-09-16 ENCOUNTER — Inpatient Hospital Stay: Attending: Hematology | Admitting: Oncology

## 2024-09-16 ENCOUNTER — Other Ambulatory Visit: Payer: Self-pay | Admitting: Pharmacy Technician

## 2024-09-16 ENCOUNTER — Inpatient Hospital Stay

## 2024-09-16 VITALS — BP 124/82 | HR 102 | Temp 97.4°F | Resp 18 | Wt 104.6 lb

## 2024-09-16 DIAGNOSIS — C7951 Secondary malignant neoplasm of bone: Secondary | ICD-10-CM | POA: Insufficient documentation

## 2024-09-16 DIAGNOSIS — G8929 Other chronic pain: Secondary | ICD-10-CM | POA: Diagnosis not present

## 2024-09-16 DIAGNOSIS — C7970 Secondary malignant neoplasm of unspecified adrenal gland: Secondary | ICD-10-CM | POA: Diagnosis not present

## 2024-09-16 DIAGNOSIS — M542 Cervicalgia: Secondary | ICD-10-CM | POA: Diagnosis not present

## 2024-09-16 DIAGNOSIS — C342 Malignant neoplasm of middle lobe, bronchus or lung: Secondary | ICD-10-CM | POA: Insufficient documentation

## 2024-09-16 DIAGNOSIS — D649 Anemia, unspecified: Secondary | ICD-10-CM | POA: Diagnosis not present

## 2024-09-16 DIAGNOSIS — D171 Benign lipomatous neoplasm of skin and subcutaneous tissue of trunk: Secondary | ICD-10-CM | POA: Diagnosis not present

## 2024-09-16 DIAGNOSIS — E871 Hypo-osmolality and hyponatremia: Secondary | ICD-10-CM | POA: Insufficient documentation

## 2024-09-16 DIAGNOSIS — G629 Polyneuropathy, unspecified: Secondary | ICD-10-CM | POA: Insufficient documentation

## 2024-09-16 DIAGNOSIS — C787 Secondary malignant neoplasm of liver and intrahepatic bile duct: Secondary | ICD-10-CM | POA: Diagnosis not present

## 2024-09-16 DIAGNOSIS — Z79899 Other long term (current) drug therapy: Secondary | ICD-10-CM | POA: Diagnosis not present

## 2024-09-16 DIAGNOSIS — Z7983 Long term (current) use of bisphosphonates: Secondary | ICD-10-CM | POA: Diagnosis not present

## 2024-09-16 DIAGNOSIS — C7931 Secondary malignant neoplasm of brain: Secondary | ICD-10-CM | POA: Insufficient documentation

## 2024-09-16 MED ORDER — SODIUM CHLORIDE 0.9 % IV SOLN: Freq: Once | INTRAVENOUS | Status: AC

## 2024-09-16 MED ORDER — ZOLEDRONIC ACID 4 MG/100ML IV SOLN
4.0000 mg | Freq: Once | INTRAVENOUS | Status: AC
  Administered 2024-09-16: 4 mg via INTRAVENOUS
  Filled 2024-09-16: qty 100

## 2024-09-16 NOTE — Patient Instructions (Signed)

## 2024-09-16 NOTE — Progress Notes (Signed)
 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 Extensive oncology history below S/p first-line treatment with afatinib  with recent progression 07/2024: Started on osimertinib  daily   - Patient tolerating osimertinib  80 mg daily with no complaints.  Denies rashes, fatigue.  Continue the current dose of osimertinib  - Reviewed PET scan from 06/2024.  Will repeat in 3 months that is 09/2024 - Reviewed MRI from 05/2024. Repeat scheduled for 12/2025Return to clinic in 1 month with labs and follow-up  Return to clinic in 1 month with labs and PET scan for follow up.  Metastasis to bone Received palliative radiation to right scapula and right hemipelvis   - Continue vitamin D and calcium supplementation -Continue monthly Zometa   Lipoma of back Lipoma causing pain and tightness in the back. Previous lipoma surgically removed.   - Refer to a surgeon for evaluation and management of neck lipoma.  Chronic neck pain Severe neck pain associated with lipoma, exacerbated by arm movement. Managed with Advil .   - Recommend use of Tylenol , two pills as needed for pain. - Advise use of Advil  if pain persists after two hours of Tylenol .  Hyponatremia Mild hyponatremia noted in labs  - Monitor sodium levels.  Peripheral neuropathy of fingers Mild numbness and tingling in fingers, not bothersome.Stable at this time  Anemia Anemia  improved with iron supplementation, resolved.   The patient understands the plans discussed today and is in agreement with them.  He knows to contact our office if he develops concerns prior to his next appointment.  20 minutes of total time was spent for this patient encounter, including preparation,review of records,  face-to-face counseling with the patient and coordination of care, physical exam, and documentation of the encounter.   Mickiel Dry, MD  [facility] [place] - A DEPT OF [facility] [address] [place] [address] Dept: [phone] Dept Fax: [phone]   Orders Placed This Encounter  Procedures   NM PET Image Restag (PS) Skull Base To Thigh    Standing Status:   Future    Expected Date:   10/17/2024    Expiration Date:   09/16/2025    If indicated for the ordered procedure, I authorize the administration of a radiopharmaceutical per Radiology protocol:   Yes    Preferred imaging location?:   [name]     ONCOLOGY HISTORY:   I have reviewed his chart and materials related to his cancer extensively and collaborated history with the patient. 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 Chest xray consistent with right pleural effusion -03/17/2023: CT chest w contrast: Right middle lobe mass with associated complete collapse of the right middle lobe, concerning for primary lung malignancy. Moderate right pleural effusion. Nodularity of the right minor  fissure, concerning for lymphatic spread of tumor. Lytic lesions of the right scapula and left 3rd and 4th ribs, highly concerning for osseous metastatic disease. Multiple enhancing liver lesions, concerning for metastatic disease. -03/30/2023: MRI brain: Many enhancing lesions in the brain, detailed above and compatible  with metastatic disease. The dominant/largest lesions are in the left frontal lobe. -04/06/2023: PET:  Right middle lobe primary bronchogenic carcinoma with mediastinal nodal, osseous, and probable upper abdominal nodal metastasis. Development of a right-sided pneumothorax since 03/16/2022 with persistent moderate right pleural effusion. Metastasis within the right acetabulum and proximal right femur may predispose the patient to fracture with weight-bearing. Bilateral adrenal hypermetabolism with mild nodularity. Suspicious for early adrenal metastasis. -04/10/2024: Pleural fluid, cytology: Malignant cells consistent with adenocarcinoma  -Tumor cells are positive for  TTF-1 while they are negative for p63 and Napsin A  -04/11/2023: Guardant 360: EGFR G719C, EGFR S768 , TP53: positive. TMB: not evaluable, MSI-stable, PDL1: TPS <1%.  -EGFR T790M and others, ALK,ROS1,BRAF,MET,ERBB2(HER2),RET.NTRK,KRAS: Negative -04/18/2023: LUNG, RML, FNAC: Non small cell carcinoma. IHC positive for TTF-1 and negative for p40. -05/02/2023-05/05/2023: Received palliative radiation to right lung, right hip and scapula -05/18/2023-07/17/2024: Afatinib  30mg  daily. Dose increased to 40mg  daily on 07/06/2023 -08/30/2023- 05/17/2024: MRI brain: Continued response to treatment -08/17/2023: PET: Interval response to therapy with decreased size and metabolic activity of the central right middle lobe mass and associated right hilar and mediastinal adenopathy. -11/23/2023: PET: Stable disease -03/21/2024: EZU:Pwrmzjdpwh metabolic activity associated with peribronchial nodular thickening in the anterior RIGHT lung/RIGHT middle lobe. Findings concerning for local lung recurrence.  -07/11/2024: PET: Mild disease progression. Increased hypermetabolic right middle lobe nodule, right pleural, thoracic nodal, and osseous metastasis  -07/17/2024- Current: Osimertinib  80 mg daily    Current Treatment:  Osimertinib  80 mg daily  INTERVAL  HISTORY:   Theodore [name] is here today for follow up and establish care with me.  Discussed the use of AI scribe software for clinical note transcription with the patient, who gave verbal consent to proceed.  History of Present Illness AZAAN LEASK is a 68 year old male with a history of lung cancer who presents with a painful neck lump.  He has had a lump on his neck for a couple of years, which has recently started causing severe pain, rated as 10/10 without the use of a heating pad. The pain worsens with arm elevation and is associated with a feeling of tightness across the neck.  He has a history of a lipoma on his buttock that was surgically removed years ago.  No significant side effects such as rashes or tiredness are reported from OSimertinib . He experiences numbness and tingling in his fingers, though it is not severe.  He has a history of anemia, which has improved with iron supplementation.  He experiences trouble sleeping, particularly due to pain, and uses Advil  and Tylenol  for pain management.  No smoking history is reported.    I have reviewed the past medical history, past surgical history, social history and family history with the patient and they are unchanged from previous note.  ALLERGIES:  is allergic to vueway  [gadopiclenol ].  MEDICATIONS:  Current Outpatient Medications  Medication Sig Dispense Refill   albuterol  (PROVENTIL  HFA;VENTOLIN  HFA) 108 (90 Base) MCG/ACT inhaler Inhale 2 puffs into the lungs every 4 (four) hours as needed for wheezing or shortness of breath.     BREZTRI AEROSPHERE 160-9-4.8 MCG/ACT AERO SMARTSIG:2 Puff(s) By Mouth Twice Daily     clindamycin  (CLINDAGEL) 1 % gel  APPLY TOPICALLY TO THE AFFECTED AREA TWICE DAILY 30 g 0   diphenoxylate -atropine  (LOMOTIL ) 2.5-0.025 MG tablet Take 1 tablet by mouth 4 (four) times daily as needed for diarrhea or loose stools. 120 tablet 3   folic acid  (FOLVITE ) 1 MG tablet TAKE 1 TABLET BY MOUTH EVERY DAY  90 tablet 3   HYDROCORTISONE , TOPICAL, 2.5 % SOLN Apply topically twice daily. 30 mL 0   ibuprofen  (ADVIL ) 800 MG tablet Take 1 tablet (800 mg total) by mouth every 12 (twelve) hours as needed. 30 tablet 0   LORazepam  (ATIVAN ) 0.5 MG tablet 1 tab po 30 minutes prior to radiation or MRI scans 30 tablet 0   osimertinib  mesylate (TAGRISSO ) 80 MG tablet Take 1 tablet (80 mg total) by mouth daily. 30 tablet 3   potassium chloride  SA (KLOR-CON  M) 20 MEQ tablet TAKE 1 TABLET(20 MEQ) BY MOUTH DAILY 90 tablet 3   No current facility-administered medications for this visit.    REVIEW OF SYSTEMS:   Constitutional: Denies fevers, chills or abnormal weight loss Eyes: Denies blurriness of vision Ears, nose, mouth, throat, and face: Denies mucositis or sore throat Respiratory: Denies cough, dyspnea or wheezes Cardiovascular: Denies palpitation, chest discomfort or lower extremity swelling Gastrointestinal:  Denies nausea, heartburn or change in bowel habits Skin: Denies abnormal skin rashes Lymphatics: Denies new lymphadenopathy or easy bruising Neurological:Denies numbness, tingling or new weaknesses Behavioral/Psych: Mood is stable, no new changes  All other systems were reviewed with the patient and are negative.   VITALS:  Blood pressure 124/82, pulse (!) 102, temperature (!) 97.4 F (36.3 C), temperature source Oral, resp. rate 18, weight 104 lb 9.6 oz (47.4 kg), SpO2 100%.  Wt Readings from Last 3 Encounters:  09/16/24 104 lb 9.6 oz (47.4 kg)  08/16/24 104 lb 6.4 oz (47.4 kg)  07/17/24 108 lb 0.4 oz (49 kg)    Body mass index is 15.45 kg/m.  Performance status (ECOG): 1 - Symptomatic but completely ambulatory  PHYSICAL EXAM:   GENERAL:alert, no distress and comfortable SKIN: Swelling in the interscapular region, slightly tender, soft, non adherent to skin LUNGS: clear to auscultation and percussion with normal breathing effort HEART: regular rate & rhythm and no murmurs and no lower  extremity edema ABDOMEN:abdomen soft, non-tender and normal bowel sounds Musculoskeletal:no cyanosis of digits and no clubbing  NEURO: alert & oriented x 3 with fluent speech, no focal motor/sensory deficits  LABORATORY DATA:  I have reviewed the data as listed   Lab Results  Component Value Date   WBC 6.5 09/09/2024   NEUTROABS 3.8 09/09/2024   HGB 13.1 09/09/2024   HCT 40.4 09/09/2024   MCV 81.3 09/09/2024   PLT 353 09/09/2024      Chemistry      Component Value Date/Time   NA 130 (L) 09/09/2024 1030   K 4.0 09/09/2024 1030   CL 99 09/09/2024 1030   CO2 24 09/09/2024 1030   BUN 10 09/09/2024 1030   CREATININE 0.74 09/09/2024 1030      Component Value Date/Time   CALCIUM 8.5 (L) 09/09/2024 1030   ALKPHOS 225 (H) 09/09/2024 1030   AST 21 09/09/2024 1030   ALT 25 09/09/2024 1030   BILITOT 0.5 09/09/2024 1030        RADIOGRAPHIC STUDIES: I have personally reviewed the radiological images as listed and agreed with the findings in the report.  NM PET Image Restag (PS) Skull Base To Thigh CLINICAL DATA:  Subsequent treatment strategy for metastatic lung cancer  restaging.  EXAM: NUCLEAR MEDICINE PET SKULL BASE TO THIGH  TECHNIQUE: 5.7 mCi F-18 FDG was injected intravenously. Full-ring PET imaging was performed from the skull base to thigh after the radiotracer. CT data was obtained and used for attenuation correction and anatomic localization.  Fasting blood glucose: 106 mg/dl  COMPARISON:  95/89/7974  FINDINGS: Mediastinal blood pool activity: SUV max 1.8  Liver activity: SUV max NA  NECK: No areas of abnormal hypermetabolism.  Incidental CT findings: Mucosal thickening of ethmoid air cells.  CHEST: The right middle lobe hypermetabolic nodule measures 2.3 x 2.2 cm and a S.U.V. max of 8.4 on 72/202. Compare 2.1 x 1.8 cm and a S.U.V. max of 6.6 on the prior.  Right axillary nodes measure maximally 8 mm and a S.U.V. max of 4.6 on 45/202. These are new  or increased since the prior.  Mediastinal and right hilar hypermetabolism is increased. Example right hilum at a S.U.V. max of 3.8 today versus a S.U.V. max of 3.1 on the prior.  Right-sided pleural thickening and multifocal hypermetabolism. Moderate loculated right-sided pleural effusion is increased.  Incidental CT findings: Aortic and coronary artery calcification. Centrilobular emphysema. Volume loss in the dependent right lower lobe likely due to rounded atelectasis.  ABDOMEN/PELVIS: Bilateral adrenal hypermetabolism is mild and without correlate dominant mass. No abdominopelvic nodal hypermetabolism.  Incidental CT findings: Presumed complex cyst involving the upper pole right kidney at 2.9 cm. Abdominal aortic atherosclerosis.  SKELETON: New and progressive osseous metastasis. Example left clavicular head at a S.U.V. max of 8.2 today versus a S.U.V. max of 4.6 on the prior.  Involving the posterior left third rib medially at a S.U.V. max of 3.8, new.  Involving the posterior aspect of the C4 versus less likely C3 vertebral body at a S.U.V. max of 4.4-new.  Incidental CT findings: Other sites of treated sclerotic osseous metastasis again identified. Examples throughout the right hemipelvis and acetabulum.  IMPRESSION: 1. Mild disease progression. 2. Increased hypermetabolic right middle lobe nodule, right pleural, thoracic nodal, and osseous metastasis as detailed above. 3. Mild increase in loculated moderate right pleural effusion. 4. Incidental findings, including: Coronary artery atherosclerosis. Aortic Atherosclerosis (ICD10-I70.0). Emphysema (ICD10-J43.9).  Electronically Signed   By: Rockey Kilts M.D.   On: 07/16/2024 15:05

## 2024-09-16 NOTE — Progress Notes (Signed)
 Patient presents today for Zometa  4mg  IV infusion per provider's orders. Vital signs stable and patient voiced no new complaints at this time.   Peripheral IV started with good blood return pre and post infusion.  Treatment given today per MD orders. Tolerated infusion without adverse affects. Vital signs stable. No complaints at this time. Discharged from clinic via roller walker ambulatory in stable condition. Alert and oriented x 3. F/U with [facility] as scheduled.

## 2024-09-16 NOTE — Progress Notes (Signed)
 Specialty Pharmacy Refill Coordination Note  Theodore [name] is a 68 y.o. male contacted today regarding refills of specialty medication(s) Osimertinib  Mesylate (TAGRISSO )   Patient requested Delivery   Delivery date: 09/18/24   Verified address: [address]  [name]   Medication will be filled on 09/17/24.

## 2024-09-20 ENCOUNTER — Encounter: Payer: Self-pay | Admitting: Oncology

## 2024-10-07 ENCOUNTER — Encounter: Payer: Self-pay | Admitting: Oncology

## 2024-10-08 ENCOUNTER — Encounter: Payer: Self-pay | Admitting: General Surgery

## 2024-10-08 ENCOUNTER — Ambulatory Visit: Admitting: General Surgery

## 2024-10-08 VITALS — BP 108/74 | HR 95 | Temp 97.5°F | Resp 12 | Ht 69.0 in | Wt 102.0 lb

## 2024-10-08 DIAGNOSIS — D170 Benign lipomatous neoplasm of skin and subcutaneous tissue of head, face and neck: Secondary | ICD-10-CM

## 2024-10-08 NOTE — Progress Notes (Signed)
 Theodore [name]; [id number]; [birth year]   HPI Patient is a 68 year old black male who was referred to my care by Dr. Davonna for evaluation and treatment of a lipoma on the back of his neck.  It has been present for some time.  He states it causes him pain constantly, but especially when he is lying down.  I have removed the lipoma from his right thigh in the past.  He is currently being treated for metastatic lung cancer to the brain, bone, and adrenal glands.  He denies any drainage from the wound.  He does have significant COPD with shortness of breath.  He states he gets short of breath when walking from the parking lot to my office.  He also has an intermittent cough. Past Medical History:  Diagnosis Date   COPD (chronic obstructive pulmonary disease) (HCC)     Past Surgical History:  Procedure Laterality Date   BRONCHIAL BIOPSY  04/18/2023   Procedure: BRONCHIAL BIOPSIES;  Surgeon: Brenna Adine LITTIE, DO;  Location: [facility] ENDOSCOPY;  Service: Pulmonary;;   BRONCHIAL NEEDLE ASPIRATION BIOPSY  04/18/2023   Procedure: BRONCHIAL NEEDLE ASPIRATION BIOPSIES;  Surgeon: Brenna Adine LITTIE, DO;  Location: [facility] ENDOSCOPY;  Service: Pulmonary;;   CHEST TUBE INSERTION Right 04/11/2023   Procedure: CHEST TUBE INSERTION;  Surgeon: Gladis Leonor HERO, MD;  Location[facility] ENDOSCOPY;  Service: Pulmonary;  Laterality: Right;  fentanyl  IV only   excision of cyst left ankle     MASS EXCISION Left 08/27/2018   Procedure: EXCISION LIPOMA LEFT BUTTOCK;  Surgeon: Mavis Anes, MD;  Location: [facility] ORS;  Service: General;  Laterality: Left;    Family History  Problem Relation Age of Onset   Colon cancer Maternal Grandmother    Colon cancer Maternal Uncle     Current Outpatient Medications on File Prior to Visit  Medication Sig Dispense Refill   albuterol  (PROVENTIL  HFA;VENTOLIN  HFA) 108 (90 Base) MCG/ACT inhaler Inhale 2 puffs into the lungs every 4 (four) hours as needed for wheezing or shortness of breath.     BREZTRI  AEROSPHERE 160-9-4.8 MCG/ACT AERO SMARTSIG:2 Puff(s) By Mouth Twice Daily     clindamycin  (CLINDAGEL) 1 % gel APPLY TOPICALLY TO THE AFFECTED AREA TWICE DAILY 30 g 0   diphenoxylate -atropine  (LOMOTIL ) 2.5-0.025 MG tablet Take 1 tablet by mouth 4 (four) times daily as needed for diarrhea or loose stools. 120 tablet 3   HYDROCORTISONE , TOPICAL, 2.5 % SOLN Apply topically twice daily. 30 mL 0   ibuprofen  (ADVIL ) 800 MG tablet Take 1 tablet (800 mg total) by mouth every 12 (twelve) hours as needed. 30 tablet 0   LORazepam  (ATIVAN ) 0.5 MG tablet 1 tab po 30 minutes prior to radiation or MRI scans 30 tablet 0   osimertinib  mesylate (TAGRISSO ) 80 MG tablet Take 1 tablet (80 mg total) by mouth daily. 30 tablet 3   potassium chloride  SA (KLOR-CON  M) 20 MEQ tablet TAKE 1 TABLET(20 MEQ) BY MOUTH DAILY 90 tablet 3   No current facility-administered medications on file prior to visit.    Allergies  Allergen Reactions   Vueway  [Gadopiclenol ] Hives    Social History   Substance and Sexual Activity  Alcohol Use Not Currently   Comment: Quit 1988    Social History   Tobacco Use  Smoking Status Former   Current packs/day: 0.00   Types: Cigarettes   Quit date: 2012   Years since quitting: 13.8  Smokeless Tobacco Never    Review of Systems  Constitutional:  Positive  for malaise/fatigue and weight loss.  HENT: Negative.    Eyes:  Positive for blurred vision.  Respiratory:  Positive for cough and shortness of breath.   Cardiovascular: Negative.   Gastrointestinal: Negative.   Genitourinary: Negative.   Musculoskeletal:  Positive for neck pain.  Skin: Negative.   Neurological: Negative.   Endo/Heme/Allergies: Negative.   Psychiatric/Behavioral: Negative.      Objective   Vitals:   10/08/24 1329  BP: 108/74  Pulse: 95  Resp: 12  Temp: (!) 97.5 F (36.4 C)  SpO2: 97%    Physical Exam Vitals reviewed. Nursing note reviewed: Cachectic in appearance. Constitutional:       Appearance: Normal appearance. He is not ill-appearing.  HENT:     Head: Normocephalic and atraumatic.  Neck:     Comments: A 4 to 5 cm ovoid rubbery subcutaneous mass noted along the posterior midline of the neck.  Overall, patient has limited subcutaneous tissue overlying the spine. Cardiovascular:     Rate and Rhythm: Normal rate and regular rhythm.     Heart sounds: Normal heart sounds. No murmur heard.    No friction rub. No gallop.  Pulmonary:     Effort: Pulmonary effort is normal. No respiratory distress.     Breath sounds: Normal breath sounds. No stridor. No wheezing, rhonchi or rales.  Musculoskeletal:     Cervical back: Normal range of motion.  Skin:    General: Skin is warm and dry.  Neurological:     Mental Status: He is alert and oriented to person, place, and time.   Oncology notes reviewed  Assessment  Lipoma of posterior neck Metastatic lung disease to brain, bone, and adrenal glands Plan  I suspect the pain he is sensing is more musculoskeletal that it is from the lipoma.  In addition, he has no subcutaneous tissue over the bone and I am concerned that removing the lipoma may cause the skin to adhere to the spine.  He is a high risk surgical candidate given his COPD, metastatic lung disease, and overall cachectic state.  I told the patient that I would not be able to remove the lipoma safely.  In addition, I told him that removing the lipoma may not alleviate the pain that he is having.  He understands and agrees.  Follow-up here expectantly.

## 2024-10-10 ENCOUNTER — Ambulatory Visit (HOSPITAL_COMMUNITY)
Admission: RE | Admit: 2024-10-10 | Discharge: 2024-10-10 | Disposition: A | Discharge status: Home / Self Care | Source: Ambulatory Visit | Attending: Oncology | Admitting: Oncology

## 2024-10-10 ENCOUNTER — Inpatient Hospital Stay

## 2024-10-10 ENCOUNTER — Other Ambulatory Visit (HOSPITAL_COMMUNITY): Payer: Self-pay

## 2024-10-10 DIAGNOSIS — D649 Anemia, unspecified: Secondary | ICD-10-CM

## 2024-10-10 DIAGNOSIS — C7951 Secondary malignant neoplasm of bone: Secondary | ICD-10-CM | POA: Insufficient documentation

## 2024-10-10 DIAGNOSIS — C342 Malignant neoplasm of middle lobe, bronchus or lung: Secondary | ICD-10-CM | POA: Insufficient documentation

## 2024-10-10 LAB — IRON AND TIBC
Iron: 35 ug/dL — ABNORMAL LOW (ref 45–182)
Saturation Ratios: 19 % (ref 17.9–39.5)
TIBC: 185 ug/dL — ABNORMAL LOW (ref 250–450)
UIBC: 150 ug/dL

## 2024-10-10 LAB — FOLATE: Folate: 5.2 ng/mL — ABNORMAL LOW (ref >5.9)

## 2024-10-10 LAB — VITAMIN B12: Vitamin B-12: 317 pg/mL (ref 180–914)

## 2024-10-10 LAB — FERRITIN: Ferritin: 1021 ng/mL — ABNORMAL HIGH (ref 24–336)

## 2024-10-10 MED ORDER — FLUDEOXYGLUCOSE F - 18 (FDG) INJECTION
5.1700 | Freq: Once | INTRAVENOUS | Status: AC | PRN
  Administered 2024-10-10: 5.17 via INTRAVENOUS

## 2024-10-11 ENCOUNTER — Other Ambulatory Visit (HOSPITAL_COMMUNITY): Payer: Self-pay

## 2024-10-14 ENCOUNTER — Other Ambulatory Visit: Payer: Self-pay

## 2024-10-14 NOTE — Progress Notes (Signed)
 Specialty Pharmacy Refill Coordination Note  RC AMISON is a 68 y.o. male contacted today regarding refills of specialty medication(s) Osimertinib  Mesylate (TAGRISSO )   Patient requested Delivery   Delivery date: 10/18/24   Verified address: [address]  [place]   Medication will be filled on: 10/17/24

## 2024-10-16 ENCOUNTER — Other Ambulatory Visit: Payer: Self-pay

## 2024-10-16 NOTE — Progress Notes (Deleted)
 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 Extensive oncology history below S/p first-line treatment with afatinib  with recent progression 07/2024: Started on osimertinib  daily   - Patient tolerating osimertinib  80 mg daily with no complaints.  Denies rashes, fatigue.  Continue the current dose of osimertinib  - Reviewed PET scan from 06/2024.  Will repeat in 3 months that is 09/2024 - Reviewed MRI from 05/2024. Repeat scheduled for 12/2025Return to clinic in 1 month with labs and follow-up  Return to clinic in 1 month with labs and PET scan for follow up.  Metastasis to bone Received palliative radiation to right scapula and right hemipelvis   - Continue vitamin D and calcium supplementation -Continue monthly Zometa   Lipoma of back Lipoma causing pain and tightness in the back. Previous lipoma surgically removed.   - Refer to a surgeon for evaluation and management of neck lipoma.  Chronic neck pain Severe neck pain associated with lipoma, exacerbated by arm movement. Managed with Advil .   - Recommend use of Tylenol , two pills as needed for pain. - Advise use of Advil  if pain persists after two hours of Tylenol .  Hyponatremia Mild hyponatremia noted in labs  - Monitor sodium levels.  Peripheral neuropathy of fingers Mild numbness and tingling in fingers, not bothersome.Stable at this time  Anemia Anemia  improved with iron supplementation, resolved.   The patient understands the plans discussed today and is in agreement with them.  He knows to contact our office if he develops concerns prior to his next appointment.  20 minutes of total time was spent for this patient encounter, including preparation,review of records,  face-to-face counseling with the patient and coordination of care, physical exam, and documentation of the encounter.   Mickiel Dry, MD  [facility] [place] - A DEPT OF [facility] [address] [place] [address] Dept: [phone] Dept Fax: [phone]   No orders of the defined types were placed in this encounter.    ONCOLOGY HISTORY:   I have reviewed his chart and materials related to his cancer extensively and collaborated history with the patient. 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 Chest xray consistent with right pleural effusion -03/17/2023: CT chest w contrast: Right middle lobe mass with associated complete collapse of the right middle lobe, concerning for primary lung malignancy. Moderate right pleural effusion. Nodularity of the right minor fissure, concerning for lymphatic spread of tumor. Lytic lesions of the right scapula and left 3rd and 4th ribs, highly concerning for osseous metastatic disease. Multiple enhancing liver lesions, concerning for metastatic disease. -03/30/2023: MRI brain: Many enhancing lesions in the brain, detailed above and compatible with metastatic disease. The dominant/largest lesions are in the left frontal lobe. -04/06/2023: PET:  Right middle  lobe primary bronchogenic carcinoma with mediastinal nodal, osseous, and probable upper abdominal nodal metastasis. Development of a right-sided pneumothorax since 03/16/2022 with  persistent moderate right pleural effusion. Metastasis within the right acetabulum and proximal right femur may predispose the patient to fracture with weight-bearing. Bilateral adrenal hypermetabolism with mild nodularity. Suspicious for early adrenal metastasis. -04/10/2024: Pleural fluid, cytology: Malignant cells consistent with adenocarcinoma  -Tumor cells are positive for  TTF-1 while they are negative for p63 and Napsin A  -04/11/2023: Guardant 360: EGFR G719C, EGFR S768 , TP53: positive. TMB: not evaluable, MSI-stable, PDL1: TPS <1%.  -EGFR T790M and others, ALK, ROS1, BRAF, MET, ERBB2(HER2), RET, NTRK, KRAS: Negative -04/18/2023: LUNG, RML, FNAC: Non small cell carcinoma. IHC positive for TTF-1 and negative for p40. -05/02/2023-05/05/2023: Received palliative radiation to right lung, right hip and scapula -05/18/2023-07/17/2024: Afatinib  30mg  daily. Dose increased to 40mg  daily on 07/06/2023 -08/30/2023- 05/17/2024: MRI brain: Continued response to treatment -08/17/2023: PET: Interval response to therapy with decreased size and metabolic activity of the central right middle lobe mass and associated right hilar and mediastinal adenopathy. -11/23/2023: PET: Stable disease -03/21/2024: EZU:Pwrmzjdpwh metabolic activity associated with peribronchial nodular thickening in the anterior RIGHT lung/RIGHT middle lobe. Findings concerning for local lung recurrence.  -07/11/2024: PET: Mild disease progression. Increased hypermetabolic right middle lobe nodule, right pleural, thoracic nodal, and osseous metastasis  -07/17/2024- Current: Osimertinib  80 mg daily - 10/10/2024: PET scan: Substantially increased number and activity of sclerotic osseous metastatic lesions, including new index lesions at the right lateral mass of C2 (SUV 4.5), T4 (SUV 6.8, previously 4.4), and left anterior iliac bone (SUV 3.8). Large right pleural effusion with pleural thickening compatible with exudative effusion.  Hypermetabolic right middle lobe pulmonary nodule (SUV 7.4, previously 8.9), partially obscured by atelectasis. Small new focus of metabolic activity in the liver near the gallbladder fossa (SUV 3.3), early metastasis not excluded.    Current Treatment:  Osimertinib  80 mg daily  INTERVAL HISTORY:   TRENNON TORBECK is here today for follow up and establish care with me.  Discussed the use of AI scribe software for clinical note transcription with the patient, who gave verbal consent to proceed.  History of Present Illness MARKS SCALERA is a 68 year old male with a history of lung cancer who presents with a painful neck lump.  He has had a lump on his neck for a couple of years, which has recently started causing severe pain, rated as 10/10 without the use of a heating pad. The pain worsens with arm elevation and is associated with a feeling of tightness across the neck.  He has a history of a lipoma on his buttock that was surgically removed years ago.  No significant side effects such as rashes or tiredness are reported from OSimertinib . He experiences numbness and tingling in his fingers, though it is not severe.  He has a history of anemia, which has improved with iron supplementation.  He experiences trouble sleeping, particularly due to pain, and uses Advil  and Tylenol  for pain management.  No smoking history is reported.    I have reviewed the past medical history, past surgical history, social history and family history with the patient and they are unchanged from previous note.  ALLERGIES:  is allergic to vueway  [gadopiclenol ].  MEDICATIONS:  Current Outpatient Medications  Medication Sig Dispense Refill   albuterol  (PROVENTIL  HFA;VENTOLIN  HFA) 108 (90 Base) MCG/ACT inhaler Inhale 2 puffs into the lungs every 4 (four) hours as needed for wheezing or  shortness of breath.     BREZTRI AEROSPHERE 160-9-4.8 MCG/ACT AERO SMARTSIG:2 Puff(s) By Mouth Twice Daily     clindamycin   (CLINDAGEL) 1 % gel APPLY TOPICALLY TO THE AFFECTED AREA TWICE DAILY 30 g 0   diphenoxylate -atropine  (LOMOTIL ) 2.5-0.025 MG tablet Take 1 tablet by mouth 4 (four) times daily as needed for diarrhea or loose stools. 120 tablet 3   HYDROCORTISONE , TOPICAL, 2.5 % SOLN Apply topically twice daily. 30 mL 0   ibuprofen  (ADVIL ) 800 MG tablet Take 1 tablet (800 mg total) by mouth every 12 (twelve) hours as needed. 30 tablet 0   LORazepam  (ATIVAN ) 0.5 MG tablet 1 tab po 30 minutes prior to radiation or MRI scans 30 tablet 0   osimertinib  mesylate (TAGRISSO ) 80 MG tablet Take 1 tablet (80 mg total) by mouth daily. 30 tablet 3   potassium chloride  SA (KLOR-CON  M) 20 MEQ tablet TAKE 1 TABLET(20 MEQ) BY MOUTH DAILY 90 tablet 3   No current facility-administered medications for this visit.    REVIEW OF SYSTEMS:   Constitutional: Denies fevers, chills or abnormal weight loss Eyes: Denies blurriness of vision Ears, nose, mouth, throat, and face: Denies mucositis or sore throat Respiratory: Denies cough, dyspnea or wheezes Cardiovascular: Denies palpitation, chest discomfort or lower extremity swelling Gastrointestinal:  Denies nausea, heartburn or change in bowel habits Skin: Denies abnormal skin rashes Lymphatics: Denies new lymphadenopathy or easy bruising Neurological:Denies numbness, tingling or new weaknesses Behavioral/Psych: Mood is stable, no new changes  All other systems were reviewed with the patient and are negative.   VITALS:  There were no vitals taken for this visit.  Wt Readings from Last 3 Encounters:  10/08/24 102 lb (46.3 kg)  09/16/24 104 lb 9.6 oz (47.4 kg)  08/16/24 104 lb 6.4 oz (47.4 kg)    There is no height or weight on file to calculate BMI.  Performance status (ECOG): 1 - Symptomatic but completely ambulatory  PHYSICAL EXAM:   GENERAL:alert, no distress and comfortable SKIN: Swelling in the interscapular region, slightly tender, soft, non adherent to skin LUNGS:  clear to auscultation and percussion with normal breathing effort HEART: regular rate & rhythm and no murmurs and no lower extremity edema ABDOMEN:abdomen soft, non-tender and normal bowel sounds Musculoskeletal:no cyanosis of digits and no clubbing  NEURO: alert & oriented x 3 with fluent speech, no focal motor/sensory deficits  LABORATORY DATA:  I have reviewed the data as listed   Lab Results  Component Value Date   WBC 6.5 09/09/2024   NEUTROABS 3.8 09/09/2024   HGB 13.1 09/09/2024   HCT 40.4 09/09/2024   MCV 81.3 09/09/2024   PLT 353 09/09/2024      Chemistry      Component Value Date/Time   NA 130 (L) 09/09/2024 1030   K 4.0 09/09/2024 1030   CL 99 09/09/2024 1030   CO2 24 09/09/2024 1030   BUN 10 09/09/2024 1030   CREATININE 0.74 09/09/2024 1030      Component Value Date/Time   CALCIUM 8.5 (L) 09/09/2024 1030   ALKPHOS 225 (H) 09/09/2024 1030   AST 21 09/09/2024 1030   ALT 25 09/09/2024 1030   BILITOT 0.5 09/09/2024 1030        RADIOGRAPHIC STUDIES: I have personally reviewed the radiological images as listed and agreed with the findings in the report.  NM PET Image Restag (PS) Skull Base To Thigh EXAM: PET AND CT SKULL BASE TO MID THIGH 10/10/2024 04:07:01 PM  TECHNIQUE:  RADIOPHARMACEUTICAL: 5.17  mCi F-18 FDG Uptake time 60 minutes. Glucose level 101 mg/dl. Blood pool SUV 1.9.  PET imaging was acquired from the base of the skull to the mid thighs. Non-contrast enhanced computed tomography was obtained for attenuation correction and anatomic localization.  COMPARISON: 07/11/2024  CLINICAL HISTORY: Metastatic lung cancer restaging.  FINDINGS:  HEAD AND NECK: No metabolically active cervical lymphadenopathy. Air fluid level in the right sphenoid sinus favoring acute sinusitis. Chronic bilateral ethmoid sinusitis. New index lesion in the right lateral mass of C2 with maximum SUV 4.5.  CHEST: Hypermetabolic nodule inferiorly in the right  middle lobe of maximum SUV 7.4, previously 8.9. This is partially obscured by surrounding atelectasis. Rounded atelectasis noted in the right upper lobe and near the minor fissure. Severe emphysema. Notable volume loss in the right hemithorax. Large right pleural effusion with right pleural thickening compatible with exudative effusion. Increased index lesion at the T4 vertebral level with maximum SUV 6.8, previously 4.4. No metabolically active lymphadenopathy. Bilateral mesoacromial os acromiale.  ABDOMEN AND PELVIS: Small focus of accentuated metabolic activity in the liver near the gallbladder fossa, maximum SUV 3.3, not previously present. Early metastatic lesion not excluded. Irregular rim calcified photopenic lesion of the right kidney upper pole, likely a cyst, similar to previous. New index lesion of the left anterior iliac bone with maximum SUV 3.8. No metabolically active intraperitoneal mass. No metabolically active lymphadenopathy. Physiologic activity within the gastrointestinal and genitourinary systems.  BONES AND SOFT TISSUE: Substantially increased number and increased activity of scattered sclerotic osseous metastatic lesions involving the skull base, left mandibular condyle, cervical, thoracic, and lumbar spine, left clavicle, ribs, bilateral iliac bones, and right pubic bone. In addition, there are innumerable tiny speckled sclerotic lesions throughout the axial skeleton indicating a background of extensive spiculated sclerotic metastatic involvement. Paucity of fatty tissue suggesting cachexia. Aortic and right coronary artery atherosclerosis. Bilateral mesoacromial os acromiale.  IMPRESSION: 1. Substantially increased number and activity of sclerotic osseous metastatic lesions, including new index lesions at the right lateral mass of C2 (SUV 4.5), T4 (SUV 6.8, previously 4.4), and left anterior iliac bone (SUV 3.8). 2. Large right pleural effusion with pleural  thickening compatible with exudative effusion. 3. Hypermetabolic right middle lobe pulmonary nodule (SUV 7.4, previously 8.9), partially obscured by atelectasis. 4. Small new focus of metabolic activity in the liver near the gallbladder fossa (SUV 3.3), early metastasis not excluded. 5. Severe emphysema. 6. Rounded atelectasis in the right upper lobe and near the minor fissure. 7. Aortic and right coronary artery atherosclerosis. 8. Degenerative changes at the bilateral acromioclavicular joints consistent with os acromiale.  Electronically signed by: Ryan Salvage MD 10/13/2024 04:39 PM EST RP Workstation: [id number]

## 2024-10-17 ENCOUNTER — Inpatient Hospital Stay

## 2024-10-17 ENCOUNTER — Inpatient Hospital Stay: Admitting: Oncology

## 2024-10-22 ENCOUNTER — Other Ambulatory Visit: Payer: Self-pay

## 2024-10-22 DIAGNOSIS — C342 Malignant neoplasm of middle lobe, bronchus or lung: Secondary | ICD-10-CM

## 2024-10-22 DIAGNOSIS — C7951 Secondary malignant neoplasm of bone: Secondary | ICD-10-CM

## 2024-10-22 DIAGNOSIS — C7931 Secondary malignant neoplasm of brain: Secondary | ICD-10-CM

## 2024-10-22 NOTE — Progress Notes (Unsigned)
 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 Extensive oncology history below S/p first-line treatment with afatinib  with recent progression 07/2024: Started on osimertinib  daily   - We reviewed the recent PET scan findings together.  There is worsening osseous metastasis and also probably new liver nodule. - We discussed sending blood NGS and proceeding to next treatment options at this time. - Risk versus benefits of amivantamab with chemotherapy as per [place] trial or doing Amivantamab/lazertinib for positive EGFR mutation. - Patient at this time wishes to not proceed with further treatments. He would like to choose comfort care over treatment toxicities and would like to consider hospice. - Patient does understand that without treatment, his life span is likely limited to a few months - Will refer to hospice  Hypotension Patient was hypotensive on arrival to the clinic with BP of 64/80mmHg and HR of 60 bpm. Also reported dizziness Was alert and oriented  - 1L IV fluid bolus given with significant improvement.    Metastasis to bone Received palliative radiation to right scapula and right hemipelvis  Lipoma of back Lipoma causing pain and tightness in the back. Previous lipoma surgically removed.  Referred to surgery. Considered not a candidate for surgery ( Skin adherence to spine after surgery and severe  COPD)  Chronic neck pain Severe neck pain associated with lipoma, exacerbated by arm movement. Managed with Advil .   - Recommend use of Tylenol , two pills as needed for pain. - Advise use of Advil  if pain persists after two hours of Tylenol .   The patient understands the plans discussed today and is in agreement with them.  He knows to contact our office if he develops concerns prior to his next appointment.  The total time spent in the appointment was 40 minutes for the encounter with patient, including review of chart and various tests results, discussions about plan of care and coordination of care plan    Theodore Dry, MD  [facility]  - A DEPT OF [facility] [address] [place] [address] Dept: [phone] Dept Fax: [phone]   No orders of the defined types were placed in this encounter.    ONCOLOGY HISTORY:   I have reviewed his chart and materials related to his cancer extensively and collaborated history with the patient. 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 Chest xray consistent with right pleural effusion -03/17/2023: CT chest w contrast: Right middle lobe mass with associated complete collapse of the right middle lobe, concerning for primary lung malignancy. Moderate right pleural effusion. Nodularity of the right minor fissure, concerning for lymphatic spread of tumor. Lytic lesions of the right scapula and left 3rd and 4th ribs, highly concerning for osseous metastatic disease. Multiple enhancing liver lesions,  concerning for metastatic disease. -03/30/2023: MRI brain: Many enhancing lesions in the brain, detailed above and compatible with metastatic disease. The dominant/largest lesions are in the left frontal lobe. -04/06/2023: PET:  Right middle  lobe primary bronchogenic carcinoma with mediastinal nodal, osseous, and probable upper abdominal nodal metastasis. Development of a right-sided pneumothorax since 03/16/2022 with persistent moderate right pleural effusion. Metastasis within the right acetabulum and proximal right femur may predispose the patient to fracture with weight-bearing. Bilateral adrenal hypermetabolism with mild nodularity. Suspicious for early adrenal metastasis. -04/10/2024: Pleural fluid, cytology: Malignant cells consistent with adenocarcinoma  -Tumor cells are positive for  TTF-1 while they are negative for p63 and Napsin A  -04/11/2023: Guardant 360: EGFR G719C, EGFR S768 , TP53: positive. TMB: not evaluable, MSI-stable, PDL1: TPS <1%.  -EGFR T790M and others, ALK, ROS1, BRAF, MET, ERBB2(HER2), RET, NTRK, KRAS: Negative -04/18/2023: LUNG, RML, FNAC: Non small cell carcinoma. IHC positive for TTF-1 and negative for p40. -05/02/2023-05/05/2023: Received palliative radiation to right lung, right hip and scapula -05/18/2023-07/17/2024: Afatinib  30mg  daily. Dose increased to 40mg  daily on 07/06/2023 -08/30/2023- 05/17/2024: MRI brain: Continued response to treatment -08/17/2023: PET: Interval response to therapy with decreased size and metabolic activity of the central right middle lobe mass and associated right hilar and mediastinal adenopathy. -11/23/2023: PET: Stable disease -03/21/2024: EZU:Pwrmzjdpwh metabolic activity associated with peribronchial nodular thickening in the anterior RIGHT lung/RIGHT middle lobe. Findings concerning for local lung recurrence.  -07/11/2024: PET: Mild disease progression. Increased hypermetabolic right middle lobe nodule, right pleural, thoracic nodal, and osseous metastasis  -07/17/2024- Current: Osimertinib  80 mg daily - 10/10/2024: PET scan: Substantially increased number and activity of sclerotic osseous metastatic lesions, including new index lesions at the right lateral mass of C2  (SUV 4.5), T4 (SUV 6.8, previously 4.4), and left anterior iliac bone (SUV 3.8). Large right pleural effusion with pleural thickening compatible with exudative effusion. Hypermetabolic right middle lobe pulmonary nodule (SUV 7.4, previously 8.9), partially obscured by atelectasis. Small new focus of metabolic activity in the liver near the gallbladder fossa (SUV 3.3), early metastasis not excluded.    Current Treatment: Referred for hospice  INTERVAL HISTORY:   Theodore [name] is a 68 y.o. male presenting for follow up of his lung cancer. He is accompanied by his daughter today.  Patient was hypotensive on arrival to the clinic with a blood pressure of 64 over 49 mmHg along with dizziness.  He received 1 L fluid bolus and significantly improved.  He was alert and oriented through the whole episode.  We discussed the recent PET scan findings and that there is clear progression of disease.  Patient does not [name] to get further treatments and would like to consider hospice.  We discussed the risk versus benefits in detail.  I have reviewed the past medical history, past surgical history, social history and family history with the patient and they are unchanged from previous note.  ALLERGIES:  is allergic to vueway  [gadopiclenol ].  MEDICATIONS:  Current Outpatient Medications  Medication Sig Dispense Refill   albuterol  (PROVENTIL  HFA;VENTOLIN  HFA) 108 (90 Base) MCG/ACT inhaler Inhale 2 puffs into the lungs every 4 (four) hours as needed for wheezing or shortness of breath.     BREZTRI AEROSPHERE 160-9-4.8 MCG/ACT AERO SMARTSIG:2 Puff(s) By Mouth Twice Daily     clindamycin  (CLINDAGEL) 1 % gel APPLY TOPICALLY TO THE AFFECTED AREA TWICE DAILY 30 g 0   diphenoxylate -atropine  (LOMOTIL ) 2.5-0.025 MG tablet Take 1 tablet by mouth 4 (four) times daily as needed  for diarrhea or loose stools. 120 tablet 3   HYDROCORTISONE , TOPICAL, 2.5 % SOLN Apply topically twice daily. 30 mL 0   ibuprofen  (ADVIL ) 800  MG tablet Take 1 tablet (800 mg total) by mouth every 12 (twelve) hours as needed. 30 tablet 0   LORazepam  (ATIVAN ) 0.5 MG tablet 1 tab po 30 minutes prior to radiation or MRI scans 30 tablet 0   osimertinib  mesylate (TAGRISSO ) 80 MG tablet Take 1 tablet (80 mg total) by mouth daily. 30 tablet 3   potassium chloride  SA (KLOR-CON  M) 20 MEQ tablet TAKE 1 TABLET(20 MEQ) BY MOUTH DAILY 90 tablet 3   No current facility-administered medications for this visit.    REVIEW OF SYSTEMS:   Constitutional: Denies fevers, chills or abnormal weight loss Eyes: Denies blurriness of vision Ears, nose, mouth, throat, and face: Denies mucositis or sore throat Respiratory: Denies cough, dyspnea or wheezes Cardiovascular: Denies palpitation, chest discomfort or lower extremity swelling Gastrointestinal:  Denies nausea, heartburn or change in bowel habits Skin: Denies abnormal skin rashes Lymphatics: Denies new lymphadenopathy or easy bruising Neurological:Denies numbness, tingling or new weaknesses Behavioral/Psych: Mood is stable, no new changes  All other systems were reviewed with the patient and are negative.   VITALS:  There were no vitals taken for this visit.  Wt Readings from Last 3 Encounters:  10/08/24 102 lb (46.3 kg)  09/16/24 104 lb 9.6 oz (47.4 kg)  08/16/24 104 lb 6.4 oz (47.4 kg)    There is no height or weight on file to calculate BMI.  Performance status (ECOG): 1 - Symptomatic but completely ambulatory  PHYSICAL EXAM:   GENERAL:alert, no distress and comfortable SKIN: Swelling in the interscapular region, slightly tender, soft, non adherent to skin LUNGS: clear to auscultation and percussion with normal breathing effort HEART: regular rate & rhythm and no murmurs and no lower extremity edema ABDOMEN:abdomen soft, non-tender and normal bowel sounds Musculoskeletal:no cyanosis of digits and no clubbing  NEURO: alert & oriented x 3 with fluent speech  LABORATORY DATA:  I have  reviewed the data as listed   Lab Results  Component Value Date   WBC 6.5 09/09/2024   NEUTROABS 3.8 09/09/2024   HGB 13.1 09/09/2024   HCT 40.4 09/09/2024   MCV 81.3 09/09/2024   PLT 353 09/09/2024      Chemistry      Component Value Date/Time   NA 130 (L) 09/09/2024 1030   K 4.0 09/09/2024 1030   CL 99 09/09/2024 1030   CO2 24 09/09/2024 1030   BUN 10 09/09/2024 1030   CREATININE 0.74 09/09/2024 1030      Component Value Date/Time   CALCIUM 8.5 (L) 09/09/2024 1030   ALKPHOS 225 (H) 09/09/2024 1030   AST 21 09/09/2024 1030   ALT 25 09/09/2024 1030   BILITOT 0.5 09/09/2024 1030        RADIOGRAPHIC STUDIES: I have personally reviewed the radiological images as listed and agreed with the findings in the report.  NM PET Image Restag (PS) Skull Base To Thigh EXAM: PET AND CT SKULL BASE TO MID THIGH 10/10/2024 04:07:01 PM  TECHNIQUE:  RADIOPHARMACEUTICAL: 5.17 mCi F-18 FDG Uptake time 60 minutes. Glucose level 101 mg/dl. Blood pool SUV 1.9.  PET imaging was acquired from the base of the skull to the mid thighs. Non-contrast enhanced computed tomography was obtained for attenuation correction and anatomic localization.  COMPARISON: 07/11/2024  CLINICAL HISTORY: Metastatic lung cancer restaging.  FINDINGS:  HEAD AND NECK: No metabolically  active cervical lymphadenopathy. Air fluid level in the right sphenoid sinus favoring acute sinusitis. Chronic bilateral ethmoid sinusitis. New index lesion in the right lateral mass of C2 with maximum SUV 4.5.  CHEST: Hypermetabolic nodule inferiorly in the right middle lobe of maximum SUV 7.4, previously 8.9. This is partially obscured by surrounding atelectasis. Rounded atelectasis noted in the right upper lobe and near the minor fissure. Severe emphysema. Notable volume loss in the right hemithorax. Large right pleural effusion with right pleural thickening compatible with exudative effusion. Increased index  lesion at the T4 vertebral level with maximum SUV 6.8, previously 4.4. No metabolically active lymphadenopathy. Bilateral mesoacromial os acromiale.  ABDOMEN AND PELVIS: Small focus of accentuated metabolic activity in the liver near the gallbladder fossa, maximum SUV 3.3, not previously present. Early metastatic lesion not excluded. Irregular rim calcified photopenic lesion of the right kidney upper pole, likely a cyst, similar to previous. New index lesion of the left anterior iliac bone with maximum SUV 3.8. No metabolically active intraperitoneal mass. No metabolically active lymphadenopathy. Physiologic activity within the gastrointestinal and genitourinary systems.  BONES AND SOFT TISSUE: Substantially increased number and increased activity of scattered sclerotic osseous metastatic lesions involving the skull base, left mandibular condyle, cervical, thoracic, and lumbar spine, left clavicle, ribs, bilateral iliac bones, and right pubic bone. In addition, there are innumerable tiny speckled sclerotic lesions throughout the axial skeleton indicating a background of extensive spiculated sclerotic metastatic involvement. Paucity of fatty tissue suggesting cachexia. Aortic and right coronary artery atherosclerosis. Bilateral mesoacromial os acromiale.  IMPRESSION: 1. Substantially increased number and activity of sclerotic osseous metastatic lesions, including new index lesions at the right lateral mass of C2 (SUV 4.5), T4 (SUV 6.8, previously 4.4), and left anterior iliac bone (SUV 3.8). 2. Large right pleural effusion with pleural thickening compatible with exudative effusion. 3. Hypermetabolic right middle lobe pulmonary nodule (SUV 7.4, previously 8.9), partially obscured by atelectasis. 4. Small new focus of metabolic activity in the liver near the gallbladder fossa (SUV 3.3), early metastasis not excluded. 5. Severe emphysema. 6. Rounded atelectasis in the right upper lobe  and near the minor fissure. 7. Aortic and right coronary artery atherosclerosis. 8. Degenerative changes at the bilateral acromioclavicular joints consistent with os acromiale.  Electronically signed by: Ryan Salvage MD 10/13/2024 04:39 PM EST RP Workstation: [id number]

## 2024-10-23 ENCOUNTER — Inpatient Hospital Stay: Attending: Hematology | Admitting: Oncology

## 2024-10-23 ENCOUNTER — Inpatient Hospital Stay

## 2024-10-23 VITALS — BP 96/64 | HR 73 | Temp 97.6°F | Resp 18

## 2024-10-23 DIAGNOSIS — C342 Malignant neoplasm of middle lobe, bronchus or lung: Secondary | ICD-10-CM | POA: Insufficient documentation

## 2024-10-23 DIAGNOSIS — C7931 Secondary malignant neoplasm of brain: Secondary | ICD-10-CM

## 2024-10-23 DIAGNOSIS — D171 Benign lipomatous neoplasm of skin and subcutaneous tissue of trunk: Secondary | ICD-10-CM | POA: Insufficient documentation

## 2024-10-23 DIAGNOSIS — C7951 Secondary malignant neoplasm of bone: Secondary | ICD-10-CM

## 2024-10-23 DIAGNOSIS — I9589 Other hypotension: Secondary | ICD-10-CM | POA: Diagnosis not present

## 2024-10-23 DIAGNOSIS — Z923 Personal history of irradiation: Secondary | ICD-10-CM | POA: Insufficient documentation

## 2024-10-23 DIAGNOSIS — I959 Hypotension, unspecified: Secondary | ICD-10-CM

## 2024-10-23 DIAGNOSIS — G8929 Other chronic pain: Secondary | ICD-10-CM | POA: Diagnosis not present

## 2024-10-23 DIAGNOSIS — M542 Cervicalgia: Secondary | ICD-10-CM | POA: Diagnosis not present

## 2024-10-23 LAB — GLUCOSE, CAPILLARY: Glucose-Capillary: 120 mg/dL — ABNORMAL HIGH (ref 70–99)

## 2024-10-23 LAB — COMPREHENSIVE METABOLIC PANEL WITH GFR
ALT: 21 U/L (ref 0–44)
AST: 24 U/L (ref 15–41)
Albumin: 3.8 g/dL (ref 3.5–5.0)
Alkaline Phosphatase: 308 U/L — ABNORMAL HIGH (ref 38–126)
Anion gap: 11 (ref 5–15)
BUN: 9 mg/dL (ref 8–23)
CO2: 25 mmol/L (ref 22–32)
Calcium: 8.4 mg/dL — ABNORMAL LOW (ref 8.9–10.3)
Chloride: 95 mmol/L — ABNORMAL LOW (ref 98–111)
Creatinine, Ser: 0.66 mg/dL (ref 0.61–1.24)
GFR, Estimated: >60 mL/min (ref >60)
Glucose, Bld: 99 mg/dL (ref 70–99)
Potassium: 4.7 mmol/L (ref 3.5–5.1)
Sodium: 131 mmol/L — ABNORMAL LOW (ref 135–145)
Total Bilirubin: 0.4 mg/dL (ref 0.0–1.2)
Total Protein: 7.9 g/dL (ref 6.5–8.1)

## 2024-10-23 LAB — CBC WITH DIFFERENTIAL/PLATELET
Abs Immature Granulocytes: 0.02 K/uL (ref 0.00–0.07)
Basophils Absolute: 0 K/uL (ref 0.0–0.1)
Basophils Relative: 0 %
Eosinophils Absolute: 0 K/uL (ref 0.0–0.5)
Eosinophils Relative: 1 %
HCT: 37 % — ABNORMAL LOW (ref 39.0–52.0)
Hemoglobin: 12.3 g/dL — ABNORMAL LOW (ref 13.0–17.0)
Immature Granulocytes: 0 %
Lymphocytes Relative: 21 %
Lymphs Abs: 1.2 K/uL (ref 0.7–4.0)
MCH: 27.1 pg (ref 26.0–34.0)
MCHC: 33.2 g/dL (ref 30.0–36.0)
MCV: 81.5 fL (ref 80.0–100.0)
Monocytes Absolute: 1 K/uL (ref 0.1–1.0)
Monocytes Relative: 17 %
Neutro Abs: 3.5 K/uL (ref 1.7–7.7)
Neutrophils Relative %: 61 %
Platelets: 381 K/uL (ref 150–400)
RBC: 4.54 MIL/uL (ref 4.22–5.81)
RDW: 20.4 % — ABNORMAL HIGH (ref 11.5–15.5)
WBC: 5.8 K/uL (ref 4.0–10.5)
nRBC: 0 % (ref 0.0–0.2)

## 2024-10-23 LAB — MAGNESIUM: Magnesium: 2.4 mg/dL (ref 1.7–2.4)

## 2024-10-23 MED ORDER — ONDANSETRON HCL 4 MG/2ML IJ SOLN
8.0000 mg | Freq: Once | INTRAMUSCULAR | Status: AC
  Administered 2024-10-23: 8 mg via INTRAVENOUS

## 2024-10-23 MED ORDER — ZOLEDRONIC ACID 4 MG/100ML IV SOLN
4.0000 mg | Freq: Once | INTRAVENOUS | Status: AC
  Administered 2024-10-23: 4 mg via INTRAVENOUS
  Filled 2024-10-23: qty 100

## 2024-10-23 MED ORDER — SODIUM CHLORIDE 0.9 % IV SOLN: Freq: Once | INTRAVENOUS | Status: DC

## 2024-10-23 MED ORDER — SODIUM CHLORIDE 0.9 % IV SOLN: INTRAVENOUS | Status: DC

## 2024-10-23 NOTE — Progress Notes (Signed)
 Message received from Isaiah Piety RN/Dr. Davonna okay to proceed with Zometa .  Treatment given today per MD orders.  Stable during infusion without adverse affects.  Vital signs stable.  No complaints at this time.  Discharge from clinic ambulatory in stable condition.  Alert and oriented X 3.  Follow up with [facility] as scheduled.

## 2024-10-23 NOTE — Patient Instructions (Signed)
 [facility] [place] - A DEPT OF [facility]  Discharge Instructions: Thank you for choosing [facility] to provide your oncology and hematology care.  If you have a lab appointment with the Cancer Center - please note that after April 8th, 2024, all labs will be drawn in the cancer center.  You do not have to check in or register with the main entrance as you have in the past but will complete your check-in in the cancer center.  Wear comfortable clothing and clothing appropriate for easy access to any Portacath or PICC line.   We strive to give you quality time with your provider. You may need to reschedule your appointment if you arrive late (15 or more minutes).  Arriving late affects you and other patients whose appointments are after yours.  Also, if you miss three or more appointments without notifying the office, you may be dismissed from the clinic at the provider's discretion.      For prescription refill requests, have your pharmacy contact our office and allow 72 hours for refills to be completed.    Today you received the following chemotherapy and/or immunotherapy agents Zometa , hydration      To help prevent nausea and vomiting after your treatment, we encourage you to take your nausea medication as directed.  BELOW ARE SYMPTOMS THAT SHOULD BE REPORTED IMMEDIATELY: *FEVER GREATER THAN 100.4 F (38 C) OR HIGHER *CHILLS OR SWEATING *NAUSEA AND VOMITING THAT IS NOT CONTROLLED WITH YOUR NAUSEA MEDICATION *UNUSUAL SHORTNESS OF BREATH *UNUSUAL BRUISING OR BLEEDING *URINARY PROBLEMS (pain or burning when urinating, or frequent urination) *BOWEL PROBLEMS (unusual diarrhea, constipation, pain near the anus) TENDERNESS IN MOUTH AND THROAT WITH OR WITHOUT PRESENCE OF ULCERS (sore throat, sores in mouth, or a toothache) UNUSUAL RASH, SWELLING OR PAIN  UNUSUAL VAGINAL DISCHARGE OR ITCHING   Items with * indicate a potential emergency and should be  followed up as soon as possible or go to the Emergency Department if any problems should occur.  Please show the CHEMOTHERAPY ALERT CARD or IMMUNOTHERAPY ALERT CARD at check-in to the Emergency Department and triage nurse.  Should you have questions after your visit or need to cancel or reschedule your appointment, please contact [facility] [place] - A DEPT OF [facility] [phone]  and follow the prompts.  Office hours are 8:00 a.m. to 4:30 p.m. Monday - Friday. Please note that voicemails left after 4:00 p.m. may not be returned until the following business day.  We are closed weekends and major holidays. You have access to a nurse at all times for urgent questions. Please call the main number to the clinic [phone] and follow the prompts.  For any non-urgent questions, you may also contact your provider using MyChart. We now offer e-Visits for anyone 72 and older to request care online for non-urgent symptoms. For details visit [email or web address].   Also download the MyChart app! Go to the app store, search MyChart, open the app, select [facility], and log in with your MyChart username and password.

## 2024-10-23 NOTE — Progress Notes (Signed)
 Patient presented for 9 am visit.  C/O being hot, weak and nauseous.  Hypotensive.  Blood sugar wnl.  IV started and 0.9 NS ran at 999 per Dr. Armanda order.  Patient is alert and oriented x 3.  Moved to infusion for fluids.  Dr. Davonna in room for assessment.

## 2024-10-23 NOTE — Patient Instructions (Addendum)
 [facility] Discharge Instructions   You were seen and examined today by Dr. Davonna.  She reviewed the results of your lab work which are normal/stable.   She reviewed the results of your PET scan which shows the cancer has gotten worse. We will need to change your treatment.   You have elected to go with hospice. We will make a referral.    Return as scheduled.    Thank you for choosing  Cancer Center at [facility] to provide your oncology and hematology care.  To afford each patient quality time with our provider, please arrive at least 15 minutes before your scheduled appointment time.   If you have a lab appointment with the Cancer Center please come in thru the Main Entrance and check in at the main information desk.  You need to re-schedule your appointment should you arrive 10 or more minutes late.  We strive to give you quality time with our providers, and arriving late affects you and other patients whose appointments are after yours.  Also, if you no show three or more times for appointments you may be dismissed from the clinic at the providers discretion.     Again, thank you for choosing [facility].  Our hope is that these requests will decrease the amount of time that you wait before being seen by our physicians.       _____________________________________________________________  Should you have questions after your visit to [facility], please contact our office at [phone] and follow the prompts.  Our office hours are 8:00 a.m. and 4:30 p.m. Monday - Friday.  Please note that voicemails left after 4:00 p.m. may not be returned until the following business day.  We are closed weekends and major holidays.  You do have access to a nurse 24-7, just call the main number to the clinic [phone] and do not press any options, hold on the line and a nurse will answer the phone.    For  prescription refill requests, have your pharmacy contact our office and allow 72 hours.    Due to Covid, you will need to wear a mask upon entering the hospital. If you do not have a mask, a mask will be given to you at the Main Entrance upon arrival. For doctor visits, patients may have 1 support person age 33 or older with them. For treatment visits, patients can not have anyone with them due to social distancing guidelines and our immunocompromised population.

## 2024-10-24 ENCOUNTER — Other Ambulatory Visit: Payer: Self-pay

## 2024-10-24 ENCOUNTER — Encounter: Payer: Self-pay | Admitting: Oncology

## 2024-10-24 NOTE — Progress Notes (Signed)
 Patient is transitioning to hospice and has stopped therapy.  Disenrolling from [facility].

## 2024-10-28 ENCOUNTER — Other Ambulatory Visit: Payer: Self-pay

## 2024-11-28 ENCOUNTER — Other Ambulatory Visit

## 2024-12-09 ENCOUNTER — Encounter: Payer: Self-pay | Admitting: *Deleted

## 2024-12-12 DEATH — deceased

## 2024-12-13 ENCOUNTER — Telehealth: Payer: Self-pay | Admitting: *Deleted

## 2024-12-13 NOTE — Telephone Encounter (Signed)
 CALLED PATIENT TO ASK ABOUT RESCHEDULING MISSED SCAN, VOICE MAIL WAS FULL, UNABLE TO LEAVE MESSAGE

## 2024-12-25 ENCOUNTER — Other Ambulatory Visit (HOSPITAL_COMMUNITY): Payer: Self-pay

## 2024-12-31 ENCOUNTER — Telehealth: Payer: Self-pay | Admitting: *Deleted

## 2024-12-31 NOTE — Telephone Encounter (Signed)
 CALLED PATIENT TO ASK ABOUT RESCHEDULING MISSED SCAN, VM FULL, UNABLE TO LEAVE MESSAGE
